# Patient Record
Sex: Male | Born: 1960 | Race: White | Hispanic: No | Marital: Married | Smoking: Former smoker
Health system: Southern US, Community
[De-identification: ages and names within clinical notes are randomized; demographics above are authoritative.]

## PROBLEM LIST (undated history)

## (undated) DIAGNOSIS — C4359 Malignant melanoma of other part of trunk: Secondary | ICD-10-CM

## (undated) HISTORY — PX: MOLE REMOVAL: SHX2046

## (undated) HISTORY — PX: SALIVARY GLAND SURGERY: SHX768

## (undated) HISTORY — DX: Malignant melanoma of other part of trunk: C43.59

---

## 2000-09-20 ENCOUNTER — Inpatient Hospital Stay (HOSPITAL_COMMUNITY): Admission: EM | Admit: 2000-09-20 | Discharge: 2000-09-21 | Payer: Self-pay | Admitting: Emergency Medicine

## 2020-01-06 DIAGNOSIS — C4359 Malignant melanoma of other part of trunk: Secondary | ICD-10-CM

## 2020-01-11 ENCOUNTER — Encounter: Payer: Self-pay | Admitting: Oncology

## 2020-01-11 DIAGNOSIS — C792 Secondary malignant neoplasm of skin: Secondary | ICD-10-CM | POA: Insufficient documentation

## 2020-01-11 DIAGNOSIS — C7931 Secondary malignant neoplasm of brain: Secondary | ICD-10-CM | POA: Insufficient documentation

## 2020-01-17 DIAGNOSIS — Z85820 Personal history of malignant melanoma of skin: Secondary | ICD-10-CM

## 2020-02-17 ENCOUNTER — Encounter: Payer: Self-pay | Admitting: Cardiology

## 2020-02-17 DIAGNOSIS — I4891 Unspecified atrial fibrillation: Secondary | ICD-10-CM

## 2020-02-17 DIAGNOSIS — R030 Elevated blood-pressure reading, without diagnosis of hypertension: Secondary | ICD-10-CM

## 2020-02-18 ENCOUNTER — Telehealth: Payer: Self-pay

## 2020-02-18 MED ORDER — METOPROLOL SUCCINATE ER 25 MG PO TB24: 12.5000 mg | ORAL_TABLET | Freq: Every day | ORAL | 3 refills | Status: DC

## 2020-02-18 NOTE — Telephone Encounter (Signed)
Left message for patient to return call.

## 2020-02-18 NOTE — Telephone Encounter (Signed)
We have no records in the chart of this patient has he been seen by [place]?

## 2020-02-18 NOTE — Telephone Encounter (Signed)
Spoke to patient and patients wife just now and let them know that per Dr. Joya Gaskins verbal order after reviewing [facility] records the metoprolol 12.5 mg daily at bedtime has been sent in for the patient. He also has an appointment to follow up with Dr. Harriet Masson on Wednesday.   Encouraged patient to call back with any questions or concerns.

## 2020-02-18 NOTE — Telephone Encounter (Signed)
Call acme in from patients wife [name] [phone]).  He was in yesterday for port placement and Dr. Harriet Masson was going to call in a medication for his BP.  I do not se anything listed in his chart.  Will you please advise as to what medication this is please?

## 2020-02-18 NOTE — Telephone Encounter (Signed)
We do not admit, follow instructions from hospital discharge, FU with Dr Harriet Masson Monday re message.

## 2020-02-19 DIAGNOSIS — C799 Secondary malignant neoplasm of unspecified site: Secondary | ICD-10-CM | POA: Diagnosis not present

## 2020-02-19 DIAGNOSIS — J189 Pneumonia, unspecified organism: Secondary | ICD-10-CM | POA: Diagnosis not present

## 2020-02-19 DIAGNOSIS — I119 Hypertensive heart disease without heart failure: Secondary | ICD-10-CM | POA: Diagnosis not present

## 2020-02-19 DIAGNOSIS — I4819 Other persistent atrial fibrillation: Secondary | ICD-10-CM | POA: Diagnosis not present

## 2020-02-20 ENCOUNTER — Encounter: Payer: Self-pay | Admitting: Oncology

## 2020-02-20 DIAGNOSIS — I4819 Other persistent atrial fibrillation: Secondary | ICD-10-CM | POA: Diagnosis not present

## 2020-02-20 DIAGNOSIS — C799 Secondary malignant neoplasm of unspecified site: Secondary | ICD-10-CM | POA: Diagnosis not present

## 2020-02-20 DIAGNOSIS — I429 Cardiomyopathy, unspecified: Secondary | ICD-10-CM

## 2020-02-20 DIAGNOSIS — C439 Malignant melanoma of skin, unspecified: Secondary | ICD-10-CM | POA: Insufficient documentation

## 2020-02-20 DIAGNOSIS — C4359 Malignant melanoma of other part of trunk: Secondary | ICD-10-CM

## 2020-02-20 DIAGNOSIS — J189 Pneumonia, unspecified organism: Secondary | ICD-10-CM | POA: Diagnosis not present

## 2020-02-20 DIAGNOSIS — I119 Hypertensive heart disease without heart failure: Secondary | ICD-10-CM | POA: Diagnosis not present

## 2020-02-20 HISTORY — DX: Malignant melanoma of other part of trunk: C43.59

## 2020-02-21 DIAGNOSIS — J189 Pneumonia, unspecified organism: Secondary | ICD-10-CM | POA: Diagnosis not present

## 2020-02-21 DIAGNOSIS — C799 Secondary malignant neoplasm of unspecified site: Secondary | ICD-10-CM | POA: Diagnosis not present

## 2020-02-21 DIAGNOSIS — I4819 Other persistent atrial fibrillation: Secondary | ICD-10-CM | POA: Diagnosis not present

## 2020-02-21 DIAGNOSIS — I119 Hypertensive heart disease without heart failure: Secondary | ICD-10-CM | POA: Diagnosis not present

## 2020-02-22 DIAGNOSIS — I429 Cardiomyopathy, unspecified: Secondary | ICD-10-CM

## 2020-02-22 DIAGNOSIS — I4891 Unspecified atrial fibrillation: Secondary | ICD-10-CM

## 2020-02-22 DIAGNOSIS — I119 Hypertensive heart disease without heart failure: Secondary | ICD-10-CM

## 2020-02-22 DIAGNOSIS — C799 Secondary malignant neoplasm of unspecified site: Secondary | ICD-10-CM

## 2020-02-23 ENCOUNTER — Ambulatory Visit: Payer: Self-pay | Admitting: Cardiology

## 2020-02-23 DIAGNOSIS — I4819 Other persistent atrial fibrillation: Secondary | ICD-10-CM | POA: Diagnosis not present

## 2020-02-23 DIAGNOSIS — I119 Hypertensive heart disease without heart failure: Secondary | ICD-10-CM | POA: Diagnosis not present

## 2020-02-23 DIAGNOSIS — C799 Secondary malignant neoplasm of unspecified site: Secondary | ICD-10-CM | POA: Diagnosis not present

## 2020-02-23 DIAGNOSIS — J189 Pneumonia, unspecified organism: Secondary | ICD-10-CM | POA: Diagnosis not present

## 2020-02-24 ENCOUNTER — Telehealth: Payer: Self-pay | Admitting: Cardiology

## 2020-02-24 NOTE — Telephone Encounter (Signed)
Pt c/o medication issue:  1. Name of Medication: Farxiga  2. How are you currently taking this medication (dosage and times per day)? n/a  3. Are you having a reaction (difficulty breathing--STAT)? no  4. What is your medication issue? Patient's wife states that Dr. Bettina Gavia wants the patient on this medication. She states that [employer] will not pay for it unless Dr. Bettina Gavia calls in and states that the patient needs to take this medication. Phone number [phone]

## 2020-02-24 NOTE — Telephone Encounter (Signed)
Attempted to call patient no answer and voicemail not set up.

## 2020-02-25 NOTE — Telephone Encounter (Signed)
Left message for patient to return call.

## 2020-02-25 NOTE — Telephone Encounter (Signed)
Pt's wife Cecille Rubin, calling stating that Dr. Bettina Gavia saw pt in [facility] and pt was put on 9 different medications. These medications are not listed on pt's medication list. The medications are Doristine Devoid, cardizem, doxycyline, Eliquis, Digoxin, metoprolol Tartrate, oxycodone and Tessalon. Pt's wife also stated that pt was given coupons for Minus Liberty and Delene Loll and the pt's pharmacy will not honor these coupons. Pt's wife would like a call concerning this matter at [phone]. Please address

## 2020-02-27 ENCOUNTER — Encounter: Payer: Self-pay | Admitting: Pharmacist

## 2020-02-27 DIAGNOSIS — C4359 Malignant melanoma of other part of trunk: Secondary | ICD-10-CM

## 2020-02-29 ENCOUNTER — Other Ambulatory Visit: Payer: Self-pay

## 2020-02-29 ENCOUNTER — Telehealth: Payer: Self-pay

## 2020-02-29 ENCOUNTER — Ambulatory Visit (INDEPENDENT_AMBULATORY_CARE_PROVIDER_SITE_OTHER): Payer: 59 | Admitting: Cardiology

## 2020-02-29 VITALS — BP 132/80 | HR 68 | Ht 63.0 in | Wt 163.4 lb

## 2020-02-29 DIAGNOSIS — R931 Abnormal findings on diagnostic imaging of heart and coronary circulation: Secondary | ICD-10-CM

## 2020-02-29 DIAGNOSIS — I4891 Unspecified atrial fibrillation: Secondary | ICD-10-CM

## 2020-02-29 DIAGNOSIS — R0989 Other specified symptoms and signs involving the circulatory and respiratory systems: Secondary | ICD-10-CM

## 2020-02-29 HISTORY — DX: Other specified symptoms and signs involving the circulatory and respiratory systems: R09.89

## 2020-02-29 HISTORY — DX: Abnormal findings on diagnostic imaging of heart and coronary circulation: R93.1

## 2020-02-29 HISTORY — DX: Unspecified atrial fibrillation: I48.91

## 2020-02-29 NOTE — Progress Notes (Signed)
Cardiology Office Note:    Date:  02/29/2020   ID:  Adam Cords., DOB Sep 11, 1960, MRN [id number]  PCP:  Patient, No Pcp Per  Cardiologist:  Adam Salines, DO  Electrophysiologist:  None   Referring MD: No ref. provider found   Follow up from hospitalization.  History of Present Illness:    Adam Furtick. is a 59 y.o. 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  Patient was seen here in the hospital after he developed fever postchemotherapy.  He stayed couple days in the hospital as he also did have some evidence of trace pleural effusion on his chest x-ray.  While in the hospital his medication was titrated and he was optimized with guideline directed therapy to Entresto 24/26 mg twice daily, Farxiga 10 mg daily, metoprolol tartrate 50 mg twice daily.  He also was continued on Cardizem, digoxin was added to his regimen for rate control and Eliquis for anticoagulation.  Patient is wife here today and they tell me that he has been doing well from a cardiovascular standpoint but unfortunately some of his medication had been denied by the insurance company.  He denies any chest pain, shortness of breath, lightheadedness or dizziness.  Past Medical History:  Diagnosis Date  . 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 MOLE REMOVAL    . SALIVARY GLAND SURGERY      Current Medications: Current Meds  Medication Sig  . benzonatate (TESSALON) 100 MG capsule Take 100 mg by mouth 3 (three) times daily as needed.  Marland Kitchen dexamethasone (DECADRON) 4 MG tablet Take 4 mg by mouth 2 (two) times daily.  . digoxin (LANOXIN) 0.125 MG tablet Take 125 mcg by mouth daily.  Marland Kitchen diltiazem (CARDIZEM) 30 MG tablet Take by mouth 3 (three) times  daily.  Marland Kitchen doxycycline (VIBRA-TABS) 100 MG tablet Take 100 mg by mouth 2 (two) times daily.  Marland Kitchen ELIQUIS 2.5 MG TABS tablet Take 2.5 mg by mouth 2 (two) times daily.  Marland Kitchen HYDROcodone-acetaminophen (NORCO/VICODIN) 5-325 MG tablet Take 1 tablet by mouth every 4 (four) hours as needed. for pain  . metoprolol tartrate (LOPRESSOR) 50 MG tablet Take 50 mg by mouth 2 (two) times daily.  Marland Kitchen oxyCODONE (OXY IR/ROXICODONE) 5 MG immediate release tablet Take 5 mg by mouth every 8 (eight) hours as needed.     Allergies:   Patient has no known allergies.   Social History   Socioeconomic History  . Marital status: Married    Spouse name: Not on file  . Number of children: Not on file  . Years of education: Not on file  . Highest education level: Not on file  Occupational History  . Not on file  Tobacco Use  . Smoking status: Former Research scientist (life sciences)  . Smokeless tobacco: Never Used  Substance and Sexual Activity  . Alcohol use: Not on file  . Drug use: Not on file  . Sexual activity: Not on file  Other Topics Concern  . Not on file  Social History Narrative  . Not on file   Social Determinants of Health   Financial Resource Strain:   . Difficulty of Paying Living Expenses: Not on file  Food Insecurity:   . Worried About Charity fundraiser in the  Last Year: Not on file  . Ran Out of Food in the Last Year: Not on file  Transportation Needs:   . Lack of Transportation (Medical): Not on file  . Lack of Transportation (Non-Medical): Not on file  Physical Activity:   . Days of Exercise per Week: Not on file  . Minutes of Exercise per Session: Not on file  Stress:   . Feeling of Stress : Not on file  Social Connections:   . Frequency of Communication with Friends and Family: Not on file  . Frequency of Social Gatherings with Friends and Family: Not on file  . Attends Religious Services: Not on file  . Active Member of Clubs or Organizations: Not on file  . Attends Archivist Meetings: Not on  file  . Marital Status: Not on file     Family History: The patient's family history includes Hypertension in his father and mother.  ROS:   Review of Systems  Constitution: Negative for decreased appetite, fever and weight gain.  HENT: Negative for congestion, ear discharge, hoarse voice and sore throat.   Eyes: Negative for discharge, redness, vision loss in right eye and visual halos.  Cardiovascular: Negative for chest pain, dyspnea on exertion, leg swelling, orthopnea and palpitations.  Respiratory: Negative for cough, hemoptysis, shortness of breath and snoring.   Endocrine: Negative for heat intolerance and polyphagia.  Hematologic/Lymphatic: Negative for bleeding problem. Does not bruise/bleed easily.  Skin: Negative for flushing, nail changes, rash and suspicious lesions.  Musculoskeletal: Negative for arthritis, joint pain, muscle cramps, myalgias, neck pain and stiffness.  Gastrointestinal: Negative for abdominal pain, bowel incontinence, diarrhea and excessive appetite.  Genitourinary: Negative for decreased libido, genital sores and incomplete emptying.  Neurological: Negative for brief paralysis, focal weakness, headaches and loss of balance.  Psychiatric/Behavioral: Negative for altered mental status, depression and suicidal ideas.  Allergic/Immunologic: Negative for HIV exposure and persistent infections.    EKGs/Labs/Other Studies Reviewed:    The following studies were reviewed today:   EKG: None today  Echocardiogram done at [facility] in September 2020 showed moderate impaired LV function 35 to 40%.  Right ventricle was normal in size and function.  Left atrium is normal in size.  Right atrium normal in size and function.  Mild aortic valve sclerosis.  Trace mitral regurgitation.  Trace tricuspid regurgitation.  Aortic root, ascending aorta all within normal.  Recent Labs: No results found for requested labs within last 8760 hours.  Recent Lipid  Panel No results found for: CHOL, TRIG, HDL, CHOLHDL, VLDL, LDLCALC, LDLDIRECT  Physical Exam:    VS:  BP 132/80   Pulse 68   Ht 5\' 3"  (1.6 m)   Wt 163 lb 6.4 oz (74.1 kg)   SpO2 96%   BMI 28.95 kg/m     Wt Readings from Last 3 Encounters:  02/29/20 163 lb 6.4 oz (74.1 kg)  02/27/20 162 lb (73.5 kg)     GEN: Well nourished, well developed in no acute distress HEENT: Normal NECK: No JVD; No carotid bruits LYMPHATICS: No lymphadenopathy CARDIAC: S1S2 noted,RRR, no murmurs, rubs, gallops RESPIRATORY:  Clear to auscultation without rales, wheezing or rhonchi  ABDOMEN: Soft, non-tender, non-distended, +bowel sounds, no guarding. EXTREMITIES: No edema, No cyanosis, no clubbing MUSCULOSKELETAL:  No deformity  SKIN: Warm and dry NEUROLOGIC:  Alert and oriented x 3, non-focal PSYCHIATRIC:  Normal affect, good insight  ASSESSMENT:    1. Atrial fibrillation, unspecified type ([place])   2. Depressed left ventricular ejection  fraction    PLAN:    The patient is currently on metoprolol tartrate 50 mg twice daily, Entresto 24-26 as well as Farxiga 10 milligrams daily I like to continue this medication for the patient.  Unfortunately his insurance company has pushed back on approving this medication.  I did speak to representative for his insurance company and they are going to call us back with information either on his approval or appeal process.  I have asked the patient to take his blood pressure at home daily as well.  In terms of his atrial fibrillation we will continue him on his current regimen rate control would be our strategy at this time.  Patient actively undergoing chemotherapy.  Plan for echocardiogram in 3 months once the patient is at his optimal titrated doses.  Blood work will be done today which includes digoxin level.  The patient is in agreement with the above plan. The patient left the office in stable condition.  The patient will follow up in 6 or sooner if  needed.   Medication Adjustments/Labs and Tests Ordered: Current medicines are reviewed at length with the patient today.  Concerns regarding medicines are outlined above.  Orders Placed This Encounter  Procedures  . Basic metabolic panel  . Magnesium  . Digoxin level   No orders of the defined types were placed in this encounter.   Patient Instructions  Medication Instructions:  No medication changes. *If you need a refill on your cardiac medications before your next appointment, please call your pharmacy*   Lab Work: Your physician recommends that you have labs done in the office today. Your test included  basic metabolic panel, digoxin and magnesium.  If you have labs (blood work) drawn today and your tests are completely normal, you will receive your results only by: Marland Kitchen MyChart Message (if you have MyChart) OR . A paper copy in the mail If you have any lab test that is abnormal or we need to change your treatment, we will call you to review the results.   Testing/Procedures: None ordered   Follow-Up: At [facility], you and your health needs are our priority.  As part of our continuing mission to provide you with exceptional heart care, we have created designated Provider Care Teams.  These Care Teams include your primary Cardiologist (physician) and Advanced Practice Providers (APPs -  Physician Assistants and Nurse Practitioners) who all work together to provide you with the care you need, when you need it.  We recommend signing up for the patient portal called "MyChart".  Sign up information is provided on this After Visit Summary.  MyChart is used to connect with patients for Virtual Visits (Telemedicine).  Patients are able to view lab/test results, encounter notes, upcoming appointments, etc.  Non-urgent messages can be sent to your provider as well.   To learn more about what you can do with MyChart, go to [email or web address].    Your next appointment:   6  week(s)  The format for your next appointment:   In Person  Provider:   Berniece Salines, DO   Other Instructions NA     Adopting a Healthy Lifestyle.  Know what a healthy weight is for you (roughly BMI <25) and aim to maintain this   Aim for 7+ servings of fruits and vegetables daily   65-80+ fluid ounces of water or unsweet tea for healthy kidneys   Limit to max 1 drink of alcohol per day; avoid smoking/tobacco   Limit animal  fats in diet for cholesterol and heart health - choose grass fed whenever available   Avoid highly processed foods, and foods high in saturated/trans fats   Aim for low stress - take time to unwind and care for your mental health   Aim for 150 min of moderate intensity exercise weekly for heart health, and weights twice weekly for bone health   Aim for 7-9 hours of sleep daily   When it comes to diets, agreement about the perfect plan isnt easy to find, even among the experts. Experts at the [employer] [employer] developed an idea known as the Healthy Eating Plate. Just imagine a plate divided into logical, healthy portions.   The emphasis is on diet quality:   Load up on vegetables and fruits - one-half of your plate: Aim for color and variety, and remember that potatoes dont count.   Go for whole grains - one-quarter of your plate: Whole wheat, barley, wheat berries, quinoa, oats, brown rice, and foods made with them. If you want pasta, go with whole wheat pasta.   Protein power - one-quarter of your plate: Fish, chicken, beans, and nuts are all healthy, versatile protein sources. Limit red meat.   The diet, however, does go beyond the plate, offering a few other suggestions.   Use healthy plant oils, such as olive, canola, soy, corn, sunflower and peanut. Check the labels, and avoid partially hydrogenated oil, which have unhealthy trans fats.   If youre thirsty, drink water. Coffee and tea are good in moderation, but skip sugary  drinks and limit milk and dairy products to one or two daily servings.   The type of carbohydrate in the diet is more important than the amount. Some sources of carbohydrates, such as vegetables, fruits, whole grains, and beans-are healthier than others.   Finally, stay active  Signed, Adam Salines, DO  02/29/2020 4:36 PM    [facility]

## 2020-02-29 NOTE — Telephone Encounter (Signed)
Merry Lofty aware that the PA was denied and that Dr. Harriet Masson has spoke with the insurance company.

## 2020-02-29 NOTE — Patient Instructions (Signed)
Medication Instructions:  No medication changes. *If you need a refill on your cardiac medications before your next appointment, please call your pharmacy*   Lab Work: Your physician recommends that you have labs done in the office today. Your test included  basic metabolic panel, digoxin and magnesium.  If you have labs (blood work) drawn today and your tests are completely normal, you will receive your results only by: Marland Kitchen MyChart Message (if you have MyChart) OR . A paper copy in the mail If you have any lab test that is abnormal or we need to change your treatment, we will call you to review the results.   Testing/Procedures: None ordered   Follow-Up: At [facility], you and your health needs are our priority.  As part of our continuing mission to provide you with exceptional heart care, we have created designated Provider Care Teams.  These Care Teams include your primary Cardiologist (physician) and Advanced Practice Providers (APPs -  Physician Assistants and Nurse Practitioners) who all work together to provide you with the care you need, when you need it.  We recommend signing up for the patient portal called "MyChart".  Sign up information is provided on this After Visit Summary.  MyChart is used to connect with patients for Virtual Visits (Telemedicine).  Patients are able to view lab/test results, encounter notes, upcoming appointments, etc.  Non-urgent messages can be sent to your provider as well.   To learn more about what you can do with MyChart, go to [email or web address].    Your next appointment:   6 week(s)  The format for your next appointment:   In Person  Provider:   Berniece Salines, DO   Other Instructions NA

## 2020-03-01 LAB — BASIC METABOLIC PANEL WITH GFR
BUN/Creatinine Ratio: 14 (ref 9–20)
BUN: 12 mg/dL (ref 6–24)
CO2: 24 mmol/L (ref 20–29)
Calcium: 9.2 mg/dL (ref 8.7–10.2)
Chloride: 110 mmol/L — ABNORMAL HIGH (ref 96–106)
Creatinine, Ser: 0.87 mg/dL (ref 0.76–1.27)
GFR calc Af Amer: 109 mL/min/{1.73_m2}
GFR calc non Af Amer: 94 mL/min/{1.73_m2}
Glucose: 87 mg/dL (ref 65–99)
Potassium: 4.5 mmol/L (ref 3.5–5.2)
Sodium: 145 mmol/L — ABNORMAL HIGH (ref 134–144)

## 2020-03-01 LAB — DIGOXIN LEVEL: Digoxin, Serum: 0.5 ng/mL (ref 0.5–0.9)

## 2020-03-01 LAB — MAGNESIUM: Magnesium: 2 mg/dL (ref 1.6–2.3)

## 2020-03-01 NOTE — Telephone Encounter (Signed)
Jade with [employer] is calling to follow up regarding PA for [place]. Please return call to discuss further at [phone].

## 2020-03-01 NOTE — Telephone Encounter (Signed)
Adam Bender is with med management with [facility] called in stated she would be able help assist with the PA   Best number [phone] ext [phone]

## 2020-03-02 ENCOUNTER — Telehealth: Payer: Self-pay

## 2020-03-02 MED ORDER — EMPAGLIFLOZIN 10 MG PO TABS: 10.0000 mg | ORAL_TABLET | Freq: Every day | ORAL | 6 refills | Status: DC

## 2020-03-02 NOTE — Addendum Note (Signed)
Addended by: Truddie Hidden on: 03/02/2020 03:20 PM   Modules accepted: Orders

## 2020-03-02 NOTE — Telephone Encounter (Signed)
Spoke with Adam Bender who states that we need to fax notes and echo to [phone] for the [facility] EOC# [id number]. Dr. Harriet Masson is ok with pt being on Jardiance instead of farxiga. Records printed and faxed. Called pt and made her aware of same.

## 2020-03-02 NOTE — Telephone Encounter (Signed)
Follow Up:     Returning Adam Bender's call from yesterday. Please call in the next hour, if not call afrom 2:00 to 3:00 please.

## 2020-03-03 NOTE — Telephone Encounter (Signed)
See additional encounters.

## 2020-03-09 ENCOUNTER — Other Ambulatory Visit: Payer: Self-pay | Admitting: Hematology and Oncology

## 2020-03-09 DIAGNOSIS — C7931 Secondary malignant neoplasm of brain: Secondary | ICD-10-CM

## 2020-03-09 DIAGNOSIS — C4359 Malignant melanoma of other part of trunk: Secondary | ICD-10-CM

## 2020-03-09 LAB — CBC AND DIFFERENTIAL
HCT: 45 (ref 41–53)
Hemoglobin: 14.9 (ref 13.5–17.5)
Neutrophils Absolute: 7340
Platelets: 349 (ref 150–399)
WBC: 13.6

## 2020-03-09 LAB — HEPATIC FUNCTION PANEL
ALT: 38 (ref 10–40)
AST: 31 (ref 14–40)
Alkaline Phosphatase: 123 (ref 25–125)
Bilirubin, Total: 0.1

## 2020-03-09 LAB — TSH: TSH: 1.15 (ref 0.41–5.90)

## 2020-03-09 LAB — CBC: RBC: 4.97 (ref 3.87–5.11)

## 2020-03-09 LAB — BASIC METABOLIC PANEL
BUN: 12 (ref 4–21)
CO2: 22 (ref 13–22)
Chloride: 107 (ref 99–108)
Creatinine: 1 (ref 0.6–1.3)
Glucose: 161
Potassium: 3.9 (ref 3.4–5.3)
Sodium: 141 (ref 137–147)

## 2020-03-09 LAB — COMPREHENSIVE METABOLIC PANEL
Albumin: 4.1 (ref 3.5–5.0)
Calcium: 9.4 (ref 8.7–10.7)

## 2020-03-10 ENCOUNTER — Inpatient Hospital Stay: Payer: 59 | Attending: Oncology

## 2020-03-10 ENCOUNTER — Other Ambulatory Visit: Payer: Self-pay

## 2020-03-10 VITALS — BP 130/76 | HR 52 | Temp 98.2°F | Resp 18 | Ht 68.0 in | Wt 161.8 lb

## 2020-03-10 DIAGNOSIS — Z5112 Encounter for antineoplastic immunotherapy: Secondary | ICD-10-CM | POA: Diagnosis not present

## 2020-03-10 DIAGNOSIS — C792 Secondary malignant neoplasm of skin: Secondary | ICD-10-CM | POA: Insufficient documentation

## 2020-03-10 DIAGNOSIS — C4359 Malignant melanoma of other part of trunk: Secondary | ICD-10-CM | POA: Insufficient documentation

## 2020-03-10 MED ORDER — SODIUM CHLORIDE 0.9 % IV SOLN
1.0000 mg/kg | Freq: Once | INTRAVENOUS | Status: AC
  Administered 2020-03-10: 74 mg via INTRAVENOUS
  Filled 2020-03-10: qty 7.4

## 2020-03-10 MED ORDER — DIPHENHYDRAMINE HCL 50 MG/ML IJ SOLN
25.0000 mg | Freq: Once | INTRAMUSCULAR | Status: AC
  Administered 2020-03-10: 25 mg via INTRAVENOUS

## 2020-03-10 MED ORDER — SODIUM CHLORIDE 0.9 % IV SOLN
Freq: Once | INTRAVENOUS | Status: AC
  Filled 2020-03-10: qty 250

## 2020-03-10 MED ORDER — FAMOTIDINE IN NACL 20-0.9 MG/50ML-% IV SOLN
INTRAVENOUS | Status: AC
  Filled 2020-03-10: qty 50

## 2020-03-10 MED ORDER — FAMOTIDINE IN NACL 20-0.9 MG/50ML-% IV SOLN
20.0000 mg | Freq: Once | INTRAVENOUS | Status: AC
  Administered 2020-03-10: 20 mg via INTRAVENOUS

## 2020-03-10 MED ORDER — HEPARIN SOD (PORK) LOCK FLUSH 100 UNIT/ML IV SOLN
500.0000 [IU] | Freq: Once | INTRAVENOUS | Status: AC | PRN
  Administered 2020-03-10: 500 [IU]
  Filled 2020-03-10: qty 5

## 2020-03-10 MED ORDER — SODIUM CHLORIDE 0.9 % IV SOLN
3.0000 mg/kg | Freq: Once | INTRAVENOUS | Status: AC
  Administered 2020-03-10: 220 mg via INTRAVENOUS
  Filled 2020-03-10: qty 40

## 2020-03-10 MED ORDER — SODIUM CHLORIDE 0.9% FLUSH
10.0000 mL | INTRAVENOUS | Status: DC | PRN
  Administered 2020-03-10: 10 mL
  Filled 2020-03-10: qty 10

## 2020-03-10 MED ORDER — DIPHENHYDRAMINE HCL 50 MG/ML IJ SOLN
INTRAMUSCULAR | Status: AC
  Filled 2020-03-10: qty 1

## 2020-03-10 NOTE — Patient Instructions (Signed)
[facility] Discharge Instructions for Patients Receiving Chemotherapy  Today you received the following chemotherapy agents:  Ipilimumab - Nivolumab  To help prevent nausea and vomiting after your treatment, we encourage you to take your nausea medication.   If you develop nausea and vomiting that is not controlled by your nausea medication, call the clinic.   BELOW ARE SYMPTOMS THAT SHOULD BE REPORTED IMMEDIATELY:  *FEVER GREATER THAN 100.5 F  *CHILLS WITH OR WITHOUT FEVER  NAUSEA AND VOMITING THAT IS NOT CONTROLLED WITH YOUR NAUSEA MEDICATION  *UNUSUAL SHORTNESS OF BREATH  *UNUSUAL BRUISING OR BLEEDING  TENDERNESS IN MOUTH AND THROAT WITH OR WITHOUT PRESENCE OF ULCERS  *URINARY PROBLEMS  *BOWEL PROBLEMS  UNUSUAL RASH Items with * indicate a potential emergency and should be followed up as soon as possible.  Feel free to call the clinic should you have any questions or concerns at The clinic phone number is [phone].  Please show the [place] at check-in to the Emergency Department and triage nurse.

## 2020-03-10 NOTE — Progress Notes (Signed)
Pt is stable at discharge. 

## 2020-03-20 ENCOUNTER — Telehealth: Payer: Self-pay | Admitting: Cardiology

## 2020-03-20 NOTE — Telephone Encounter (Signed)
Patients wife is requesting husband to switch from Dr. Harriet Masson to Dr. Bettina Gavia.

## 2020-03-20 NOTE — Telephone Encounter (Signed)
Okay, I assume this is another member of the [place] family and I have taken care of nearly all of them in my career

## 2020-03-20 NOTE — Telephone Encounter (Signed)
That will be fine with me. 

## 2020-03-23 NOTE — Progress Notes (Signed)
Cardiology Office Note:    Date:  03/24/2020   ID:  Adam Cords., DOB 03/22/1961, MRN [id number]  PCP:  Patient, No Pcp Per  Cardiologist:  Shirlee More, MD    Referring MD: No ref. provider found    ASSESSMENT:    1. Chronic systolic heart failure (HCC)   2. Persistent atrial fibrillation ([place])   3. Chronic anticoagulation   4. High risk medication use    PLAN:    In order of problems listed above:  1. he has developed decompensated heart failure since his hospitalization initiated diuretic recheck labs including proBNP initiate Entresto. 2. Rate controlled continue current treatment including digoxin check level his cardiomyopathy is due to atrial fibrillation I reinforced and the benefit of resuming sinus rhythm and I had asked him to come with a decision next visit continue his anticoagulant 3. Continue digoxin no evidence of toxicity   Next appointment: 3 weeks   Medication Adjustments/Labs and Tests Ordered: Current medicines are reviewed at length with the patient today.  Concerns regarding medicines are outlined above.  No orders of the defined types were placed in this encounter.  Meds ordered this encounter  Medications  . furosemide (LASIX) 20 MG tablet    Sig: Take 1 tablet (20 mg total) by mouth daily.    Dispense:  90 tablet    Refill:  3  . sacubitril-valsartan (ENTRESTO) 24-26 MG    Sig: Take 1 tablet by mouth 2 (two) times daily.    Dispense:  60 tablet    Refill:  3    Chief Complaint  Patient presents with  . Follow-up  . Atrial Fibrillation  . Cardiomyopathy    History of Present Illness:    Adam Grundman. is a 59 y.o. male with a hx of atrial fibrillation with cardiomyopathy EF 35 to 40%.  He has a history of melanoma and is receiving chemotherapy.He was  last seen 02/29/2020.  Compliance with diet, lifestyle and medications: Yes  Unfortunately never had preapproval for Entresto and has not taken valsartan.  His weight  is up 3 to 5 pounds he has peripheral edema he is short of breath he is in obvious decompensated heart failure he will start a loop diuretic and initiate Entresto with a new preapproval for chronic systolic heart failure.  Remains in atrial fibrillation hesitant except cardioversion strongly encouraged him to do that and when I see him back in 3 weeks in follow-up hopefully can get him scheduled he is compliant with his anticoagulant without bleeding complication has frequent labs at the cancer center and will ask for him to have a BMP proBNP digoxin level with his next labs on Thursday of next week.  No chest pain palpitation or syncope.  Echocardiogram done at [facility] in September 2021 showed moderate impaired LV function 35 to 40%. Right ventricle was normal in size and function.  Left atrium is normal in size.  Right atrium normal in size and function.  Mild aortic valve sclerosis.  Trace mitral regurgitation.  Trace tricuspid regurgitation.  Aortic root, ascending aorta all within normal.   Past Medical History:  Diagnosis Date  . Atrial fibrillation ([place]) 02/29/2020  . Depressed left ventricular ejection fraction 02/29/2020  . 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 MOLE REMOVAL    . SALIVARY GLAND SURGERY      Current Medications: Current Meds  Medication Sig  .  benzonatate (TESSALON) 100 MG capsule Take 100 mg by mouth 3 (three) times daily as needed.  . digoxin (LANOXIN) 0.125 MG tablet Take 125 mcg by mouth daily.  Marland Kitchen diltiazem (CARDIZEM) 30 MG tablet Take by mouth 3 (three) times daily.  Marland Kitchen ELIQUIS 2.5 MG TABS tablet Take 2.5 mg by mouth 2 (two) times daily.  . empagliflozin (JARDIANCE) 10 MG TABS tablet Take 1 tablet (10 mg total) by mouth daily before breakfast.  . HYDROcodone-acetaminophen (NORCO/VICODIN) 5-325 MG tablet Take 1 tablet by mouth every 4 (four) hours as needed. for pain  . metoprolol  tartrate (LOPRESSOR) 50 MG tablet Take 50 mg by mouth 2 (two) times daily.  Marland Kitchen oxyCODONE (OXY IR/ROXICODONE) 5 MG immediate release tablet Take 5 mg by mouth every 8 (eight) hours as needed.     Allergies:   Patient has no known allergies.   Social History   Socioeconomic History  . Marital status: Married    Spouse name: Not on file  . Number of children: Not on file  . Years of education: Not on file  . Highest education level: Not on file  Occupational History  . Not on file  Tobacco Use  . Smoking status: Former Research scientist (life sciences)  . Smokeless tobacco: Never Used  Substance and Sexual Activity  . Alcohol use: Not on file  . Drug use: Not on file  . Sexual activity: Not on file  Other Topics Concern  . Not on file  Social History Narrative  . Not on file   Social Determinants of Health   Financial Resource Strain:   . Difficulty of Paying Living Expenses: Not on file  Food Insecurity:   . Worried About Charity fundraiser in the Last Year: Not on file  . Ran Out of Food in the Last Year: Not on file  Transportation Needs:   . Lack of Transportation (Medical): Not on file  . Lack of Transportation (Non-Medical): Not on file  Physical Activity:   . Days of Exercise per Week: Not on file  . Minutes of Exercise per Session: Not on file  Stress:   . Feeling of Stress : Not on file  Social Connections:   . Frequency of Communication with Friends and Family: Not on file  . Frequency of Social Gatherings with Friends and Family: Not on file  . Attends Religious Services: Not on file  . Active Member of Clubs or Organizations: Not on file  . Attends Archivist Meetings: Not on file  . Marital Status: Not on file     Family History: The patient's family history includes Hypertension in his father and mother. ROS:   Please see the history of present illness.    All other systems reviewed and are negative.  EKGs/Labs/Other Studies Reviewed:    The following studies  were reviewed today:    Recent Labs: 02/29/2020: Magnesium 2.0 03/09/2020: ALT 38; BUN 12; Creatinine 1.0; Hemoglobin 14.9; Platelets 349; Potassium 3.9; Sodium 141; TSH 1.15  Recent Lipid Panel No results found for: CHOL, TRIG, HDL, CHOLHDL, VLDL, LDLCALC, LDLDIRECT  Physical Exam:    VS:  BP 122/74 (BP Location: Right Arm, Patient Position: Sitting, Cuff Size: Normal)   Pulse 92   Ht 5\' 3"  (1.6 m)   Wt 166 lb 12.8 oz (75.7 kg)   SpO2 97%   BMI 29.55 kg/m     Wt Readings from Last 3 Encounters:  03/24/20 166 lb 12.8 oz (75.7 kg)  03/20/20 162 lb  9 oz (73.7 kg)  03/10/20 161 lb 12.8 oz (73.4 kg)     GEN:  Well nourished, well developed in no acute distress HEENT: Normal NECK: No JVD; No carotid bruits LYMPHATICS: No lymphadenopathy CARDIAC: Irregular rhythm S1 variable RRR, no murmurs, rubs, gallops RESPIRATORY:  Clear to auscultation without rales, wheezing or rhonchi  ABDOMEN: Soft, non-tender, non-distended MUSCULOSKELETAL: 2+ bilateral lower extremity ankle to knee pitting edema; No deformity  SKIN: Warm and dry NEUROLOGIC:  Alert and oriented x 3 PSYCHIATRIC:  Normal affect    Signed, Shirlee More, MD  03/24/2020 3:55 PM    [facility]

## 2020-03-24 ENCOUNTER — Ambulatory Visit: Payer: 59 | Admitting: Cardiology

## 2020-03-24 ENCOUNTER — Other Ambulatory Visit: Payer: Self-pay

## 2020-03-24 ENCOUNTER — Ambulatory Visit (INDEPENDENT_AMBULATORY_CARE_PROVIDER_SITE_OTHER): Payer: 59 | Admitting: Cardiology

## 2020-03-24 ENCOUNTER — Encounter: Payer: Self-pay | Admitting: Cardiology

## 2020-03-24 VITALS — BP 122/74 | HR 92 | Ht 63.0 in | Wt 166.8 lb

## 2020-03-24 DIAGNOSIS — I4819 Other persistent atrial fibrillation: Secondary | ICD-10-CM

## 2020-03-24 DIAGNOSIS — Z7901 Long term (current) use of anticoagulants: Secondary | ICD-10-CM

## 2020-03-24 DIAGNOSIS — I5022 Chronic systolic (congestive) heart failure: Secondary | ICD-10-CM | POA: Diagnosis not present

## 2020-03-24 DIAGNOSIS — Z79899 Other long term (current) drug therapy: Secondary | ICD-10-CM | POA: Diagnosis not present

## 2020-03-24 MED ORDER — FUROSEMIDE 20 MG PO TABS: 20.0000 mg | ORAL_TABLET | Freq: Every day | ORAL | 3 refills | Status: DC

## 2020-03-24 MED ORDER — ENTRESTO 24-26 MG PO TABS: 1.0000 | ORAL_TABLET | Freq: Two times a day (BID) | ORAL | 3 refills | Status: DC

## 2020-03-24 NOTE — Patient Instructions (Addendum)
Medication Instructions:  Your physician has recommended you make the following change in your medication:  START: Furosemide 20 mg take one tablet by mouth daily.  START: Entresto 24/26 mg take one tablet by mouth twice daily.   *If you need a refill on your cardiac medications before your next appointment, please call your pharmacy*   Lab Work: Please get your lab work completed at [facility] on Thursday.  If you have labs (blood work) drawn today and your tests are completely normal, you will receive your results only by: Marland Kitchen MyChart Message (if you have MyChart) OR . A paper copy in the mail If you have any lab test that is abnormal or we need to change your treatment, we will call you to review the results.   Testing/Procedures: None   Follow-Up: At [facility], you and your health needs are our priority.  As part of our continuing mission to provide you with exceptional heart care, we have created designated Provider Care Teams.  These Care Teams include your primary Cardiologist (physician) and Advanced Practice Providers (APPs -  Physician Assistants and Nurse Practitioners) who all work together to provide you with the care you need, when you need it.  We recommend signing up for the patient portal called "MyChart".  Sign up information is provided on this After Visit Summary.  MyChart is used to connect with patients for Virtual Visits (Telemedicine).  Patients are able to view lab/test results, encounter notes, upcoming appointments, etc.  Non-urgent messages can be sent to your provider as well.   To learn more about what you can do with MyChart, go to [email or web address].    Your next appointment:   3 week(s)  The format for your next appointment:   In Person  Provider:   Shirlee More, MD   Other Instructions

## 2020-03-28 ENCOUNTER — Telehealth: Payer: Self-pay | Admitting: *Deleted

## 2020-03-28 MED ORDER — DIGOXIN 125 MCG PO TABS
125.0000 ug | ORAL_TABLET | Freq: Every day | ORAL | 5 refills | Status: DC
Start: 2020-03-28 — End: 2020-07-24

## 2020-03-28 NOTE — Telephone Encounter (Signed)
Rx refill sent to pharmacy. 

## 2020-03-30 ENCOUNTER — Other Ambulatory Visit: Payer: Self-pay | Admitting: Hematology and Oncology

## 2020-03-30 DIAGNOSIS — C7931 Secondary malignant neoplasm of brain: Secondary | ICD-10-CM | POA: Diagnosis not present

## 2020-03-30 DIAGNOSIS — C4359 Malignant melanoma of other part of trunk: Secondary | ICD-10-CM | POA: Diagnosis not present

## 2020-03-30 LAB — HEPATIC FUNCTION PANEL
ALT: 32 (ref 10–40)
AST: 34 (ref 14–40)
Alkaline Phosphatase: 129 — AB (ref 25–125)
Bilirubin, Total: 0.7

## 2020-03-30 LAB — BASIC METABOLIC PANEL
BUN: 10 (ref 4–21)
CO2: 19 (ref 13–22)
Chloride: 110 — AB (ref 99–108)
Creatinine: 0.9 (ref 0.6–1.3)
Glucose: 107
Potassium: 4.1 (ref 3.4–5.3)
Sodium: 140 (ref 137–147)

## 2020-03-30 LAB — CBC: RBC: 5.5 — AB (ref 3.87–5.11)

## 2020-03-30 LAB — COMPREHENSIVE METABOLIC PANEL
Albumin: 4.2 (ref 3.5–5.0)
Calcium: 9.1 (ref 8.7–10.7)

## 2020-03-30 LAB — CBC AND DIFFERENTIAL
HCT: 50 (ref 41–53)
Hemoglobin: 16.3 (ref 13.5–17.5)
Neutrophils Absolute: 6.43
Platelets: 334 (ref 150–399)
WBC: 12.6

## 2020-03-30 LAB — TSH: TSH: 2.82 (ref 0.41–5.90)

## 2020-03-31 ENCOUNTER — Other Ambulatory Visit: Payer: Self-pay

## 2020-03-31 ENCOUNTER — Inpatient Hospital Stay: Payer: 59

## 2020-03-31 VITALS — BP 116/75 | HR 84 | Temp 98.2°F | Resp 18 | Ht 63.0 in | Wt 166.2 lb

## 2020-03-31 DIAGNOSIS — Z5112 Encounter for antineoplastic immunotherapy: Secondary | ICD-10-CM | POA: Diagnosis not present

## 2020-03-31 DIAGNOSIS — C4359 Malignant melanoma of other part of trunk: Secondary | ICD-10-CM

## 2020-03-31 MED ORDER — DIPHENHYDRAMINE HCL 50 MG/ML IJ SOLN
INTRAMUSCULAR | Status: AC
  Filled 2020-03-31: qty 1

## 2020-03-31 MED ORDER — SODIUM CHLORIDE 0.9 % IV SOLN
3.0000 mg/kg | Freq: Once | INTRAVENOUS | Status: AC
  Administered 2020-03-31: 220 mg via INTRAVENOUS
  Filled 2020-03-31: qty 40

## 2020-03-31 MED ORDER — FAMOTIDINE IN NACL 20-0.9 MG/50ML-% IV SOLN
20.0000 mg | Freq: Once | INTRAVENOUS | Status: AC
  Administered 2020-03-31: 20 mg via INTRAVENOUS

## 2020-03-31 MED ORDER — SODIUM CHLORIDE 0.9 % IV SOLN
Freq: Once | INTRAVENOUS | Status: AC
  Filled 2020-03-31: qty 250

## 2020-03-31 MED ORDER — SODIUM CHLORIDE 0.9% FLUSH
10.0000 mL | INTRAVENOUS | Status: DC | PRN
  Administered 2020-03-31: 10 mL
  Filled 2020-03-31: qty 10

## 2020-03-31 MED ORDER — SODIUM CHLORIDE 0.9 % IV SOLN
1.0000 mg/kg | Freq: Once | INTRAVENOUS | Status: AC
  Administered 2020-03-31: 74 mg via INTRAVENOUS
  Filled 2020-03-31: qty 7.4

## 2020-03-31 MED ORDER — HEPARIN SOD (PORK) LOCK FLUSH 100 UNIT/ML IV SOLN
500.0000 [IU] | Freq: Once | INTRAVENOUS | Status: AC | PRN
  Administered 2020-03-31: 500 [IU]
  Filled 2020-03-31: qty 5

## 2020-03-31 MED ORDER — DIPHENHYDRAMINE HCL 50 MG/ML IJ SOLN
25.0000 mg | Freq: Once | INTRAMUSCULAR | Status: AC
  Administered 2020-03-31: 25 mg via INTRAVENOUS

## 2020-03-31 MED ORDER — FAMOTIDINE IN NACL 20-0.9 MG/50ML-% IV SOLN
INTRAVENOUS | Status: AC
  Filled 2020-03-31: qty 50

## 2020-03-31 NOTE — Patient Instructions (Signed)
[place] Discharge Instructions for Patients Receiving Chemotherapy  Today you received the following chemotherapy agents Nivolumab, Ipilimumab  To help prevent nausea and vomiting after your treatment, we encourage you to take your nausea medication.Ipilimumab injection What is this medicine? IPILIMUMAB (IP i LIM ue mab) is a monoclonal antibody. It is used to treat colorectal cancer, kidney cancer, liver cancer, lung cancer, melanoma, and mesothelioma. This medicine may be used for other purposes; ask your health care provider or pharmacist if you have questions. COMMON BRAND NAME(S): YERVOY What should I tell my health care provider before I take this medicine? They need to know if you have any of these conditions:  Addison's disease  blood in your stools (black or tarry stools) or if you have blood in your vomit  eye disease, vision problems  history of pancreatitis  history of stomach bleeding  immune system problems  inflammatory bowel disease  kidney disease  liver disease  lupus  myasthenia gravis  organ transplant  rheumatoid arthritis  sarcoidosis  stomach or intestine problems  thyroid disease  tingling of the fingers or toes, or other nerve disorder  an unusual or allergic reaction to ipilimumab, other medicines, foods, dyes, or preservatives  pregnant or trying to get pregnant  breast-feeding How should I use this medicine? This medicine is for infusion into a vein. It is given by a health care professional in a hospital or clinic setting. A special MedGuide will be given to you before each treatment. Be sure to read this information carefully each time. Talk to your pediatrician regarding the use of this medicine in children. While this drug may be prescribed for children as young as 12 years for selected conditions, precautions do apply. Overdosage: If you think you have taken too much of this medicine contact a poison  control center or emergency room at once. NOTE: This medicine is only for you. Do not share this medicine with others. What if I miss a dose? It is important not to miss your dose. Call your doctor or health care professional if you are unable to keep an appointment. What may interact with this medicine? Interactions are not expected. This list may not describe all possible interactions. Give your health care provider a list of all the medicines, herbs, non-prescription drugs, or dietary supplements you use. Also tell them if you smoke, drink alcohol, or use illegal drugs. Some items may interact with your medicine. What should I watch for while using this medicine? Tell your doctor or healthcare professional if your symptoms do not start to get better or if they get worse. Do not become pregnant while taking this medicine or for 3 months after stopping it. Women should inform their doctor if they wish to become pregnant or think they might be pregnant. There is a potential for serious side effects to an unborn child. Talk to your health care professional or pharmacist for more information. Do not breast-feed an infant while taking this medicine or for 3 months after the last dose. Your condition will be monitored carefully while you are receiving this medicine. You may need blood work done while you are taking this medicine. What side effects may I notice from receiving this medicine? Side effects that you should report to your doctor or health care professional as soon as possible:  allergic reactions like skin rash, itching or hives, swelling of the face, lips, or tongue  black, tarry stools  bloody or watery diarrhea  changes in vision  dizziness  eye pain  fast, irregular heartbeat  feeling anxious  feeling faint or lightheaded, falls  nausea, vomiting  pain, tingling, numbness in the hands or feet  redness, blistering, peeling or loosening of the skin, including inside the  mouth  signs and symptoms of liver injury like dark yellow or brown urine; general ill feeling or flu-like symptoms; light-colored stools; loss of appetite; nausea; right upper belly pain; unusually weak or tired; yellowing of the eyes or skin  unusual bleeding or bruising Side effects that usually do not require medical attention (report to your doctor or health care professional if they continue or are bothersome):  headache  loss of appetite  trouble sleeping This list may not describe all possible side effects. Call your doctor for medical advice about side effects. You may report side effects to FDA at 1-800-FDA-1088. Where should I keep my medicine? This drug is given in a hospital or clinic and will not be stored at home. NOTE: This sheet is a summary. It may not cover all possible information. If you have questions about this medicine, talk to your doctor, pharmacist, or health care provider.  2020 Elsevier/Gold Standard (2019-03-09 10:56:55) Nivolumab injection What is this medicine? NIVOLUMAB (nye VOL ue mab) is a monoclonal antibody. It is used to treat colon cancer, esophageal cancer, head and neck cancer, Hodgkin lymphoma, kidney cancer, liver cancer, lung cancer, mesothelioma, melanoma, and urothelial cancer. This medicine may be used for other purposes; ask your health care provider or pharmacist if you have questions. COMMON BRAND NAME(S): Opdivo What should I tell my health care provider before I take this medicine? They need to know if you have any of these conditions:  diabetes  immune system problems  kidney disease  liver disease  lung disease  organ transplant  stomach or intestine problems  thyroid disease  an unusual or allergic reaction to nivolumab, other medicines, foods, dyes, or preservatives  pregnant or trying to get pregnant  breast-feeding How should I use this medicine? This medicine is for infusion into a vein. It is given by a health  care professional in a hospital or clinic setting. A special MedGuide will be given to you before each treatment. Be sure to read this information carefully each time. Talk to your pediatrician regarding the use of this medicine in children. While this drug may be prescribed for children as young as 12 years for selected conditions, precautions do apply. Overdosage: If you think you have taken too much of this medicine contact a poison control center or emergency room at once. NOTE: This medicine is only for you. Do not share this medicine with others. What if I miss a dose? It is important not to miss your dose. Call your doctor or health care professional if you are unable to keep an appointment. What may interact with this medicine? Interactions have not been studied. Give your health care provider a list of all the medicines, herbs, non-prescription drugs, or dietary supplements you use. Also tell them if you smoke, drink alcohol, or use illegal drugs. Some items may interact with your medicine. This list may not describe all possible interactions. Give your health care provider a list of all the medicines, herbs, non-prescription drugs, or dietary supplements you use. Also tell them if you smoke, drink alcohol, or use illegal drugs. Some items may interact with your medicine. What should I watch for while using this medicine? This drug may make you feel generally  unwell. Continue your course of treatment even though you feel ill unless your doctor tells you to stop. You may need blood work done while you are taking this medicine. Do not become pregnant while taking this medicine or for 5 months after stopping it. Women should inform their doctor if they wish to become pregnant or think they might be pregnant. There is a potential for serious side effects to an unborn child. Talk to your health care professional or pharmacist for more information. Do not breast-feed an infant while taking this  medicine or for 5 months after stopping it. What side effects may I notice from receiving this medicine? Side effects that you should report to your doctor or health care professional as soon as possible:  allergic reactions like skin rash, itching or hives, swelling of the face, lips, or tongue  breathing problems  blood in the urine  bloody or watery diarrhea or black, tarry stools  changes in emotions or moods  changes in vision  chest pain  cough  dizziness  feeling faint or lightheaded, falls  fever, chills  headache with fever, neck stiffness, confusion, loss of memory, sensitivity to light, hallucination, loss of contact with reality, or seizures  joint pain  mouth sores  redness, blistering, peeling or loosening of the skin, including inside the mouth  severe muscle pain or weakness  signs and symptoms of high blood sugar such as dizziness; dry mouth; dry skin; fruity breath; nausea; stomach pain; increased hunger or thirst; increased urination  signs and symptoms of kidney injury like trouble passing urine or change in the amount of urine  signs and symptoms of liver injury like dark yellow or brown urine; general ill feeling or flu-like symptoms; light-colored stools; loss of appetite; nausea; right upper belly pain; unusually weak or tired; yellowing of the eyes or skin  swelling of the ankles, feet, hands  trouble passing urine or change in the amount of urine  unusually weak or tired  weight gain or loss Side effects that usually do not require medical attention (report to your doctor or health care professional if they continue or are bothersome):  bone pain  constipation  decreased appetite  diarrhea  muscle pain  nausea, vomiting  tiredness This list may not describe all possible side effects. Call your doctor for medical advice about side effects. You may report side effects to FDA at 1-800-FDA-1088. Where should I keep my  medicine? This drug is given in a hospital or clinic and will not be stored at home. NOTE: This sheet is a summary. It may not cover all possible information. If you have questions about this medicine, talk to your doctor, pharmacist, or health care provider.  2020 Elsevier/Gold Standard (2019-03-09 10:04:50)    If you develop nausea and vomiting that is not controlled by your nausea medication, call the clinic.   BELOW ARE SYMPTOMS THAT SHOULD BE REPORTED IMMEDIATELY:  *FEVER GREATER THAN 100.5 F  *CHILLS WITH OR WITHOUT FEVER  NAUSEA AND VOMITING THAT IS NOT CONTROLLED WITH YOUR NAUSEA MEDICATION  *UNUSUAL SHORTNESS OF BREATH  *UNUSUAL BRUISING OR BLEEDING  TENDERNESS IN MOUTH AND THROAT WITH OR WITHOUT PRESENCE OF ULCERS  *URINARY PROBLEMS  *BOWEL PROBLEMS  UNUSUAL RASH Items with * indicate a potential emergency and should be followed up as soon as possible.  Feel free to call the clinic should you have any questions or concerns at The clinic phone number is [phone].  Please show the [facility]  CARD at check-in to the Emergency Department and triage nurse.

## 2020-03-31 NOTE — Progress Notes (Signed)
Pt stable at time of discharge. 

## 2020-04-07 ENCOUNTER — Ambulatory Visit: Payer: 59 | Admitting: Cardiology

## 2020-04-11 ENCOUNTER — Telehealth: Payer: Self-pay

## 2020-04-11 NOTE — Telephone Encounter (Addendum)
Pts wife, [name] called called to report that pt has developed a new pain under left arm. She is very concerned that he needs to be seen before his scheduled appt w/Melissa (which is to look at increasing size of moles on left shoulder & lower back). I sent In basket message to Dr Hinton Rao.  Derwood Kaplan, MD  Dairl Ponder, RN I thought I sent message back yest but can't find any. If you can find opening, bring in sooner    I notified Gay Filler, to look for earlier pt appt. Pt's number [phone]

## 2020-04-11 NOTE — Progress Notes (Deleted)
Cardiology Office Note:    Date:  04/11/2020   ID:  Kathrine Cords., DOB 19-Mar-1961, MRN [id number]  PCP:  Patient, No Pcp Per  Cardiologist:  Shirlee More, MD    Referring MD: No ref. provider found    ASSESSMENT:    No diagnosis found. PLAN:    In order of problems listed above:  1. ***   Next appointment: ***   Medication Adjustments/Labs and Tests Ordered: Current medicines are reviewed at length with the patient today.  Concerns regarding medicines are outlined above.  No orders of the defined types were placed in this encounter.  No orders of the defined types were placed in this encounter.   No chief complaint on file.   History of Present Illness:    Tykeem Lanzer. is a 59 y.o. male with a hx of persistent atrial fibrillation anticoagulated on digoxin with cardiomyopathy EF 35 to 40% melanoma with chemotherapy last seen 03/24/2020 with recurrent decompensated heart failure associated with interruption of guideline directed treatment due to financial restriction.. Compliance with diet, lifestyle and medications: *** Past Medical History:  Diagnosis Date  . Atrial fibrillation ([place]) 02/29/2020  . Depressed left ventricular ejection fraction 02/29/2020  . 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 MOLE REMOVAL    . SALIVARY GLAND SURGERY      Current Medications: No outpatient medications have been marked as taking for the 04/12/20 encounter (Appointment) with Richardo Priest, MD.     Allergies:   Patient has no known allergies.   Social History   Socioeconomic History  . Marital status: Married    Spouse name: Not on file  . Number of children: Not on file  . Years of education: Not on file  . Highest education level: Not on file  Occupational History  . Not on file  Tobacco Use  . Smoking status: Former Research scientist (life sciences)  . Smokeless tobacco: Never Used  Substance and  Sexual Activity  . Alcohol use: Not on file  . Drug use: Not on file  . Sexual activity: Not on file  Other Topics Concern  . Not on file  Social History Narrative  . Not on file   Social Determinants of Health   Financial Resource Strain:   . Difficulty of Paying Living Expenses: Not on file  Food Insecurity:   . Worried About Charity fundraiser in the Last Year: Not on file  . Ran Out of Food in the Last Year: Not on file  Transportation Needs:   . Lack of Transportation (Medical): Not on file  . Lack of Transportation (Non-Medical): Not on file  Physical Activity:   . Days of Exercise per Week: Not on file  . Minutes of Exercise per Session: Not on file  Stress:   . Feeling of Stress : Not on file  Social Connections:   . Frequency of Communication with Friends and Family: Not on file  . Frequency of Social Gatherings with Friends and Family: Not on file  . Attends Religious Services: Not on file  . Active Member of Clubs or Organizations: Not on file  . Attends Archivist Meetings: Not on file  . Marital Status: Not on file     Family History: The patient's ***family history includes Hypertension in his father and mother. ROS:   Please see the history of present illness.    All other  systems reviewed and are negative.  EKGs/Labs/Other Studies Reviewed:    The following studies were reviewed today:  EKG:  EKG ordered today and personally reviewed.  The ekg ordered today demonstrates ***  Recent Labs: 02/29/2020: Magnesium 2.0 03/30/2020: ALT 32; BUN 10; Creatinine 0.9; Hemoglobin 16.3; Platelets 334; Potassium 4.1; Sodium 140; TSH 2.82  Recent Lipid Panel No results found for: CHOL, TRIG, HDL, CHOLHDL, VLDL, LDLCALC, LDLDIRECT  Physical Exam:    VS:  There were no vitals taken for this visit.    Wt Readings from Last 3 Encounters:  03/31/20 166 lb 4 oz (75.4 kg)  03/24/20 166 lb 12.8 oz (75.7 kg)  03/20/20 162 lb 9 oz (73.7 kg)     GEN: ***  Well nourished, well developed in no acute distress HEENT: Normal NECK: No JVD; No carotid bruits LYMPHATICS: No lymphadenopathy CARDIAC: ***RRR, no murmurs, rubs, gallops RESPIRATORY:  Clear to auscultation without rales, wheezing or rhonchi  ABDOMEN: Soft, non-tender, non-distended MUSCULOSKELETAL:  No edema; No deformity  SKIN: Warm and dry NEUROLOGIC:  Alert and oriented x 3 PSYCHIATRIC:  Normal affect    Signed, Shirlee More, MD  04/11/2020 2:45 PM    [facility]

## 2020-04-12 ENCOUNTER — Other Ambulatory Visit: Payer: Self-pay

## 2020-04-12 ENCOUNTER — Ambulatory Visit (INDEPENDENT_AMBULATORY_CARE_PROVIDER_SITE_OTHER): Payer: 59 | Admitting: Cardiology

## 2020-04-12 ENCOUNTER — Encounter: Payer: Self-pay | Admitting: Cardiology

## 2020-04-12 ENCOUNTER — Ambulatory Visit: Payer: 59 | Admitting: Cardiology

## 2020-04-12 VITALS — BP 106/82 | HR 62 | Ht 63.0 in | Wt 165.1 lb

## 2020-04-12 DIAGNOSIS — I5022 Chronic systolic (congestive) heart failure: Secondary | ICD-10-CM

## 2020-04-12 DIAGNOSIS — Z7901 Long term (current) use of anticoagulants: Secondary | ICD-10-CM | POA: Diagnosis not present

## 2020-04-12 DIAGNOSIS — Z79899 Other long term (current) drug therapy: Secondary | ICD-10-CM | POA: Diagnosis not present

## 2020-04-12 DIAGNOSIS — I4819 Other persistent atrial fibrillation: Secondary | ICD-10-CM | POA: Diagnosis not present

## 2020-04-12 NOTE — Telephone Encounter (Addendum)
-----   Message from Derwood Kaplan, MD sent at 04/12/2020  1:37 PM EST ----- Regarding: RE: New pain under left arm. I thought I sent message back yest but can't find any.  If you can find opening, bring in sooner ----- Message ----- From: Dairl Ponder, RN Sent: 04/11/2020   2:10 PM EST To: Derwood Kaplan, MD Subject: New pain under left arm.                       Pt's wife, [name], called & is concerned. Pt has new pain under his left arm. The moles on his shoulder and lower back are larger also. He has appt to see Melissa,NP, on the 16th for the moles to be assessed. However, she is very concerned about the new pain & wonders if he needs to be seen  before? [phone]  Pamala Hurry was able to get appt for pt tomorrow @ 3p for labs, 330p with Kelli,PA. I called pt's wife & confirmed appt.

## 2020-04-12 NOTE — Progress Notes (Signed)
Cardiology Office Note:    Date:  04/12/2020   ID:  Adam Cords., DOB 1960/12/19, MRN [id number]  PCP:  Patient, No Pcp Per  Cardiologist:  Shirlee More, MD    Referring MD: No ref. provider found    ASSESSMENT:    1. Persistent atrial fibrillation ([place])   2. Chronic anticoagulation   3. Chronic systolic heart failure ([place])   4. High risk medication use    PLAN:    In order of problems listed above:  1. Stable rate is controlled continue current treatment including beta-blocker and reduced dose anticoagulant with CNS mets and digoxin 2. Continue his current anticoagulant directed by oncology 3. Marked improvement New York Heart Association class I continue Entresto beta-blocker diuretic recheck renal function proBNP 4. Continue digoxin goal level less than 1 check Tuesday   Next appointment: 6 weeks we will try to uptitrate Entresto at that time   Medication Adjustments/Labs and Tests Ordered: Current medicines are reviewed at length with the patient today.  Concerns regarding medicines are outlined above.  No orders of the defined types were placed in this encounter.  No orders of the defined types were placed in this encounter.   Chief Complaint  Patient presents with  . Follow-up  . Atrial Fibrillation  . Anticoagulation  . Congestive Heart Failure    History of Present Illness:    Adam Gellatly. is a 59 y.o. male with a hx of persistent atrial fibrillation anticoagulated on digoxin with cardiomyopathy EF 35 to 40% melanoma with chemotherapy last seen 03/24/2020 with recurrent decompensated heart failure associated with interruption of guideline directed treatment due to financial restriction.. Compliance with diet, lifestyle and medications: Yes  He is doing much better continues in his cancer treatment for labs Tuesday at the cancer center we will add on BMP proBNP and a digoxin level no nausea or vomiting from his digoxin no awareness of  atrial fibrillation at this time after restarting Entresto and a diuretic no edema shortness of breath or chest pain.  We discussed the potential of cardioversion I am hesitant as he takes a reduced dose of anticoagulant.  Not only would he require transesophageal echo prior to cardioversion but he would remain at increased risk of stroke afterwards unless he took full dose anticoagulant and I remember discussion with oncology and hesitancy with the CNS tumor.  I think at this time her best to continue rate control.  Past Medical History:  Diagnosis Date  . Atrial fibrillation ([place]) 02/29/2020  . Depressed left ventricular ejection fraction 02/29/2020  . 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 MOLE REMOVAL    . SALIVARY GLAND SURGERY      Current Medications: Current Meds  Medication Sig  . benzonatate (TESSALON) 100 MG capsule Take 100 mg by mouth 3 (three) times daily as needed.  . digoxin (LANOXIN) 0.125 MG tablet Take 1 tablet (125 mcg total) by mouth daily.  Marland Kitchen diltiazem (CARDIZEM) 30 MG tablet Take by mouth 3 (three) times daily.  Marland Kitchen ELIQUIS 2.5 MG TABS tablet Take 2.5 mg by mouth 2 (two) times daily.  . empagliflozin (JARDIANCE) 10 MG TABS tablet Take 1 tablet (10 mg total) by mouth daily before breakfast.  . furosemide (LASIX) 20 MG tablet Take 1 tablet (20 mg total) by mouth daily.  Marland Kitchen HYDROcodone-acetaminophen (NORCO/VICODIN) 5-325 MG tablet Take 1 tablet by mouth every 4 (four) hours  as needed. for pain  . metoprolol tartrate (LOPRESSOR) 50 MG tablet Take 50 mg by mouth 2 (two) times daily.  Marland Kitchen oxyCODONE (OXY IR/ROXICODONE) 5 MG immediate release tablet Take 5 mg by mouth every 8 (eight) hours as needed.  . sacubitril-valsartan (ENTRESTO) 24-26 MG Take 1 tablet by mouth 2 (two) times daily.     Allergies:   Patient has no known allergies.   Social History   Socioeconomic History  . Marital status:  Married    Spouse name: Not on file  . Number of children: Not on file  . Years of education: Not on file  . Highest education level: Not on file  Occupational History  . Not on file  Tobacco Use  . Smoking status: Former Research scientist (life sciences)  . Smokeless tobacco: Never Used  Substance and Sexual Activity  . Alcohol use: Not on file  . Drug use: Not on file  . Sexual activity: Not on file  Other Topics Concern  . Not on file  Social History Narrative  . Not on file   Social Determinants of Health   Financial Resource Strain:   . Difficulty of Paying Living Expenses: Not on file  Food Insecurity:   . Worried About Charity fundraiser in the Last Year: Not on file  . Ran Out of Food in the Last Year: Not on file  Transportation Needs:   . Lack of Transportation (Medical): Not on file  . Lack of Transportation (Non-Medical): Not on file  Physical Activity:   . Days of Exercise per Week: Not on file  . Minutes of Exercise per Session: Not on file  Stress:   . Feeling of Stress : Not on file  Social Connections:   . Frequency of Communication with Friends and Family: Not on file  . Frequency of Social Gatherings with Friends and Family: Not on file  . Attends Religious Services: Not on file  . Active Member of Clubs or Organizations: Not on file  . Attends Archivist Meetings: Not on file  . Marital Status: Not on file     Family History: The patient's family history includes Hypertension in his father and mother. ROS:   Please see the history of present illness.    All other systems reviewed and are negative.  EKGs/Labs/Other Studies Reviewed:    The following studies were reviewed today:    Recent Labs:   Ref Range & Units 1 mo ago  Digoxin, Serum 0.5 - 0.9 ng/mL 0.5     02/29/2020: Magnesium 2.0 03/30/2020: ALT 32; BUN 10; Creatinine 0.9; Hemoglobin 16.3; Platelets 334; Potassium 4.1; Sodium 140; TSH 2.82  Recent Lipid Panel No results found for: CHOL, TRIG,  HDL, CHOLHDL, VLDL, LDLCALC, LDLDIRECT  Physical Exam:    VS:  BP 106/82   Pulse 62   Ht 5\' 3"  (1.6 m)   Wt 165 lb 1.3 oz (74.9 kg)   SpO2 96%   BMI 29.24 kg/m     Wt Readings from Last 3 Encounters:  04/12/20 165 lb 1.3 oz (74.9 kg)  03/31/20 166 lb 4 oz (75.4 kg)  03/24/20 166 lb 12.8 oz (75.7 kg)     GEN:  Well nourished, well developed in no acute distress HEENT: Normal NECK: No JVD; No carotid bruits LYMPHATICS: No lymphadenopathy CARDIAC: Irregular S1 variable no murmurs, rubs, gallops RESPIRATORY:  Clear to auscultation without rales, wheezing or rhonchi  ABDOMEN: Soft, non-tender, non-distended MUSCULOSKELETAL:  No edema; No deformity  SKIN:  Warm and dry NEUROLOGIC:  Alert and oriented x 3 PSYCHIATRIC:  Normal affect    Signed, Shirlee More, MD  04/12/2020 9:57 AM    [facility]

## 2020-04-12 NOTE — Patient Instructions (Signed)
Medication Instructions:  Your physician recommends that you continue on your current medications as directed. Please refer to the Current Medication list given to you today.  *If you need a refill on your cardiac medications before your next appointment, please call your pharmacy*   Lab Work: Your physician recommends that you return for lab work at [facility], ProBNP, Digoxin If you have labs (blood work) drawn today and your tests are completely normal, you will receive your results only by: Marland Kitchen MyChart Message (if you have MyChart) OR . A paper copy in the mail If you have any lab test that is abnormal or we need to change your treatment, we will call you to review the results.   Testing/Procedures: None   Follow-Up: At [facility], you and your health needs are our priority.  As part of our continuing mission to provide you with exceptional heart care, we have created designated Provider Care Teams.  These Care Teams include your primary Cardiologist (physician) and Advanced Practice Providers (APPs -  Physician Assistants and Nurse Practitioners) who all work together to provide you with the care you need, when you need it.  We recommend signing up for the patient portal called "MyChart".  Sign up information is provided on this After Visit Summary.  MyChart is used to connect with patients for Virtual Visits (Telemedicine).  Patients are able to view lab/test results, encounter notes, upcoming appointments, etc.  Non-urgent messages can be sent to your provider as well.   To learn more about what you can do with MyChart, go to [email or web address].    Your next appointment:   6 week(s)  The format for your next appointment:   In Person  Provider:   Shirlee More, MD   Other Instructions

## 2020-04-13 ENCOUNTER — Ambulatory Visit: Payer: 59 | Admitting: Cardiology

## 2020-04-14 ENCOUNTER — Inpatient Hospital Stay (INDEPENDENT_AMBULATORY_CARE_PROVIDER_SITE_OTHER): Payer: 59 | Admitting: Hematology and Oncology

## 2020-04-14 ENCOUNTER — Inpatient Hospital Stay: Payer: 59 | Attending: Oncology

## 2020-04-14 ENCOUNTER — Encounter: Payer: Self-pay | Admitting: Hematology and Oncology

## 2020-04-14 ENCOUNTER — Other Ambulatory Visit: Payer: Self-pay

## 2020-04-14 VITALS — BP 133/88 | HR 72 | Temp 98.8°F | Resp 18 | Ht 63.0 in | Wt 164.9 lb

## 2020-04-14 DIAGNOSIS — C4359 Malignant melanoma of other part of trunk: Secondary | ICD-10-CM | POA: Diagnosis not present

## 2020-04-14 DIAGNOSIS — C7931 Secondary malignant neoplasm of brain: Secondary | ICD-10-CM | POA: Diagnosis not present

## 2020-04-14 DIAGNOSIS — Z79899 Other long term (current) drug therapy: Secondary | ICD-10-CM | POA: Insufficient documentation

## 2020-04-14 DIAGNOSIS — I4821 Permanent atrial fibrillation: Secondary | ICD-10-CM | POA: Insufficient documentation

## 2020-04-14 DIAGNOSIS — I429 Cardiomyopathy, unspecified: Secondary | ICD-10-CM | POA: Diagnosis not present

## 2020-04-14 DIAGNOSIS — Z5112 Encounter for antineoplastic immunotherapy: Secondary | ICD-10-CM | POA: Insufficient documentation

## 2020-04-14 DIAGNOSIS — E876 Hypokalemia: Secondary | ICD-10-CM | POA: Diagnosis not present

## 2020-04-14 LAB — CMP (CANCER CENTER ONLY)
ALT: 21 U/L (ref 0–44)
AST: 23 U/L (ref 15–41)
Albumin: 4.1 g/dL (ref 3.5–5.0)
Alkaline Phosphatase: 110 U/L (ref 38–126)
Anion gap: 12 (ref 5–15)
BUN: 9 mg/dL (ref 6–20)
CO2: 22 mmol/L (ref 22–32)
Calcium: 9.3 mg/dL (ref 8.9–10.3)
Chloride: 104 mmol/L (ref 98–111)
Creatinine: 1.07 mg/dL (ref 0.61–1.24)
GFR, Estimated: >60 mL/min (ref >60)
Glucose, Bld: 117 mg/dL — ABNORMAL HIGH (ref 70–99)
Potassium: 2.9 mmol/L — ABNORMAL LOW (ref 3.5–5.1)
Sodium: 138 mmol/L (ref 135–145)
Total Bilirubin: 0.7 mg/dL (ref 0.3–1.2)
Total Protein: 7.6 g/dL (ref 6.5–8.1)

## 2020-04-14 LAB — CBC WITH DIFFERENTIAL (CANCER CENTER ONLY)
Abs Immature Granulocytes: 0.03 10*3/uL (ref 0.00–0.07)
Basophils Absolute: 0 10*3/uL (ref 0.0–0.1)
Basophils Relative: 0 %
Eosinophils Absolute: 0.3 10*3/uL (ref 0.0–0.5)
Eosinophils Relative: 3 %
HCT: 46.4 % (ref 39.0–52.0)
Hemoglobin: 15.2 g/dL (ref 13.0–17.0)
Immature Granulocytes: 0 %
Lymphocytes Relative: 48 %
Lymphs Abs: 4.8 10*3/uL — ABNORMAL HIGH (ref 0.7–4.0)
MCH: 29.9 pg (ref 26.0–34.0)
MCHC: 32.8 g/dL (ref 30.0–36.0)
MCV: 91.2 fL (ref 80.0–100.0)
Monocytes Absolute: 0.9 10*3/uL (ref 0.1–1.0)
Monocytes Relative: 9 %
Neutro Abs: 4 10*3/uL (ref 1.7–7.7)
Neutrophils Relative %: 40 %
Platelet Count: 363 10*3/uL (ref 150–400)
RBC: 5.09 MIL/uL (ref 4.22–5.81)
RDW: 14.6 % (ref 11.5–15.5)
WBC Count: 10 10*3/uL (ref 4.0–10.5)
nRBC: 0 % (ref 0.0–0.2)

## 2020-04-14 LAB — TSH: TSH: 1.332 u[IU]/mL (ref 0.350–4.500)

## 2020-04-14 NOTE — Progress Notes (Signed)
Adam Bender  [address] [place],  [place]  [address] [phone]  Clinic Day:  04/17/2020  Referring physician: Derwood Kaplan, MD   CHIEF COMPLAINT:  CC: Left axillary pain and increasing melanoma skin lesions.  Current Treatment:  Palliative immunotherapy with ipilimumab/nivolumab.   HISTORY OF PRESENT ILLNESS:  Adam Bender. is a 58 y.o. 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  He was referred to [place] by the emergency department in August.  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 Michele Bender.  Over the past several months he has had several areas of suspicious skin lesions develop, which seem to have worsened over the past 3 months.  He has not been following with dermatology routinely.  He presented to the [facility] Emergency Department on August 4th due to a 2 month history of headaches.  These occur 3-4 times per week, usually of the right temporal area, but occasionally the left temporal as well.  The headaches are associated with vertigo upon standing or when looking down, nausea and fatigue.  He has been treating with Tylenol 4-6 daily.  He denies any visual acuity changes, numbness or tingling.  Additionally for the 2 days prior to that visit, the patient had a fever, shaking rigors and watery diarrhea.  The patient felt these symptoms could have been due to a tick disease, but this was not evaluated.  CT head imaging revealed subcortical white matter edema involving the left temporal lobe concerning for infarction or underlying neoplasm.  Correlating MRI head confirmed homogeneous enhancement at the periphery of the anterior left temporal lobe measuring 1.9 x 1.5 x 2.4 cm.  There is also adjacent parenchymal edema.  Additional periphery of the inferior right temporal lobe measures 1.0 x 1.5 x 0.5 cm also with adjacent parenchymal edema.  A 4 mm  focus of enhancement is present at the periphery of the right temporal lobe.  Given his history, this is concerning for metastatic melanoma.  He has numerous black skin lesions scattered over his body which are enlarging.    CT chest, abdomen and pelvis from August 11th revealed enlarged left axillary and subpectoral lymph nodes measuring up to 2.0 x 1.9 cm.  Multiple tiny bilateral pulmonary nodules, nonspecific, were also observed.  There was no evidence of metastatic disease in the abdomen or pelvis.  He was seen by Adam Bender and underwent biopsy and excision of his back and lip lesions on August 10th.  The pathology from these procedures revealed  His melanoma is positive for BRAF.   He received his first dose of immunotherapy with ipilimumab/nivolumab IV every 3 weeks on September 17th and tolerated this well.  However, he presented to the emergency department later that day due to persistent atrial fibrillation and high fever.  He was placed on medications and started on low-dose anticoagulation.  He was admitted with right lower lobe pneumonia and treated accordingly with cefepime and doxycycline.  He was discharged on September 23rd on Cardizem 30 mg TID, Entresto 24/26 BID, digoxin 0.125 mg daily, metoprolol 50 mg BID, Eliquis 2.5 mg BID, doxycycline 100 mg BID #10, and Farxiga 10 mg daily.    He received a 3rd cycle of ipilimumab/nivolumab on October 29th, at which time he was doing well.   INTERVAL HISTORY:  Adam Bender is seen today as his wife telephoned reporting left axillary pain and increasing skin lesions.  The patient states the pain in his axilla has resolved.  He did not note any increase in the adenopathy or any irritation in the area.  He is concerned that several of the skin lesions appear bigger and firmer, especially the 1 of the left shoulder and right distal lower external. He denies fever, chills, night sweats, or other signs of infection. He denies cardiorespiratory and  gastrointestinal issues. He  denies pain. His appetite is good. His weight has been stable.  REVIEW OF SYSTEMS:  Review of Systems  Constitutional: Negative for appetite change, chills, fatigue and fever.  HENT:   Negative for mouth sores and sore throat.   Eyes: Negative for icterus.  Respiratory: Positive for cough (occasional, non-productive) and shortness of breath (occasional, mild with exertion). Negative for chest tightness, hemoptysis and wheezing.   Cardiovascular: Negative for chest pain, leg swelling and palpitations.  Gastrointestinal: Negative for abdominal pain, blood in stool, constipation, diarrhea, nausea and vomiting.  Endocrine: Negative for hot flashes.  Genitourinary: Negative for difficulty urinating, dysuria, frequency and hematuria.   Musculoskeletal: Negative for arthralgias and myalgias.  Skin: Negative for itching, rash and wound.  Neurological: Negative for dizziness, extremity weakness, headaches, light-headedness and numbness.  Hematological: Negative for adenopathy. Does not bruise/bleed easily.  Psychiatric/Behavioral: Negative for depression. The patient is not nervous/anxious.      VITALS:  Blood pressure 133/88, pulse 72, temperature 98.8 F (37.1 C), temperature source Oral, resp. rate 18, height '5\' 3"'  (1.6 m), weight 164 lb 14.4 oz (74.8 kg), SpO2 96 %.  Wt Readings from Last 3 Encounters:  04/14/20 164 lb 14.4 oz (74.8 kg)  04/12/20 165 lb 1.3 oz (74.9 kg)  03/31/20 166 lb 4 oz (75.4 kg)    Body mass index is 29.21 kg/m.  Performance status (ECOG): 1 - Symptomatic but completely ambulatory  PHYSICAL EXAM:  Physical Exam Constitutional:      General: He is not in acute distress.    Appearance: Normal appearance.  HENT:     Mouth/Throat:     Mouth: Mucous membranes are moist.     Pharynx: Oropharynx is clear.  Eyes:     Conjunctiva/sclera: Conjunctivae normal.  Cardiovascular:     Rate and Rhythm: Normal rate. Rhythm irregularly  irregular.     Heart sounds: No murmur heard.  No gallop.   Pulmonary:     Effort: Pulmonary effort is normal.     Breath sounds: No wheezing, rhonchi or rales.  Abdominal:     General: There is no distension.     Palpations: Abdomen is soft. There is no hepatomegaly, splenomegaly or mass.     Tenderness: There is no abdominal tenderness.  Musculoskeletal:     Cervical back: Neck supple.  Skin:    General: Skin is warm.     Comments: Multiple melanoma lesions scattered over body, largest of left shoulder about 1.5cm, raised and more firm, another of distal right lower extremity about 1cm raised and more firm.  Neurological:     Mental Status: He is alert.   Lymph nodes:   There is persistent left axillary adenopathy measuring about 2cm.  There is no cervical, clavicular, right axillary or inguinal lymphadenopathy.   LABS:   CBC Latest Ref Rng & Units 04/14/2020 03/30/2020 03/09/2020  WBC 4.0 - 10.5 K/uL 10.0 12.6 13.6  Hemoglobin 13.0 - 17.0 g/dL 15.2 16.3 14.9  Hematocrit 39 - 52 % 46.4 50 45  Platelets 150 - 400 K/uL 363 334 349  CMP Latest Ref Rng & Units 04/14/2020 03/30/2020 03/09/2020  Glucose 70 - 99 mg/dL 117(H) - -  BUN 6 - 20 mg/dL '9 10 12  ' Creatinine 0.61 - 1.24 mg/dL 1.07 0.9 1.0  Sodium 135 - 145 mmol/L 138 140 141  Potassium 3.5 - 5.1 mmol/L 2.9(L) 4.1 3.9  Chloride 98 - 111 mmol/L 104 110(A) 107  CO2 22 - 32 mmol/L '22 19 22  ' Calcium 8.9 - 10.3 mg/dL 9.3 9.1 9.4  Total Protein 6.5 - 8.1 g/dL 7.6 - -  Total Bilirubin 0.3 - 1.2 mg/dL 0.7 - -  Alkaline Phos 38 - 126 U/L 110 129(A) 123  AST 15 - 41 U/L 23 34 31  ALT 0 - 44 U/L 21 32 38     No results found for: CEA1 / No results found for: CEA1 No results found for: PSA1 No results found for: EEF007 No results found for: HQR975  No results found for: TOTALPROTELP, ALBUMINELP, A1GS, A2GS, BETS, BETA2SER, GAMS, MSPIKE, SPEI No results found for: TIBC, FERRITIN, IRONPCTSAT No results found for:  LDH  STUDIES:  No results found.    HISTORY:   Past Medical History:  Diagnosis Date  . Atrial fibrillation ([place]) 02/29/2020  . Depressed left ventricular ejection fraction 02/29/2020  . Malignant melanoma of other part of trunk ([place]) 02/20/2020    Past Surgical History:  Procedure Laterality Date  . MOLE REMOVAL    . SALIVARY GLAND SURGERY      Family History  Problem Relation Age of Onset  . Hypertension Mother   . Hypertension Father     Social History:  reports that he has quit smoking. He has never used smokeless tobacco. No history on file for alcohol use and drug use.The patient is accompanied by his wife today.  Allergies: No Known Allergies  Current Medications: Current Outpatient Medications  Medication Sig Dispense Refill  . benzonatate (TESSALON) 100 MG capsule Take 100 mg by mouth 3 (three) times daily as needed.    . digoxin (LANOXIN) 0.125 MG tablet Take 1 tablet (125 mcg total) by mouth daily. 30 tablet 5  . diltiazem (CARDIZEM) 30 MG tablet Take by mouth 3 (three) times daily.    Marland Kitchen ELIQUIS 2.5 MG TABS tablet Take 2.5 mg by mouth 2 (two) times daily.    . empagliflozin (JARDIANCE) 10 MG TABS tablet Take 1 tablet (10 mg total) by mouth daily before breakfast. 30 tablet 6  . furosemide (LASIX) 20 MG tablet Take 1 tablet (20 mg total) by mouth daily. 90 tablet 3  . HYDROcodone-acetaminophen (NORCO/VICODIN) 5-325 MG tablet Take 1 tablet by mouth every 4 (four) hours as needed. for pain    . metoprolol tartrate (LOPRESSOR) 50 MG tablet Take 50 mg by mouth 2 (two) times daily.    Marland Kitchen oxyCODONE (OXY IR/ROXICODONE) 5 MG immediate release tablet Take 5 mg by mouth every 8 (eight) hours as needed.    . sacubitril-valsartan (ENTRESTO) 24-26 MG Take 1 tablet by mouth 2 (two) times daily. 60 tablet 3   No current facility-administered medications for this visit.     ASSESSMENT & PLAN:   Assessment/Plan: 1. History of melanoma of the right upper abdominal wall about  10 years ago.  This was treated with resection/wide excision with Dr. Michele Bender.  This area remains without evidence of recurrence.  We will attempt to request the pathology on that. 2.  New enhancement of the bilateral temporal lobes as seen on recent MRI imaging.  The left  measures 1.9 x 1.5 x 2.4 cm, and the right measures 1.0 x 1.5 x 0.5 cm.  There is also associated parenchymal edema and possible abnormal leptomeningeal enhancement.  We feel this is metastatic melanoma.   3.  Enlarged left axillary and subpectoral metastatic lymph nodes measuring up to 2.0 x 1.9 cm. 4.  Innumerable dark and pedunculated moles all over the body, which have worsened over the past few months, and clearly appear consistent with metastatic melanoma.  The largest measures about 3 cm of the middle back.  He has undergone biopsy and excision of two of these.  He   Continues immunotherapy with ipilimumab/nivolumab and is tolerating it well.  There has been a slight increase in the lesion of his left shoulder and right lower extremity, which I feel is treatment effect.  We will continue to monitor this. 5.  Intermittent severe headaches, much improved since being treated for pneumonia.  He uses Tylenol or ibuprofen when needed.   6.  Multiple tiny bilateral pulmonary nodules, nonspecific.  These are likely benign, but could represent early metastatic disease.  We will continue to monitor.   7.  Positive BRAF mutation. 8. New onset atrial fibrillation, which occurred even prior to starting his therapy.  We really do not know how long this has been going on.  He is on medications and low dose anticoagulation.  There is certainly risk involved whether we do or do not anticoagulate, but we do feel comfortable with a low dose and have discussed this with Dr. Bettina Gavia.  We will monitor his neurologic status closely. His rate is controlled today. 9. Cardiomyopathy.  He has been started on Entresto.   10. Right lower lobe pneumonia,  resolved. 11.Hypokalemia,  Due to his furosemide.  He has potassium supplements at home, but does not take them regularly.  I instructed him to take this daily.  As he is otherwise doing well, I will have him proceed with a 4th cycle of ipilimumab/nivolumab on November 19th.  We will then see him 3 weeks later to start maintenance nivolumab.  The patient and his wife understand the plans discussed today and are in agreement with them.  They know to contact our office if his develops concerns prior to his next appointment.   Marvia Pickles, PA-C

## 2020-04-15 ENCOUNTER — Telehealth: Payer: Self-pay | Admitting: Oncology

## 2020-04-15 LAB — T4: T4, Total: 6.6 ug/dL (ref 4.5–12.0)

## 2020-04-15 NOTE — Telephone Encounter (Signed)
No 11/12 LOS Appt's Requests Entered

## 2020-04-17 ENCOUNTER — Other Ambulatory Visit: Payer: Self-pay | Admitting: Oncology

## 2020-04-17 ENCOUNTER — Ambulatory Visit: Payer: 59 | Admitting: Oncology

## 2020-04-17 ENCOUNTER — Telehealth: Payer: Self-pay | Admitting: Oncology

## 2020-04-17 ENCOUNTER — Other Ambulatory Visit: Payer: 59

## 2020-04-17 NOTE — Progress Notes (Deleted)
[employer] [facility]  [address] [place],  [place]  [address] [phone]  Clinic Day:  04/17/2020  Referring physician: Derwood Kaplan, MD   CHIEF COMPLAINT:  CC: Left axillary pain and increasing melanoma skin lesions.  Current Treatment:  Palliative immunotherapy with ipilimumab/nivolumab.   HISTORY OF PRESENT ILLNESS:  Adam Bender. is a 59 y.o. 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  He was referred to [place] by the emergency department in August.  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 Michele Mcalpine.  Over the past several months he has had several areas of suspicious skin lesions develop, which seem to have worsened over the past 3 months.  He has not been following with dermatology routinely.  He presented to the [facility] Emergency Department on August 4th due to a 2 month history of headaches.  These occur 3-4 times per week, usually of the right temporal area, but occasionally the left temporal as well.  The headaches are associated with vertigo upon standing or when looking down, nausea and fatigue.  He has been treating with Tylenol 4-6 daily.  He denies any visual acuity changes, numbness or tingling.  Additionally for the 2 days prior to that visit, the patient had a fever, shaking rigors and watery diarrhea.  The patient felt these symptoms could have been due to a tick disease, but this was not evaluated.  CT head imaging revealed subcortical white matter edema involving the left temporal lobe concerning for infarction or underlying neoplasm.  Correlating MRI head confirmed homogeneous enhancement at the periphery of the anterior left temporal lobe measuring 1.9 x 1.5 x 2.4 cm.  There is also adjacent parenchymal edema.  Additional periphery of the inferior right temporal lobe measures 1.0 x 1.5 x 0.5 cm also with adjacent parenchymal edema.  A 4 mm  focus of enhancement is present at the periphery of the right temporal lobe.  Given his history, this is concerning for metastatic melanoma.  He has numerous black skin lesions scattered over his body which are enlarging.    CT chest, abdomen and pelvis from August 11th revealed enlarged left axillary and subpectoral lymph nodes measuring up to 2.0 x 1.9 cm.  Multiple tiny bilateral pulmonary nodules, nonspecific, were also observed.  There was no evidence of metastatic disease in the abdomen or pelvis.  He was seen by Dr. Lilia Pro and underwent biopsy and excision of his back and lip lesions on August 10th.  The pathology from these procedures revealed  His melanoma is positive for BRAF.   He received his first dose of immunotherapy with ipilimumab/nivolumab IV every 3 weeks on September 17th and tolerated this well.  However, he presented to the emergency department later that day due to persistent atrial fibrillation and high fever.  He was placed on medications and started on low-dose anticoagulation.  He was admitted with right lower lobe pneumonia and treated accordingly with cefepime and doxycycline.  He was discharged on September 23rd on Cardizem 30 mg TID, Entresto 24/26 BID, digoxin 0.125 mg daily, metoprolol 50 mg BID, Eliquis 2.5 mg BID, doxycycline 100 mg BID #10, and Farxiga 10 mg daily.    He received a 3rd cycle of ipilimumab/nivolumab on October 29th, at which time he was doing well.   INTERVAL HISTORY:  [name] is seen today as his wife telephoned reporting left axillary pain and increasing skin lesions.  The patient states the pain in his axilla has resolved.  He did not note any increase in the adenopathy or any irritation in the area.  He is concerned that several of the skin lesions appear bigger and firmer, especially the 1 of the left shoulder and right distal lower external. He denies fever, chills, night sweats, or other signs of infection. He denies cardiorespiratory and  gastrointestinal issues. He  denies pain. His appetite is good. His weight has been stable.  REVIEW OF SYSTEMS:  Review of Systems  Constitutional: Negative for appetite change, chills, fatigue and fever.  HENT:   Negative for mouth sores and sore throat.   Eyes: Negative for icterus.  Respiratory: Positive for cough (occasional, non-productive) and shortness of breath (occasional, mild with exertion). Negative for chest tightness, hemoptysis and wheezing.   Cardiovascular: Negative for chest pain, leg swelling and palpitations.  Gastrointestinal: Negative for abdominal pain, blood in stool, constipation, diarrhea, nausea and vomiting.  Endocrine: Negative for hot flashes.  Genitourinary: Negative for difficulty urinating, dysuria, frequency and hematuria.   Musculoskeletal: Negative for arthralgias and myalgias.  Skin: Negative for itching, rash and wound.  Neurological: Negative for dizziness, extremity weakness, headaches, light-headedness and numbness.  Hematological: Negative for adenopathy. Does not bruise/bleed easily.  Psychiatric/Behavioral: Negative for depression. The patient is not nervous/anxious.      VITALS:  There were no vitals taken for this visit.  Wt Readings from Last 3 Encounters:  04/14/20 164 lb 14.4 oz (74.8 kg)  04/12/20 165 lb 1.3 oz (74.9 kg)  03/31/20 166 lb 4 oz (75.4 kg)    There is no height or weight on file to calculate BMI.  Performance status (ECOG): 1 - Symptomatic but completely ambulatory  PHYSICAL EXAM:  Physical Exam Constitutional:      General: He is not in acute distress.    Appearance: Normal appearance.  HENT:     Mouth/Throat:     Mouth: Mucous membranes are moist.     Pharynx: Oropharynx is clear.  Eyes:     Conjunctiva/sclera: Conjunctivae normal.  Cardiovascular:     Rate and Rhythm: Normal rate. Rhythm irregularly irregular.     Heart sounds: No murmur heard.  No gallop.   Pulmonary:     Effort: Pulmonary effort is  normal.     Breath sounds: No wheezing, rhonchi or rales.  Abdominal:     General: There is no distension.     Palpations: Abdomen is soft. There is no hepatomegaly, splenomegaly or mass.     Tenderness: There is no abdominal tenderness.  Musculoskeletal:     Cervical back: Neck supple.  Skin:    General: Skin is warm.     Comments: Multiple melanoma lesions scattered over body, largest of left shoulder about 1.5cm, raised and more firm, another of distal right lower extremity about 1cm raised and more firm.  Neurological:     Mental Status: He is alert.   Lymph nodes:   There is persistent left axillary adenopathy measuring about 2cm.  There is no cervical, clavicular, right axillary or inguinal lymphadenopathy.   LABS:   CBC Latest Ref Rng & Units 04/14/2020 03/30/2020 03/09/2020  WBC 4.0 - 10.5 K/uL 10.0 12.6 13.6  Hemoglobin 13.0 - 17.0 g/dL 15.2 16.3 14.9  Hematocrit 39 - 52 % 46.4 50 45  Platelets 150 - 400 K/uL 363 334 349   CMP Latest Ref Rng & Units 04/14/2020 03/30/2020 03/09/2020  Glucose 70 - 99 mg/dL 117(H) - -  BUN 6 - 20 mg/dL '9 10 12  ' Creatinine 0.61 - 1.24 mg/dL 1.07 0.9 1.0  Sodium 135 - 145 mmol/L 138 140 141  Potassium 3.5 - 5.1 mmol/L 2.9(L) 4.1 3.9  Chloride 98 - 111 mmol/L 104 110(A) 107  CO2 22 - 32 mmol/L '22 19 22  ' Calcium 8.9 - 10.3 mg/dL 9.3 9.1 9.4  Total Protein 6.5 - 8.1 g/dL 7.6 - -  Total Bilirubin 0.3 - 1.2 mg/dL 0.7 - -  Alkaline Phos 38 - 126 U/L 110 129(A) 123  AST 15 - 41 U/L 23 34 31  ALT 0 - 44 U/L 21 32 38     No results found for: CEA1 / No results found for: CEA1 No results found for: PSA1 No results found for: MAU633 No results found for: HLK562  No results found for: TOTALPROTELP, ALBUMINELP, A1GS, A2GS, BETS, BETA2SER, GAMS, MSPIKE, SPEI No results found for: TIBC, FERRITIN, IRONPCTSAT No results found for: LDH  STUDIES:  No results found.    HISTORY:   Past Medical History:  Diagnosis Date  . Atrial fibrillation  ([place]) 02/29/2020  . Depressed left ventricular ejection fraction 02/29/2020  . Malignant melanoma of other part of trunk ([place]) 02/20/2020    Past Surgical History:  Procedure Laterality Date  . MOLE REMOVAL    . SALIVARY GLAND SURGERY      Family History  Problem Relation Age of Onset  . Hypertension Mother   . Hypertension Father     Social History:  reports that he has quit smoking. He has never used smokeless tobacco. No history on file for alcohol use and drug use.The patient is accompanied by his wife today.  Allergies: No Known Allergies  Current Medications: Current Outpatient Medications  Medication Sig Dispense Refill  . benzonatate (TESSALON) 100 MG capsule Take 100 mg by mouth 3 (three) times daily as needed.    . digoxin (LANOXIN) 0.125 MG tablet Take 1 tablet (125 mcg total) by mouth daily. 30 tablet 5  . diltiazem (CARDIZEM) 30 MG tablet Take by mouth 3 (three) times daily.    Marland Kitchen ELIQUIS 2.5 MG TABS tablet Take 2.5 mg by mouth 2 (two) times daily.    . empagliflozin (JARDIANCE) 10 MG TABS tablet Take 1 tablet (10 mg total) by mouth daily before breakfast. 30 tablet 6  . furosemide (LASIX) 20 MG tablet Take 1 tablet (20 mg total) by mouth daily. 90 tablet 3  . HYDROcodone-acetaminophen (NORCO/VICODIN) 5-325 MG tablet Take 1 tablet by mouth every 4 (four) hours as needed. for pain    . metoprolol tartrate (LOPRESSOR) 50 MG tablet Take 50 mg by mouth 2 (two) times daily.    Marland Kitchen oxyCODONE (OXY IR/ROXICODONE) 5 MG immediate release tablet Take 5 mg by mouth every 8 (eight) hours as needed.    . sacubitril-valsartan (ENTRESTO) 24-26 MG Take 1 tablet by mouth 2 (two) times daily. 60 tablet 3   No current facility-administered medications for this visit.     ASSESSMENT & PLAN:   Assessment/Plan: 1. History of melanoma of the right upper abdominal wall about 10 years ago.  This was treated with resection/wide excision with Dr. Michele Mcalpine.  This area remains without evidence of  recurrence.  We will attempt to request the pathology on that. 2.  New enhancement of the bilateral temporal lobes as seen on recent MRI imaging.  The left measures 1.9 x 1.5 x 2.4 cm, and the right measures 1.0 x 1.5 x 0.5 cm.  There  is also associated parenchymal edema and possible abnormal leptomeningeal enhancement.  We feel this is metastatic melanoma.   3.  Enlarged left axillary and subpectoral metastatic lymph nodes measuring up to 2.0 x 1.9 cm. 4.  Innumerable dark and pedunculated moles all over the body, which have worsened over the past few months, and clearly appear consistent with metastatic melanoma.  The largest measures about 3 cm of the middle back.  He has undergone biopsy and excision of two of these.  He started immunotherapy with Ipilimumab/nivolumab on September 17th and tolerated this without difficulty. 5.  Intermittent severe headaches, much improved since being treated for pneumonia.  He uses Tylenol or ibuprofen when needed.   6.  Multiple tiny bilateral pulmonary nodules, nonspecific.  These are likely benign, but could represent early metastatic disease.  We will continue to monitor.   7.  Positive BRAF mutation. 8. New onset atrial fibrillation, which occurred even prior to starting his therapy.  We really do not know how long this has been going on.  He is on medications and low dose anticoagulation.  There is certainly risk involved whether we do or do not anticoagulate, but we do feel comfortable with a low dose and have discussed this with Dr. Bettina Gavia.  We will monitor his neurologic status closely. His rate is controlled today. 9. Cardiomyopathy.  He has been started on Entresto.   10. Right lower lobe pneumonia, resolved.  The will return in 1 week with a CBC and comprehensive metabolic panel prior to a 4th cycle of ipilimumab/nivolumab.  The patient and his wife understand the plans discussed today and are in agreement with them.  They know to contact our office if his  develops concerns prior to his next appointment.   Melodye Ped, NP

## 2020-04-17 NOTE — Telephone Encounter (Signed)
Per 11/15 Kelli, please cancel 11/16 Labs, Follow up [facility] - Keep 11/19 Infusion Appt.  Left Message with patient Regarding requested changes

## 2020-04-18 ENCOUNTER — Inpatient Hospital Stay: Payer: 59

## 2020-04-18 ENCOUNTER — Inpatient Hospital Stay: Payer: 59 | Admitting: Hematology and Oncology

## 2020-04-20 ENCOUNTER — Other Ambulatory Visit: Payer: Self-pay | Admitting: Hematology and Oncology

## 2020-04-20 NOTE — Progress Notes (Signed)
Patient was in for an appointment on 04/18/2020 and his wife left some bills that they had received. Patient has 100% financial assistance through the hospital. I spoke with Samantha Crimes, financial counselor for [employer]. They are going to go back and apply assistance to accounts [id number] and [id number]. Jenni sent reward letter to Shelle Iron at [facility] to get this account covered. I sent information to Antonietta Jewel at [facility] to have visits with Dr. Bobby Rumpf covered. Also faxed [facility] patients reward letter. I did let patients wife [name] know that two of the bills from [facility] I could not get covered. One is a bill for her and the other is a $3 bill for the patient. She stated that she had another bill from [facility] she was going to email me.

## 2020-04-21 ENCOUNTER — Other Ambulatory Visit: Payer: Self-pay

## 2020-04-21 ENCOUNTER — Inpatient Hospital Stay: Payer: 59

## 2020-04-21 VITALS — BP 126/68 | HR 61 | Temp 98.0°F | Resp 20 | Ht 63.0 in | Wt 167.0 lb

## 2020-04-21 DIAGNOSIS — C4359 Malignant melanoma of other part of trunk: Secondary | ICD-10-CM

## 2020-04-21 DIAGNOSIS — Z5112 Encounter for antineoplastic immunotherapy: Secondary | ICD-10-CM | POA: Diagnosis not present

## 2020-04-21 MED ORDER — SODIUM CHLORIDE 0.9 % IV SOLN
3.0000 mg/kg | Freq: Once | INTRAVENOUS | Status: AC
  Administered 2020-04-21: 220 mg via INTRAVENOUS
  Filled 2020-04-21: qty 40

## 2020-04-21 MED ORDER — DIPHENHYDRAMINE HCL 50 MG/ML IJ SOLN
INTRAMUSCULAR | Status: AC
  Filled 2020-04-21: qty 1

## 2020-04-21 MED ORDER — SODIUM CHLORIDE 0.9% FLUSH
10.0000 mL | INTRAVENOUS | Status: DC | PRN
  Filled 2020-04-21: qty 10

## 2020-04-21 MED ORDER — SODIUM CHLORIDE 0.9 % IV SOLN
1.0000 mg/kg | Freq: Once | INTRAVENOUS | Status: AC
  Administered 2020-04-21: 74 mg via INTRAVENOUS
  Filled 2020-04-21: qty 7.4

## 2020-04-21 MED ORDER — SODIUM CHLORIDE 0.9 % IV SOLN
Freq: Once | INTRAVENOUS | Status: AC
  Filled 2020-04-21: qty 250

## 2020-04-21 MED ORDER — FAMOTIDINE IN NACL 20-0.9 MG/50ML-% IV SOLN
20.0000 mg | Freq: Once | INTRAVENOUS | Status: AC
  Administered 2020-04-21: 20 mg via INTRAVENOUS

## 2020-04-21 MED ORDER — DIPHENHYDRAMINE HCL 50 MG/ML IJ SOLN
25.0000 mg | Freq: Once | INTRAMUSCULAR | Status: AC
  Administered 2020-04-21: 25 mg via INTRAVENOUS

## 2020-04-21 MED ORDER — FAMOTIDINE IN NACL 20-0.9 MG/50ML-% IV SOLN
INTRAVENOUS | Status: AC
  Filled 2020-04-21: qty 50

## 2020-04-21 MED ORDER — HEPARIN SOD (PORK) LOCK FLUSH 100 UNIT/ML IV SOLN
500.0000 [IU] | Freq: Once | INTRAVENOUS | Status: AC | PRN
  Administered 2020-04-21: 500 [IU]
  Filled 2020-04-21: qty 5

## 2020-04-21 NOTE — Telephone Encounter (Signed)
Prescription sent

## 2020-04-21 NOTE — Patient Instructions (Signed)
Nivolumab injection What is this medicine? NIVOLUMAB (nye VOL ue mab) is a monoclonal antibody. It is used to treat colon cancer, esophageal cancer, head and neck cancer, Hodgkin lymphoma, kidney cancer, liver cancer, lung cancer, mesothelioma, melanoma, and urothelial cancer. This medicine may be used for other purposes; ask your health care provider or pharmacist if you have questions. COMMON BRAND NAME(S): Opdivo What should I tell my health care provider before I take this medicine? They need to know if you have any of these conditions:  diabetes  immune system problems  kidney disease  liver disease  lung disease  organ transplant  stomach or intestine problems  thyroid disease  an unusual or allergic reaction to nivolumab, other medicines, foods, dyes, or preservatives  pregnant or trying to get pregnant  breast-feeding How should I use this medicine? This medicine is for infusion into a vein. It is given by a health care professional in a hospital or clinic setting. A special MedGuide will be given to you before each treatment. Be sure to read this information carefully each time. Talk to your pediatrician regarding the use of this medicine in children. While this drug may be prescribed for children as young as 12 years for selected conditions, precautions do apply. Overdosage: If you think you have taken too much of this medicine contact a poison control center or emergency room at once. NOTE: This medicine is only for you. Do not share this medicine with others. What if I miss a dose? It is important not to miss your dose. Call your doctor or health care professional if you are unable to keep an appointment. What may interact with this medicine? Interactions have not been studied. Give your health care provider a list of all the medicines, herbs, non-prescription drugs, or dietary supplements you use. Also tell them if you smoke, drink alcohol, or use illegal drugs.  Some items may interact with your medicine. This list may not describe all possible interactions. Give your health care provider a list of all the medicines, herbs, non-prescription drugs, or dietary supplements you use. Also tell them if you smoke, drink alcohol, or use illegal drugs. Some items may interact with your medicine. What should I watch for while using this medicine? This drug may make you feel generally unwell. Continue your course of treatment even though you feel ill unless your doctor tells you to stop. You may need blood work done while you are taking this medicine. Do not become pregnant while taking this medicine or for 5 months after stopping it. Women should inform their doctor if they wish to become pregnant or think they might be pregnant. There is a potential for serious side effects to an unborn child. Talk to your health care professional or pharmacist for more information. Do not breast-feed an infant while taking this medicine or for 5 months after stopping it. What side effects may I notice from receiving this medicine? Side effects that you should report to your doctor or health care professional as soon as possible:  allergic reactions like skin rash, itching or hives, swelling of the face, lips, or tongue  breathing problems  blood in the urine  bloody or watery diarrhea or black, tarry stools  changes in emotions or moods  changes in vision  chest pain  cough  dizziness  feeling faint or lightheaded, falls  fever, chills  headache with fever, neck stiffness, confusion, loss of memory, sensitivity to light, hallucination, loss of contact with reality, or   seizures  joint pain  mouth sores  redness, blistering, peeling or loosening of the skin, including inside the mouth  severe muscle pain or weakness  signs and symptoms of high blood sugar such as dizziness; dry mouth; dry skin; fruity breath; nausea; stomach pain; increased hunger or thirst;  increased urination  signs and symptoms of kidney injury like trouble passing urine or change in the amount of urine  signs and symptoms of liver injury like dark yellow or brown urine; general ill feeling or flu-like symptoms; light-colored stools; loss of appetite; nausea; right upper belly pain; unusually weak or tired; yellowing of the eyes or skin  swelling of the ankles, feet, hands  trouble passing urine or change in the amount of urine  unusually weak or tired  weight gain or loss Side effects that usually do not require medical attention (report to your doctor or health care professional if they continue or are bothersome):  bone pain  constipation  decreased appetite  diarrhea  muscle pain  nausea, vomiting  tiredness This list may not describe all possible side effects. Call your doctor for medical advice about side effects. You may report side effects to FDA at 1-800-FDA-1088. Where should I keep my medicine? This drug is given in a hospital or clinic and will not be stored at home. NOTE: This sheet is a summary. It may not cover all possible information. If you have questions about this medicine, talk to your doctor, pharmacist, or health care provider.  2020 Elsevier/Gold Standard (2019-03-09 10:04:50) Ipilimumab injection What is this medicine? IPILIMUMAB (IP i LIM ue mab) is a monoclonal antibody. It is used to treat colorectal cancer, kidney cancer, liver cancer, lung cancer, melanoma, and mesothelioma. This medicine may be used for other purposes; ask your health care provider or pharmacist if you have questions. COMMON BRAND NAME(S): YERVOY What should I tell my health care provider before I take this medicine? They need to know if you have any of these conditions:  Addison's disease  blood in your stools (black or tarry stools) or if you have blood in your vomit  eye disease, vision problems  history of pancreatitis  history of stomach  bleeding  immune system problems  inflammatory bowel disease  kidney disease  liver disease  lupus  myasthenia gravis  organ transplant  rheumatoid arthritis  sarcoidosis  stomach or intestine problems  thyroid disease  tingling of the fingers or toes, or other nerve disorder  an unusual or allergic reaction to ipilimumab, other medicines, foods, dyes, or preservatives  pregnant or trying to get pregnant  breast-feeding How should I use this medicine? This medicine is for infusion into a vein. It is given by a health care professional in a hospital or clinic setting. A special MedGuide will be given to you before each treatment. Be sure to read this information carefully each time. Talk to your pediatrician regarding the use of this medicine in children. While this drug may be prescribed for children as young as 12 years for selected conditions, precautions do apply. Overdosage: If you think you have taken too much of this medicine contact a poison control center or emergency room at once. NOTE: This medicine is only for you. Do not share this medicine with others. What if I miss a dose? It is important not to miss your dose. Call your doctor or health care professional if you are unable to keep an appointment. What may interact with this medicine? Interactions are not expected.   This list may not describe all possible interactions. Give your health care provider a list of all the medicines, herbs, non-prescription drugs, or dietary supplements you use. Also tell them if you smoke, drink alcohol, or use illegal drugs. Some items may interact with your medicine. What should I watch for while using this medicine? Tell your doctor or healthcare professional if your symptoms do not start to get better or if they get worse. Do not become pregnant while taking this medicine or for 3 months after stopping it. Women should inform their doctor if they wish to become pregnant or  think they might be pregnant. There is a potential for serious side effects to an unborn child. Talk to your health care professional or pharmacist for more information. Do not breast-feed an infant while taking this medicine or for 3 months after the last dose. Your condition will be monitored carefully while you are receiving this medicine. You may need blood work done while you are taking this medicine. What side effects may I notice from receiving this medicine? Side effects that you should report to your doctor or health care professional as soon as possible:  allergic reactions like skin rash, itching or hives, swelling of the face, lips, or tongue  black, tarry stools  bloody or watery diarrhea  changes in vision  dizziness  eye pain  fast, irregular heartbeat  feeling anxious  feeling faint or lightheaded, falls  nausea, vomiting  pain, tingling, numbness in the hands or feet  redness, blistering, peeling or loosening of the skin, including inside the mouth  signs and symptoms of liver injury like dark yellow or brown urine; general ill feeling or flu-like symptoms; light-colored stools; loss of appetite; nausea; right upper belly pain; unusually weak or tired; yellowing of the eyes or skin  unusual bleeding or bruising Side effects that usually do not require medical attention (report to your doctor or health care professional if they continue or are bothersome):  headache  loss of appetite  trouble sleeping This list may not describe all possible side effects. Call your doctor for medical advice about side effects. You may report side effects to FDA at 1-800-FDA-1088. Where should I keep my medicine? This drug is given in a hospital or clinic and will not be stored at home. NOTE: This sheet is a summary. It may not cover all possible information. If you have questions about this medicine, talk to your doctor, pharmacist, or health care provider.  2020  Elsevier/Gold Standard (2019-03-09 10:56:55)  

## 2020-04-21 NOTE — Progress Notes (Signed)
PT STABLE AT TIME OF DISCHARGE 

## 2020-04-25 ENCOUNTER — Other Ambulatory Visit: Payer: Self-pay

## 2020-04-25 DIAGNOSIS — I4891 Unspecified atrial fibrillation: Secondary | ICD-10-CM

## 2020-04-25 MED ORDER — ELIQUIS 2.5 MG PO TABS: 2.5000 mg | ORAL_TABLET | Freq: Two times a day (BID) | ORAL | 2 refills | Status: DC

## 2020-04-25 MED ORDER — METOPROLOL TARTRATE 50 MG PO TABS: 50.0000 mg | ORAL_TABLET | Freq: Two times a day (BID) | ORAL | 1 refills | Status: DC

## 2020-04-25 NOTE — Telephone Encounter (Signed)
Prescriptions sent

## 2020-05-08 NOTE — Progress Notes (Signed)
Adam Bender  [address] [place],  [place]  [address] [phone]  Clinic Day:  05/10/2020  Referring physician: Derwood Kaplan, MD   CHIEF COMPLAINT:  CC: Left axillary pain and increasing melanoma skin lesions.  Current Treatment:  Palliative immunotherapy with ipilimumab/nivolumab.   HISTORY OF PRESENT ILLNESS:  Adam Bender. is a 59 y.o. 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 Michele Mcalpine.  He had several areas of suspicious skin lesions develop, which seem to have worsened.  He had not been following with dermatology routinely.  He presented to the [facility] Emergency Department on August 4th due to a 2 month history of headaches.  These occur 3-4 times per week, usually of the right temporal area, but occasionally the left temporal as well.  The headaches are associated with vertigo upon standing or when looking down, nausea and fatigue.  He denied any visual acuity changes, numbness or tingling.  CT head imaging revealed subcortical white matter edema involving the left temporal lobe concerning for infarction or underlying neoplasm.  Correlating MRI head confirmed homogeneous enhancement at the periphery of the anterior left temporal lobe measuring 1.9 x 1.5 x 2.4 cm.  There is also adjacent parenchymal edema.  Additional periphery of the inferior right temporal lobe measures 1.0 x 1.5 x 0.5 cm also with adjacent parenchymal edema.  A 4 mm focus of enhancement is present at the periphery of the right temporal lobe.  He had numerous black skin lesions scattered over his body which are enlarging.  CT chest, abdomen and pelvis from August 11th revealed enlarged left axillary and subpectoral lymph nodes measuring up to 2.0 x 1.9 cm.  Multiple tiny bilateral pulmonary nodules, nonspecific, were also  observed, the largest measuring 3 mm.  There was no evidence of metastatic disease in the abdomen or pelvis.  He was seen by Dr. Lilia Pro and underwent biopsy and excision of his back and lip lesions on August 10th.  The pathology from these procedures revealed  His melanoma is positive for BRAF.   He received his first dose of immunotherapy with ipilimumab/nivolumab IV every 3 weeks on September 17th and tolerated this well.  However, he presented to the emergency department later that day due to persistent atrial fibrillation and high fever.  He was placed on medications and started on low-dose anticoagulation.  He was admitted with right lower lobe pneumonia and treated accordingly with cefepime and doxycycline.  He was discharged on September 23rd on Cardizem 30 mg TID, Entresto 24/26 BID, digoxin 0.125 mg daily, metoprolol 50 mg BID, Eliquis 2.5 mg BID, doxycycline 100 mg BID #10, and Farxiga 10 mg daily.  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  Some of the lesions are decreasing and scabbing over.      INTERVAL HISTORY:  He is here for follow up prior to a 1st cycle of maintenance nivolumab.  He states that some of his lesions are irritated and some are scabbing over.  He has an especially irritated lesion of the left shoulder, which can bleed due to friction from his shirt.  He also notes tinnitus, which has been long standing.  He continues to follow with Dr. Bettina Gavia and is scheduled with him again on December 14th.  At some point  they will be proceeding with cardioversion.  He has been staying active with walking and hunting.  He has been continuing oral potassium, but has forgotten doses.  Blood counts and chemistries are unremarkable including a potassium of 3.6.  I advised that he continue oral supplement.  His  appetite is good, and he has lost 1 pound since his last visit.  He denies fever, chills or other signs of infection.   He denies nausea, vomiting, bowel issues, or abdominal pain.  He denies sore throat, cough, dyspnea, or chest pain.  REVIEW OF SYSTEMS:  Review of Systems  Constitutional: Negative.   HENT:   Positive for tinnitus.   Eyes: Negative.   Respiratory: Negative.   Cardiovascular: Negative.   Gastrointestinal: Negative.   Endocrine: Negative.   Genitourinary: Negative.    Musculoskeletal: Negative.   Skin: Negative.        Skin lesions which are now irritated  Neurological: Negative.   Hematological: Negative.   Psychiatric/Behavioral: Negative.   All other systems reviewed and are negative.    VITALS:  Blood pressure (!) 146/88, pulse 72, temperature 98.2 F (36.8 C), temperature source Oral, resp. rate 18, height _0  (1.6 m), weight 166 lb 3.2 oz (75.4 kg), SpO2 95 %.  Wt Readings from Last 3 Encounters:  05/10/20 166 lb 3.2 oz (75.4 kg)  04/21/20 167 lb (75.8 kg)  04/14/20 164 lb 14.4 oz (74.8 kg)    Body mass index is 29.44 kg/m.  Performance status (ECOG): 1 - Symptomatic but completely ambulatory  PHYSICAL EXAM:  Physical Exam Constitutional:      General: He is not in acute distress.    Appearance: Normal appearance. He is normal weight.  HENT:     Head: Normocephalic and atraumatic.  Eyes:     General: No scleral icterus.    Extraocular Movements: Extraocular movements intact.     Conjunctiva/sclera: Conjunctivae normal.     Pupils: Pupils are equal, round, and reactive to light.  Cardiovascular:     Rate and Rhythm: Normal rate and regular rhythm.     Pulses: Normal pulses.     Heart sounds: Normal heart sounds. No murmur heard.  No friction rub. No gallop.   Pulmonary:     Effort: Pulmonary effort is normal. No respiratory distress.     Breath sounds: Normal breath sounds.  Abdominal:     General: Bowel sounds are normal. There is no distension.     Palpations: Abdomen is soft. There is no mass.     Tenderness: There is no abdominal tenderness.   Musculoskeletal:        General: Normal range of motion.     Cervical back: Normal range of motion and neck supple.     Right lower leg: No edema.     Left lower leg: No edema.  Lymphadenopathy:     Cervical: No cervical adenopathy.  Skin:    General: Skin is warm and dry.     Comments: He has a pedunculated nodule of the left shoulder measuring at least 2-3 cm across, which is irritated.  He still has multiple black skin lesions consistent with melanoma measuring up to 1 cm distributed over his trunk and extremities.  Neurological:     General: No focal deficit present.     Mental Status: He is alert and oriented to person, place, and time. Mental status is at baseline.  Psychiatric:        Mood and Affect: Mood normal.  Behavior: Behavior normal.        Thought Content: Thought content normal.        Judgment: Judgment normal.      LABS:   CBC Latest Ref Rng & Units 04/14/2020 03/30/2020 03/09/2020  WBC 4.0 - 10.5 K/uL 10.0 12.6 13.6  Hemoglobin 13.0 - 17.0 g/dL 15.2 16.3 14.9  Hematocrit 39 - 52 % 46.4 50 45  Platelets 150 - 400 K/uL 363 334 349   CMP Latest Ref Rng & Units 04/14/2020 03/30/2020 03/09/2020  Glucose 70 - 99 mg/dL 117(H) - -  BUN 6 - 20 mg/dL _0 Creatinine 0.61 - 1.24 mg/dL 1.07 0.9 1.0  Sodium 135 - 145 mmol/L 138 140 141  Potassium 3.5 - 5.1 mmol/L 2.9(L) 4.1 3.9  Chloride 98 - 111 mmol/L 104 110(A) 107  CO2 22 - 32 mmol/L _1 Calcium 8.9 - 10.3 mg/dL 9.3 9.1 9.4  Total Protein 6.5 - 8.1 g/dL 7.6 - -  Total Bilirubin 0.3 - 1.2 mg/dL 0.7 - -  Alkaline Phos 38 - 126 U/L 110 129(A) 123  AST 15 - 41 U/L 23 34 31  ALT 0 - 44 U/L 21 32 38     STUDIES:  No results found.    HISTORY:   Allergies: No Known Allergies  Current Medications: Current Outpatient Medications  Medication Sig Dispense Refill  . benzonatate (TESSALON) 100 MG capsule Take 100 mg by mouth 3 (three) times daily as needed.    . digoxin (LANOXIN) 0.125 MG  tablet Take 1 tablet (125 mcg total) by mouth daily. 30 tablet 5  . diltiazem (CARDIZEM) 30 MG tablet TAKE 1 TABLET BY MOUTH THREE TIMES DAILY 90 tablet 0  . ELIQUIS 2.5 MG TABS tablet Take 1 tablet (2.5 mg total) by mouth 2 (two) times daily. 60 tablet 2  . empagliflozin (JARDIANCE) 10 MG TABS tablet Take 1 tablet (10 mg total) by mouth daily before breakfast. 30 tablet 6  . furosemide (LASIX) 20 MG tablet Take 1 tablet (20 mg total) by mouth daily. 90 tablet 3  . HYDROcodone-acetaminophen (NORCO/VICODIN) 5-325 MG tablet Take 1 tablet by mouth every 4 (four) hours as needed. for pain    . metoprolol tartrate (LOPRESSOR) 50 MG tablet Take 1 tablet (50 mg total) by mouth 2 (two) times daily. 60 tablet 1  . oxyCODONE (OXY IR/ROXICODONE) 5 MG immediate release tablet Take 5 mg by mouth every 8 (eight) hours as needed.    . sacubitril-valsartan (ENTRESTO) 24-26 MG Take 1 tablet by mouth 2 (two) times daily. 60 tablet 3   No current facility-administered medications for this visit.     ASSESSMENT & PLAN:   Assessment/Plan: 1. History of melanoma of the right upper abdominal wall in 2011, stage IA. This was treated with resection/wide excision with Dr. Michele Mcalpine.  This area remains without evidence of recurrence.    2.  New enhancement of the bilateral temporal lobes as seen on recent MRI imaging.  The left measures 1.9 x 1.5 x 2.4 cm, and the right measures 1.0 x 1.5 x 0.5 cm.  There is also associated parenchymal edema and possible abnormal leptomeningeal enhancement.  We feel this is metastatic melanoma but he is essentially asymptomatic.    3.  Enlarged left axillary and subpectoral metastatic lymph nodes measuring up to 2.0 x 1.9 cm.  4.  Innumerable dark and pedunculated moles all over the body, which have worsened over the past few months, and clearly  appear consistent with metastatic melanoma.  The largest measures about 3 cm of the middle back.  He has undergone biopsy and excision of two of  these.  He continues immunotherapy with maintenance nivolumab and is tolerating it well.  There has been a slight increase in the lesion of his left shoulder and right lower extremity, which I feel is treatment effect.  We will continue to monitor this.  5.  Intermittent severe headaches, much improved since being treated for pneumonia.  He uses Tylenol or ibuprofen when needed.    6.  Multiple tiny bilateral pulmonary nodules, nonspecific.  These are likely benign, but could represent early metastatic disease.  We will continue to monitor.    7.  Positive BRAF mutation.  8. New onset atrial fibrillation, which occurred even prior to starting his therapy.  We really do not know how long this has been going on.  He is on medications and low dose anticoagulation.  There is certainly risk involved whether we do or do not anticoagulate, but we do feel comfortable with a low dose and have discussed this with Dr. Bettina Gavia.  We will monitor his neurologic status closely. His rate is controlled today.  9. Cardiomyopathy.  He has been started on Entresto.    10. Right lower lobe pneumonia, resolved.  11.Hypokalemia, due to his furosemide.  This has improved with oral supplement.    Plan: As he is doing well, I will have him proceed with his 1st cycle of maintenance nivolumab on December 10th.  He knows to continue oral potassium supplement.  We will then see him in 4 weeks with CBC, CMP, CT chest, abdomen and pelvis and brain MRI prior to his next cycle.  The patient and his wife understand the plans discussed today and are in agreement with them.  They know to contact our office if his develops concerns prior to his next appointment.   I provided 25 minutes of face-to-face time during this this encounter and > 50% was spent counseling as documented under my assessment and plan.    Derwood Kaplan, MD [facility] [place] [address] Dept: [phone] Dept Fax: [phone]     I, Rita Ohara, am acting as scribe for Derwood Kaplan, MD  I have reviewed this report as typed by the medical scribe, and it is complete and accurate.

## 2020-05-10 ENCOUNTER — Inpatient Hospital Stay: Payer: 59 | Attending: Oncology

## 2020-05-10 ENCOUNTER — Other Ambulatory Visit: Payer: Self-pay | Admitting: Oncology

## 2020-05-10 ENCOUNTER — Other Ambulatory Visit: Payer: Self-pay

## 2020-05-10 ENCOUNTER — Inpatient Hospital Stay (INDEPENDENT_AMBULATORY_CARE_PROVIDER_SITE_OTHER): Payer: 59 | Admitting: Oncology

## 2020-05-10 ENCOUNTER — Encounter: Payer: Self-pay | Admitting: Oncology

## 2020-05-10 ENCOUNTER — Other Ambulatory Visit: Payer: Self-pay | Admitting: Hematology and Oncology

## 2020-05-10 ENCOUNTER — Telehealth: Payer: Self-pay | Admitting: Oncology

## 2020-05-10 VITALS — BP 146/88 | HR 72 | Temp 98.2°F | Resp 18 | Ht 63.0 in | Wt 166.2 lb

## 2020-05-10 DIAGNOSIS — E876 Hypokalemia: Secondary | ICD-10-CM | POA: Diagnosis not present

## 2020-05-10 DIAGNOSIS — Z5112 Encounter for antineoplastic immunotherapy: Secondary | ICD-10-CM | POA: Diagnosis not present

## 2020-05-10 DIAGNOSIS — C4359 Malignant melanoma of other part of trunk: Secondary | ICD-10-CM

## 2020-05-10 DIAGNOSIS — Z79899 Other long term (current) drug therapy: Secondary | ICD-10-CM | POA: Diagnosis not present

## 2020-05-10 DIAGNOSIS — C778 Secondary and unspecified malignant neoplasm of lymph nodes of multiple regions: Secondary | ICD-10-CM | POA: Insufficient documentation

## 2020-05-10 DIAGNOSIS — I429 Cardiomyopathy, unspecified: Secondary | ICD-10-CM | POA: Diagnosis not present

## 2020-05-10 DIAGNOSIS — C439 Malignant melanoma of skin, unspecified: Secondary | ICD-10-CM | POA: Insufficient documentation

## 2020-05-10 DIAGNOSIS — C7931 Secondary malignant neoplasm of brain: Secondary | ICD-10-CM

## 2020-05-10 DIAGNOSIS — R918 Other nonspecific abnormal finding of lung field: Secondary | ICD-10-CM | POA: Diagnosis not present

## 2020-05-10 DIAGNOSIS — C792 Secondary malignant neoplasm of skin: Secondary | ICD-10-CM | POA: Diagnosis not present

## 2020-05-10 DIAGNOSIS — Z7901 Long term (current) use of anticoagulants: Secondary | ICD-10-CM | POA: Insufficient documentation

## 2020-05-10 DIAGNOSIS — I4891 Unspecified atrial fibrillation: Secondary | ICD-10-CM | POA: Diagnosis not present

## 2020-05-10 LAB — BASIC METABOLIC PANEL
BUN: 7 (ref 4–21)
CO2: 24 — AB (ref 13–22)
Chloride: 108 (ref 99–108)
Creatinine: 1 (ref 0.6–1.3)
Glucose: 136
Potassium: 3.6 (ref 3.4–5.3)
Sodium: 141 (ref 137–147)

## 2020-05-10 LAB — CBC AND DIFFERENTIAL
HCT: 47 (ref 41–53)
Hemoglobin: 16 (ref 13.5–17.5)
Neutrophils Absolute: 5.14
Platelets: 327 (ref 150–399)
WBC: 9.7

## 2020-05-10 LAB — HEPATIC FUNCTION PANEL
ALT: 26 (ref 10–40)
AST: 29 (ref 14–40)
Alkaline Phosphatase: 132 — AB (ref 25–125)
Bilirubin, Total: 0.6

## 2020-05-10 LAB — CBC: RBC: 5.21 — AB (ref 3.87–5.11)

## 2020-05-10 LAB — COMPREHENSIVE METABOLIC PANEL
Albumin: 4.1 (ref 3.5–5.0)
Calcium: 9 (ref 8.7–10.7)

## 2020-05-10 LAB — TSH: TSH: 2.13 u[IU]/mL (ref 0.350–4.500)

## 2020-05-10 NOTE — Telephone Encounter (Signed)
Per 12/8 los next appt given to patient

## 2020-05-11 LAB — T4: T4, Total: 6.5 ug/dL (ref 4.5–12.0)

## 2020-05-12 ENCOUNTER — Inpatient Hospital Stay: Payer: 59

## 2020-05-12 ENCOUNTER — Other Ambulatory Visit: Payer: Self-pay

## 2020-05-12 VITALS — BP 126/80 | HR 65 | Temp 98.1°F | Resp 18 | Ht 63.0 in | Wt 168.2 lb

## 2020-05-12 DIAGNOSIS — Z5112 Encounter for antineoplastic immunotherapy: Secondary | ICD-10-CM | POA: Diagnosis not present

## 2020-05-12 DIAGNOSIS — C4359 Malignant melanoma of other part of trunk: Secondary | ICD-10-CM

## 2020-05-12 MED ORDER — SODIUM CHLORIDE 0.9 % IV SOLN
Freq: Once | INTRAVENOUS | Status: AC
  Filled 2020-05-12: qty 250

## 2020-05-12 MED ORDER — HEPARIN SOD (PORK) LOCK FLUSH 100 UNIT/ML IV SOLN
500.0000 [IU] | Freq: Once | INTRAVENOUS | Status: AC | PRN
  Administered 2020-05-12: 500 [IU]
  Filled 2020-05-12: qty 5

## 2020-05-12 MED ORDER — SODIUM CHLORIDE 0.9 % IV SOLN
480.0000 mg | Freq: Once | INTRAVENOUS | Status: AC
  Administered 2020-05-12: 480 mg via INTRAVENOUS
  Filled 2020-05-12: qty 48

## 2020-05-12 NOTE — Patient Instructions (Signed)
Nivolumab injection What is this medicine? NIVOLUMAB (nye VOL ue mab) is a monoclonal antibody. It is used to treat colon cancer, esophageal cancer, head and neck cancer, Hodgkin lymphoma, kidney cancer, liver cancer, lung cancer, mesothelioma, melanoma, and urothelial cancer. This medicine may be used for other purposes; ask your health care provider or pharmacist if you have questions. COMMON BRAND NAME(S): Opdivo What should I tell my health care provider before I take this medicine? They need to know if you have any of these conditions:  diabetes  immune system problems  kidney disease  liver disease  lung disease  organ transplant  stomach or intestine problems  thyroid disease  an unusual or allergic reaction to nivolumab, other medicines, foods, dyes, or preservatives  pregnant or trying to get pregnant  breast-feeding How should I use this medicine? This medicine is for infusion into a vein. It is given by a health care professional in a hospital or clinic setting. A special MedGuide will be given to you before each treatment. Be sure to read this information carefully each time. Talk to your pediatrician regarding the use of this medicine in children. While this drug may be prescribed for children as young as 12 years for selected conditions, precautions do apply. Overdosage: If you think you have taken too much of this medicine contact a poison control center or emergency room at once. NOTE: This medicine is only for you. Do not share this medicine with others. What if I miss a dose? It is important not to miss your dose. Call your doctor or health care professional if you are unable to keep an appointment. What may interact with this medicine? Interactions have not been studied. Give your health care provider a list of all the medicines, herbs, non-prescription drugs, or dietary supplements you use. Also tell them if you smoke, drink alcohol, or use illegal drugs.  Some items may interact with your medicine. This list may not describe all possible interactions. Give your health care provider a list of all the medicines, herbs, non-prescription drugs, or dietary supplements you use. Also tell them if you smoke, drink alcohol, or use illegal drugs. Some items may interact with your medicine. What should I watch for while using this medicine? This drug may make you feel generally unwell. Continue your course of treatment even though you feel ill unless your doctor tells you to stop. You may need blood work done while you are taking this medicine. Do not become pregnant while taking this medicine or for 5 months after stopping it. Women should inform their doctor if they wish to become pregnant or think they might be pregnant. There is a potential for serious side effects to an unborn child. Talk to your health care professional or pharmacist for more information. Do not breast-feed an infant while taking this medicine or for 5 months after stopping it. What side effects may I notice from receiving this medicine? Side effects that you should report to your doctor or health care professional as soon as possible:  allergic reactions like skin rash, itching or hives, swelling of the face, lips, or tongue  breathing problems  blood in the urine  bloody or watery diarrhea or black, tarry stools  changes in emotions or moods  changes in vision  chest pain  cough  dizziness  feeling faint or lightheaded, falls  fever, chills  headache with fever, neck stiffness, confusion, loss of memory, sensitivity to light, hallucination, loss of contact with reality, or   seizures  joint pain  mouth sores  redness, blistering, peeling or loosening of the skin, including inside the mouth  severe muscle pain or weakness  signs and symptoms of high blood sugar such as dizziness; dry mouth; dry skin; fruity breath; nausea; stomach pain; increased hunger or thirst;  increased urination  signs and symptoms of kidney injury like trouble passing urine or change in the amount of urine  signs and symptoms of liver injury like dark yellow or brown urine; general ill feeling or flu-like symptoms; light-colored stools; loss of appetite; nausea; right upper belly pain; unusually weak or tired; yellowing of the eyes or skin  swelling of the ankles, feet, hands  trouble passing urine or change in the amount of urine  unusually weak or tired  weight gain or loss Side effects that usually do not require medical attention (report to your doctor or health care professional if they continue or are bothersome):  bone pain  constipation  decreased appetite  diarrhea  muscle pain  nausea, vomiting  tiredness This list may not describe all possible side effects. Call your doctor for medical advice about side effects. You may report side effects to FDA at 1-800-FDA-1088. Where should I keep my medicine? This drug is given in a hospital or clinic and will not be stored at home. NOTE: This sheet is a summary. It may not cover all possible information. If you have questions about this medicine, talk to your doctor, pharmacist, or health care provider.  2020 Elsevier/Gold Standard (2019-03-09 10:04:50)  

## 2020-05-12 NOTE — Progress Notes (Signed)
PT STABLE AT TIME OF DISCHARGE 

## 2020-05-15 NOTE — Progress Notes (Signed)
Cardiology Office Note:    Date:  05/16/2020   ID:  Kathrine Cords., DOB 1961-01-17, MRN [id number]  PCP:  Patient, No Pcp Per  Cardiologist:  Shirlee More, MD    Referring MD: No ref. provider found    ASSESSMENT:    1. Persistent atrial fibrillation ([place])   2. Chronic anticoagulation   3. High risk medication use   4. Chronic systolic heart failure (HCC)    PLAN:    In order of problems listed above:  1. He is doing well rate is controlled we will begin to withdraw the calcium channel blocker continue his current anticoagulant uptitrate Entresto stop pause and check echocardiogram for ejection fraction.  I would be hesitant to electively cardiovert him unless he could be fully anticoagulated 1 month before in 1 month after.  Echocardiogram schedule my office. 2. I will asked Dr. Hinton Rao to do a digoxin level with his next labs 3. Start oral potassium 20 mEq daily with diuretic therapy and digoxin.   Next appointment: 3 months   Medication Adjustments/Labs and Tests Ordered: Current medicines are reviewed at length with the patient today.  Concerns regarding medicines are outlined above.  No orders of the defined types were placed in this encounter.  No orders of the defined types were placed in this encounter.   Chief Complaint  Patient presents with  . Follow-up  . Atrial Fibrillation  . Anticoagulation  . Congestive Heart Failure    History of Present Illness:    Kaison Mcparland. is a 59 y.o. male with a hx of persistent atrial fibrillation anticoagulated on digoxin with cardiomyopathy EF 35 to 40% melanoma with CNS metastasis and chemotherapy  seen 03/24/2020 with recurrent decompensated heart failure associated with interruption of guideline directed treatment due to financial restriction.Marland Kitchen  He was last seen 04/12/2020.  Compliance with diet, lifestyle and medications: Yes  He is tolerating his chemotherapy well and appears to be responding with  follow-up extensive CT scheduled. He tolerates his heart medications for rate control of atrial fibrillation anticoagulant bleeding and guideline directed therapy for cardiomyopathy He is having no edema shortness of breath palpitations syncope. He is concerned about a bulky melanoma left shoulder which is irritating and bleeding and I asked him to discuss with Dr. Orrin Brigham surgery. Past Medical History:  Diagnosis Date  . Atrial fibrillation ([place]) 02/29/2020  . Depressed left ventricular ejection fraction 02/29/2020  . 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 MOLE REMOVAL    . SALIVARY GLAND SURGERY      Current Medications: Current Meds  Medication Sig  . benzonatate (TESSALON) 100 MG capsule Take 100 mg by mouth 3 (three) times daily as needed.  . digoxin (LANOXIN) 0.125 MG tablet Take 1 tablet (125 mcg total) by mouth daily.  Marland Kitchen diltiazem (CARDIZEM) 30 MG tablet TAKE 1 TABLET BY MOUTH THREE TIMES DAILY  . ELIQUIS 2.5 MG TABS tablet Take 1 tablet (2.5 mg total) by mouth 2 (two) times daily.  . empagliflozin (JARDIANCE) 10 MG TABS tablet Take 1 tablet (10 mg total) by mouth daily before breakfast.  . furosemide (LASIX) 20 MG tablet Take 1 tablet (20 mg total) by mouth daily.  Marland Kitchen HYDROcodone-acetaminophen (NORCO/VICODIN) 5-325 MG tablet Take 1 tablet by mouth every 4 (four) hours as needed. for pain  . metoprolol tartrate (LOPRESSOR) 50 MG tablet Take 1 tablet (50 mg total) by mouth  2 (two) times daily.  Marland Kitchen oxyCODONE (OXY IR/ROXICODONE) 5 MG immediate release tablet Take 5 mg by mouth every 8 (eight) hours as needed.  . sacubitril-valsartan (ENTRESTO) 24-26 MG Take 1 tablet by mouth 2 (two) times daily.     Allergies:   Patient has no known allergies.   Social History   Socioeconomic History  . Marital status: Married    Spouse name: Not on file  . Number of children: Not on file  . Years of education:  Not on file  . Highest education level: Not on file  Occupational History  . Not on file  Tobacco Use  . Smoking status: Former Research scientist (life sciences)  . Smokeless tobacco: Never Used  Substance and Sexual Activity  . Alcohol use: Not on file  . Drug use: Not on file  . Sexual activity: Not on file  Other Topics Concern  . Not on file  Social History Narrative  . Not on file   Social Determinants of Health   Financial Resource Strain: Not on file  Food Insecurity: Not on file  Transportation Needs: Not on file  Physical Activity: Not on file  Stress: Not on file  Social Connections: Not on file     Family History: The patient's family history includes Hypertension in his father and mother. ROS:   Please see the history of present illness.    All other systems reviewed and are negative.  EKGs/Labs/Other Studies Reviewed:    The following studies were reviewed today:  EKG:  EKG ordered today and personally reviewed.  The ekg ordered today demonstrates AF controlled VR  Recent Labs: 02/29/2020: Magnesium 2.0 05/10/2020: ALT 26; BUN 7; Creatinine 1.0; Hemoglobin 16.0; Platelets 327; Potassium 3.6; Sodium 141; TSH 2.130  His wife [employer] participates in evaluation decision making tells me recent potassium was low normal 3.5 Recent Lipid Panel No results found for: CHOL, TRIG, HDL, CHOLHDL, VLDL, LDLCALC, LDLDIRECT  Physical Exam:    VS:  BP 116/70   Pulse (!) 58   Ht 5\' 3"  (1.6 m)   Wt 166 lb (75.3 kg)   SpO2 99%   BMI 29.41 kg/m     Wt Readings from Last 3 Encounters:  05/16/20 166 lb (75.3 kg)  05/12/20 168 lb 4 oz (76.3 kg)  05/10/20 166 lb 3.2 oz (75.4 kg)     GEN:  Well nourished, well developed in no acute distress HEENT: Normal NECK: No JVD; No carotid bruits LYMPHATICS: No lymphadenopathy CARDIAC: RRR, no murmurs, rubs, gallops RESPIRATORY:  Clear to auscultation without rales, wheezing or rhonchi  ABDOMEN: Soft, non-tender,  non-distended MUSCULOSKELETAL:  No edema; No deformity  SKIN: Warm and dry NEUROLOGIC:  Alert and oriented x 3 PSYCHIATRIC:  Normal affect    Signed, Shirlee More, MD  05/16/2020 1:38 PM    [facility]

## 2020-05-16 ENCOUNTER — Encounter: Payer: Self-pay | Admitting: Cardiology

## 2020-05-16 ENCOUNTER — Other Ambulatory Visit: Payer: Self-pay | Admitting: Oncology

## 2020-05-16 ENCOUNTER — Ambulatory Visit (INDEPENDENT_AMBULATORY_CARE_PROVIDER_SITE_OTHER): Payer: 59 | Admitting: Cardiology

## 2020-05-16 ENCOUNTER — Other Ambulatory Visit: Payer: Self-pay

## 2020-05-16 VITALS — BP 116/70 | HR 58 | Ht 63.0 in | Wt 166.0 lb

## 2020-05-16 DIAGNOSIS — I5022 Chronic systolic (congestive) heart failure: Secondary | ICD-10-CM

## 2020-05-16 DIAGNOSIS — I4891 Unspecified atrial fibrillation: Secondary | ICD-10-CM

## 2020-05-16 DIAGNOSIS — Z7901 Long term (current) use of anticoagulants: Secondary | ICD-10-CM

## 2020-05-16 DIAGNOSIS — I4819 Other persistent atrial fibrillation: Secondary | ICD-10-CM | POA: Diagnosis not present

## 2020-05-16 DIAGNOSIS — Z79899 Other long term (current) drug therapy: Secondary | ICD-10-CM

## 2020-05-16 MED ORDER — DILTIAZEM HCL 30 MG PO TABS: 30.0000 mg | ORAL_TABLET | Freq: Two times a day (BID) | ORAL | 3 refills | Status: DC

## 2020-05-16 MED ORDER — ENTRESTO 49-51 MG PO TABS: 1.0000 | ORAL_TABLET | Freq: Two times a day (BID) | ORAL | 3 refills | Status: DC

## 2020-05-16 MED ORDER — POTASSIUM CHLORIDE CRYS ER 20 MEQ PO TBCR: 20.0000 meq | EXTENDED_RELEASE_TABLET | Freq: Every day | ORAL | 3 refills | Status: DC

## 2020-05-16 NOTE — Patient Instructions (Signed)
Medication Instructions:  Your physician has recommended you make the following change in your medication:  STOP: Your over the counter potassium START: Potassium 20 meq take one tablet by mouth daily.  DECREASE: Cardizem 30 mg take one tablet by mouth twice daily.  INCREASE: Entresto 49/51 mg take one tablet by mouth twice daily.  *If you need a refill on your cardiac medications before your next appointment, please call your pharmacy*   Lab Work: None If you have labs (blood work) drawn today and your tests are completely normal, you will receive your results only by: Marland Kitchen MyChart Message (if you have MyChart) OR . A paper copy in the mail If you have any lab test that is abnormal or we need to change your treatment, we will call you to review the results.   Testing/Procedures: Your physician has requested that you have an echocardiogram. Echocardiography is a painless test that uses sound waves to create images of your heart. It provides your doctor with information about the size and shape of your heart and how well your heart's chambers and valves are working. This procedure takes approximately one hour. There are no restrictions for this procedure.     Follow-Up: At [facility], you and your health needs are our priority.  As part of our continuing mission to provide you with exceptional heart care, we have created designated Provider Care Teams.  These Care Teams include your primary Cardiologist (physician) and Advanced Practice Providers (APPs -  Physician Assistants and Nurse Practitioners) who all work together to provide you with the care you need, when you need it.  We recommend signing up for the patient portal called "MyChart".  Sign up information is provided on this After Visit Summary.  MyChart is used to connect with patients for Virtual Visits (Telemedicine).  Patients are able to view lab/test results, encounter notes, upcoming appointments, etc.  Non-urgent messages  can be sent to your provider as well.   To learn more about what you can do with MyChart, go to [email or web address].    Your next appointment:   3 month(s)  The format for your next appointment:   In Person  Provider:   Shirlee More, MD   Other Instructions

## 2020-05-24 ENCOUNTER — Telehealth: Payer: Self-pay

## 2020-05-24 NOTE — Progress Notes (Signed)
PT STABLE AT TIME OF DISCHARGE 

## 2020-05-24 NOTE — Telephone Encounter (Addendum)
I spoke with pt's wife, & notified her of below.  ----- Message from Marvia Pickles, PA-C sent at 05/24/2020  4:55 PM EST ----- Regarding: RE: Peristent severe cough per pt's wife If you could just let them know, [facility] is working on it! ----- Message ----- From: Dairl Ponder, RN Sent: 05/24/2020   4:30 PM EST To: Marvia Pickles, PA-C Subject: RE: Peristent severe cough per pt's wife       Should I call her back & tell them, or have you?  ----- Message ----- From: Marvia Pickles, PA-C Sent: 05/24/2020   4:15 PM EST To: Dairl Ponder, RN Subject: RE: Peristent severe cough per pt's wife       Trying to get authorization for CT moved up instead of starting from scratch ----- Message ----- From: Dairl Ponder, RN Sent: 05/24/2020   4:08 PM EST To: Marvia Pickles, PA-C Subject: RE: Peristent severe cough per pt's wife        ----- Message ----- From: Marvia Pickles, PA-C Sent: 05/24/2020  12:17 PM EST To: Dairl Ponder, RN Subject: RE: Peristent severe cough per pt's wife       When we say cold, do we mean runny nose, sinus drainage? Is cough dry? Did the cough syrup from Urgent care help? Could be due to his treatment. If he is short of breath, we need to move up his scans. ----- Message ----- From: Dairl Ponder, RN Sent: 05/24/2020  11:50 AM EST To: Marvia Pickles, PA-C Subject: Peristent severe cough per pt's wife           Pt's wife, [name], called to give [place] an update on pt condition. Pt has had cold & cough for several weeks. He has been seen by Urgent Care & given antibiotics (completed) & cough med. She states  his cough is still severe, and he is taking the tessalon pearles we gave him without much help. She wants to make sure you don't have any other recommendations on something else they could be doing. She is concerned he will end up with pneumonia again. I wanted to touch base with you all before the holidays.  I called [name] to get few more  assessment questions answered. Pt is afebrile, cough is productive @ times - white, thick, mucous in appearance. He tried Mucinex before Urgent Care visit, but didn't help much. No hemoptysis. COVID test was negative @ Urgent Care visit. SOB only when has coughing spell. Urgent care @ [place] gave him cough syrup, & she couldn't remember name of antibiotic (again completed). No sore throat. No N/V. He did have a little diarrhea while on those meds.   [phone]

## 2020-05-25 ENCOUNTER — Telehealth: Payer: Self-pay | Admitting: Hematology and Oncology

## 2020-05-25 ENCOUNTER — Telehealth: Payer: Self-pay

## 2020-05-25 NOTE — Telephone Encounter (Signed)
Per Vida Roller on 12/22, order STAT CT Chest on patient 12/23.  Per Cyril Mourning in Scheduling.  Short staff and cannot do until Monday.  Relayed Information to [place].  Tell Amy of the situation - have patient either go to ED or just scheduled for Monday.  We already have the Authorization  Per Amy, patient will be going to the ED

## 2020-05-25 NOTE — Telephone Encounter (Signed)
Per [facility] scheduled patient for CT Chest at [facility] on 12/27.  Patient scheduled at 8:45 am on 12/27.  Patient notified and given instructions

## 2020-05-25 NOTE — Telephone Encounter (Signed)
Adam Bender, request that I call the ER, & give them brief report on pt's condition. I spoke with Placido Sou, triage nurse, @ ext [phone]. I notified Laderra,RN, that pt has severe persistent cough, even after treatment @ Urgent Care for antibiotics & cough med. Pt also reports SOB on exertion & while coughing. Pt is currently receiving immunotherapy, &  one of the possible side effects is pneumonitis. Pt has hx of PNA in recent past. Laderra,RN, verbalized understanding & states they will get him checked out once he arrives.

## 2020-05-31 ENCOUNTER — Other Ambulatory Visit: Payer: Self-pay | Admitting: Pharmacist

## 2020-05-31 ENCOUNTER — Encounter: Payer: Self-pay | Admitting: Hematology and Oncology

## 2020-06-04 ENCOUNTER — Other Ambulatory Visit: Payer: Self-pay | Admitting: Oncology

## 2020-06-05 NOTE — Progress Notes (Signed)
[facility]  [address] [place],  [place]  [address] [phone]  Clinic Day:  06/07/2020  Referring physician: Derwood Kaplan, MD   CHIEF COMPLAINT:  CC: Left axillary pain and increasing melanoma skin lesions.  Current Treatment:  Palliative immunotherapy with ipilimumab/nivolumab.   HISTORY OF PRESENT ILLNESS:  Lyriq Jarchow. is a 60 y.o. 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 Michele Mcalpine.  He had several areas of suspicious skin lesions develop, which seem to have worsened.  He had not been following with dermatology routinely.  He presented to the [facility] Emergency Department on August 4th due to a 2 month history of headaches.  These occur 3-4 times per week, usually of the right temporal area, but occasionally the left temporal as well.  The headaches are associated with vertigo upon standing or when looking down, nausea and fatigue.  He denied any visual acuity changes, numbness or tingling.  CT head imaging revealed subcortical white matter edema involving the left temporal lobe concerning for infarction or underlying neoplasm.  Correlating MRI head confirmed homogeneous enhancement at the periphery of the anterior left temporal lobe measuring 1.9 x 1.5 x 2.4 cm.  There is also adjacent parenchymal edema.  Additional periphery of the inferior right temporal lobe measures 1.0 x 1.5 x 0.5 cm also with adjacent parenchymal edema.  A 4 mm focus of enhancement is present at the periphery of the right temporal lobe.  He had numerous black skin lesions scattered over his body which are enlarging.  CT chest, abdomen and pelvis from August 11th revealed enlarged left axillary and subpectoral lymph nodes measuring up to 2.0 x 1.9 cm.  Multiple tiny bilateral pulmonary nodules, nonspecific, were also  observed, the largest measuring 3 mm.  There was no evidence of metastatic disease in the abdomen or pelvis.  He was seen by Dr. Lilia Pro and underwent biopsy and excision of his back and lip lesions on August 10th.  The pathology from these procedures revealed  His melanoma is positive for BRAF.   He received his first dose of immunotherapy with ipilimumab/nivolumab IV every 3 weeks on September 17th and tolerated this well.  However, he presented to the emergency department later that day due to persistent atrial fibrillation and high fever.  He was placed on medications and started on low-dose anticoagulation.  He was admitted with right lower lobe pneumonia and treated accordingly with cefepime and doxycycline.  He was discharged on September 23rd on Cardizem 30 mg TID, Entresto 24/26 BID, digoxin 0.125 mg daily, metoprolol 50 mg BID, Eliquis 2.5 mg BID, doxycycline 100 mg BID #10, and Farxiga 10 mg daily.  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  Some of the lesions are decreasing and scabbing over.      INTERVAL HISTORY:  He is here for follow up prior to a 2nd cycle of maintenance nivolumab. CT chest from December 27th revealed interval growth of bulky heterogeneously enhancing left axillary and left retropectoral lymphadenopathy, as well as new left hilar lymphadenopathy.  There is also new subcentimeter pulmonary nodules in the lungs bilaterally, indeterminate for pulmonary metastases, and new small subcutaneous soft tissue nodule in the ventral upper left chest wall, suspicious for subcutaneous metastasis.  Indistinct clustered subcapsular foci of hyperenhancement  in the peripheral inferior right liver lobe, not convincingly changed, indeterminate, was also seen.  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  I cannot rule out  progression of his melanoma, but time will tell.  We will be better able to determine this with his next imaging.  He states that most of his lesions remain the same, but the one of the left shoulder bleeds intermittently.  We will contact Dr. Lilia Pro so that this can be removed as it is quite large and pedunculated.  Blood counts and chemistries are unremarkable except for a white count of 13.5.  He has had some sinus congestion for the past 2 weeks which is resolving.  He denies fever or purulent sputum.  His  appetite is good, and he has lost 3 pounds since his last visit.  He denies chills or other signs of infection.  He denies nausea, vomiting, bowel issues, or abdominal pain.  He denies sore throat, cough, dyspnea, or chest pain.  REVIEW OF SYSTEMS:  Review of Systems  Constitutional: Negative.  Negative for chills and fever.  HENT:  Negative.        Sinus congestion, resolving  Eyes: Negative.   Respiratory: Negative.   Cardiovascular: Negative.   Gastrointestinal: Negative.   Endocrine: Negative.   Genitourinary: Negative.    Musculoskeletal: Negative.   Skin: Negative.        Left shoulder lesion bleeds intermittently, but his other lesions remains stable.  Neurological: Negative.   Hematological: Negative.   Psychiatric/Behavioral: Negative.   All other systems reviewed and are negative.    VITALS:  Blood pressure 134/80, pulse 89, temperature 98.2 F (36.8 C), temperature source Oral, resp. rate 18, height 5' 3" (1.6 m), weight 162 lb 8 oz (73.7 kg), SpO2 96 %.  Wt Readings from Last 3 Encounters:  06/07/20 162 lb 8 oz (73.7 kg)  03/30/20 164 lb 7 oz (74.6 kg)  05/16/20 166 lb (75.3 kg)    Body mass index is 28.79 kg/m.  Performance status (ECOG): 1 - Symptomatic but completely ambulatory  PHYSICAL EXAM:  Physical Exam Constitutional:      General: He is not in acute distress.    Appearance: Normal appearance. He is normal weight.  HENT:     Head: Normocephalic and  atraumatic.  Eyes:     General: No scleral icterus.    Extraocular Movements: Extraocular movements intact.     Conjunctiva/sclera: Conjunctivae normal.     Pupils: Pupils are equal, round, and reactive to light.  Cardiovascular:     Rate and Rhythm: Normal rate and regular rhythm.     Pulses: Normal pulses.     Heart sounds: Normal heart sounds. No murmur heard. No friction rub. No gallop.   Pulmonary:     Effort: Pulmonary effort is normal. No respiratory distress.     Breath sounds: Normal breath sounds.  Abdominal:     General: Bowel sounds are normal. There is no distension.     Palpations: Abdomen is soft. There is no hepatomegaly, splenomegaly or mass.     Tenderness: There is no abdominal tenderness.  Musculoskeletal:        General: Normal range of motion.     Cervical back: Normal range of motion and neck supple.     Right lower leg: No edema.     Left lower leg: No edema.  Lymphadenopathy:  Cervical: No cervical adenopathy.     Comments: He has a 3 cm left axillary node.  Firmness in the left pectoralis muscle with some tenderness and a tiny nodule about 1 cm.  Skin:    General: Skin is warm and dry.  Neurological:     General: No focal deficit present.     Mental Status: He is alert and oriented to person, place, and time. Mental status is at baseline.  Psychiatric:        Mood and Affect: Mood normal.        Behavior: Behavior normal.        Thought Content: Thought content normal.        Judgment: Judgment normal.     LABS:   CBC Latest Ref Rng & Units 05/10/2020 04/14/2020 03/30/2020  WBC - 9.7 10.0 12.6  Hemoglobin 13.5 - 17.5 16.0 15.2 16.3  Hematocrit 41 - 53 47 46.4 50  Platelets 150 - 399 327 363 334   CMP Latest Ref Rng & Units 05/10/2020 04/14/2020 03/30/2020  Glucose 70 - 99 mg/dL - 117(H) -  BUN 4 - 21 7 9 10  Creatinine 0.6 - 1.3 1.0 1.07 0.9  Sodium 137 - 147 141 138 140  Potassium 3.4 - 5.3 3.6 2.9(L) 4.1  Chloride 99 - 108 108 104  110(A)  CO2 13 - 22 24(A) 22 19  Calcium 8.7 - 10.7 9.0 9.3 9.1  Total Protein 6.5 - 8.1 g/dL - 7.6 -  Total Bilirubin 0.3 - 1.2 mg/dL - 0.7 -  Alkaline Phos 25 - 125 132(A) 110 129(A)  AST 14 - 40 29 23 34  ALT 10 - 40 26 21 32     STUDIES:   He underwent a CT chest with contrast on 05/29/2020 showing: 1. Interval growth of bulky heterogeneously enhancing left axillary and left retropectoral lymphadenopathy. New left hilar lymphadenopathy. Findings are compatible with progression of metastatic nodal disease. 2. New subcentimeter pulmonary nodules in the lungs bilaterally, indeterminate for pulmonary metastases. Recommend attention on follow-up chest CT in 3 months. 3. New small subcutaneous soft tissue nodule in the ventral upper left chest wall, suspicious for subcutaneous metastasis. 4. Indistinct clustered subcapsular foci of hyperenhancement in the peripheral inferior right liver lobe, not convincingly changed, indeterminate. MRI abdomen without and with IV contrast could be obtained for further characterization as clinically warranted.    HISTORY:   Allergies: No Known Allergies  Current Medications: Current Outpatient Medications  Medication Sig Dispense Refill  . benzonatate (TESSALON) 100 MG capsule Take 100 mg by mouth 3 (three) times daily as needed.    . digoxin (LANOXIN) 0.125 MG tablet Take 1 tablet (125 mcg total) by mouth daily. 30 tablet 5  . diltiazem (CARDIZEM) 30 MG tablet Take 1 tablet (30 mg total) by mouth 2 (two) times daily. 180 tablet 3  . ELIQUIS 2.5 MG TABS tablet Take 1 tablet (2.5 mg total) by mouth 2 (two) times daily. 60 tablet 2  . empagliflozin (JARDIANCE) 10 MG TABS tablet Take 1 tablet (10 mg total) by mouth daily before breakfast. 30 tablet 6  . ENTRESTO 24-26 MG Take 1 tablet by mouth 2 (two) times daily.    . furosemide (LASIX) 20 MG tablet Take 1 tablet (20 mg total) by mouth daily. 90 tablet 3  . HYDROcodone-acetaminophen (NORCO/VICODIN)  5-325 MG tablet Take 1 tablet by mouth every 4 (four) hours as needed. for pain    . metoprolol tartrate (LOPRESSOR) 50 MG tablet   Take 1 tablet (50 mg total) by mouth 2 (two) times daily. 60 tablet 1  . ondansetron (ZOFRAN) 4 MG tablet Take 4 mg by mouth every 8 (eight) hours as needed for nausea or vomiting.    . oxyCODONE (OXY IR/ROXICODONE) 5 MG immediate release tablet Take 5 mg by mouth every 8 (eight) hours as needed.    . potassium chloride SA (KLOR-CON) 20 MEQ tablet Take 1 tablet (20 mEq total) by mouth daily. 90 tablet 3  . prochlorperazine (COMPAZINE) 10 MG tablet Take 10 mg by mouth every 6 (six) hours as needed for nausea or vomiting.     No current facility-administered medications for this visit.     ASSESSMENT & PLAN:   Assessment/Plan: 1. History of melanoma of the right upper abdominal wall in 2011, stage IA. This was treated with resection/wide excision with Dr. Kimmel.  This area remains without evidence of recurrence.    2.  New enhancement of the bilateral temporal lobes as seen on recent MRI imaging.  The left measures 1.9 x 1.5 x 2.4 cm, and the right measures 1.0 x 1.5 x 0.5 cm.  There is also associated parenchymal edema and possible abnormal leptomeningeal enhancement.  We feel this is metastatic melanoma but he is essentially asymptomatic.    3.  Enlarged left axillary and subpectoral metastatic lymph nodes measuring up to 2.7 cm.  4.  Innumerable dark and pedunculated moles all over the body, which have worsened over the past few months, and clearly appear consistent with metastatic melanoma.  The largest measures about 3 cm of the middle back.  He has undergone biopsy and excision of two of these.  He continues immunotherapy with maintenance nivolumab and is tolerating it well.  There has been a slight increase in the lesion of his left shoulder and right lower extremity, which I feel is treatment effect.  We will continue to monitor this.  5.  Multiple tiny  bilateral pulmonary nodules, nonspecific.  These are likely benign, but could represent early metastatic disease.  We will continue to monitor.    6.  Positive BRAF mutation.  7. Atrial fibrillation.  He is on medications and low dose anticoagulation and follows with Dr. Munley.    8. Cardiomyopathy.  He has been started on Entresto.    9. Right lower lobe pneumonia, resolved.   10.  Mediastinal and left axillary adenopathy with pulmonary nodules which could represent a sarcoidosis-like syndrome, but I cannot rule out progressive melanoma.  Plan: CT imaging shows progression of left axillary and left retropectoral lymphadenopathy, as well as new left hilar lymphadenopathy.  There is also new subcentimeter pulmonary nodules in the lungs bilaterally, indeterminate for pulmonary metastases, and new small subcutaneous soft tissue nodule in the ventral upper left chest wall, suspicious for subcutaneous metastasis.  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  We will be better able to determine this with his next imaging.  If this is determined to be progressive disease, we could switch him to oral therapy as he is positive for the BRAF mutation.  We will need to reschedule his brain MRI that was scheduled yesterday.  As the left shoulder lesion is quite large, pedunculated, and bleeds intermittently, we will contact Dr. Morgan for resection.  He will proceed with his 2nd cycle of maintenance nivolumab on January 7th.  We will then see him in 4 weeks with CBC and CMP   prior to his next cycle.  In 3 months we will plan to repeat CT imaging.  The patient and his wife understand the plans discussed today and are in agreement with them.  They know to contact our office if his develops concerns prior to his next appointment.   I provided 25 minutes of face-to-face time during this this encounter and > 50% was spent counseling  as documented under my assessment and plan.     H , MD [facility] [address] [place] [address] Dept: [phone] Dept Fax: [phone]     I, Lauren Curry, am acting as scribe for  H. , MD  I have reviewed this report as typed by the medical scribe, and it is complete and accurate.      

## 2020-06-07 ENCOUNTER — Other Ambulatory Visit: Payer: Self-pay | Admitting: Hematology and Oncology

## 2020-06-07 ENCOUNTER — Telehealth: Payer: Self-pay | Admitting: Oncology

## 2020-06-07 ENCOUNTER — Other Ambulatory Visit: Payer: Self-pay | Admitting: Oncology

## 2020-06-07 ENCOUNTER — Inpatient Hospital Stay: Payer: 59

## 2020-06-07 ENCOUNTER — Telehealth: Payer: Self-pay

## 2020-06-07 ENCOUNTER — Other Ambulatory Visit: Payer: Self-pay

## 2020-06-07 ENCOUNTER — Inpatient Hospital Stay: Payer: 59 | Attending: Oncology | Admitting: Oncology

## 2020-06-07 ENCOUNTER — Encounter: Payer: Self-pay | Admitting: Oncology

## 2020-06-07 VITALS — BP 134/80 | HR 89 | Temp 98.2°F | Resp 18 | Ht 63.0 in | Wt 162.5 lb

## 2020-06-07 DIAGNOSIS — I509 Heart failure, unspecified: Secondary | ICD-10-CM | POA: Diagnosis not present

## 2020-06-07 DIAGNOSIS — R110 Nausea: Secondary | ICD-10-CM | POA: Insufficient documentation

## 2020-06-07 DIAGNOSIS — Z7901 Long term (current) use of anticoagulants: Secondary | ICD-10-CM | POA: Diagnosis not present

## 2020-06-07 DIAGNOSIS — C4359 Malignant melanoma of other part of trunk: Secondary | ICD-10-CM | POA: Diagnosis not present

## 2020-06-07 DIAGNOSIS — R590 Localized enlarged lymph nodes: Secondary | ICD-10-CM | POA: Diagnosis not present

## 2020-06-07 DIAGNOSIS — Z5112 Encounter for antineoplastic immunotherapy: Secondary | ICD-10-CM | POA: Insufficient documentation

## 2020-06-07 DIAGNOSIS — Z23 Encounter for immunization: Secondary | ICD-10-CM | POA: Insufficient documentation

## 2020-06-07 DIAGNOSIS — Z79899 Other long term (current) drug therapy: Secondary | ICD-10-CM | POA: Diagnosis not present

## 2020-06-07 DIAGNOSIS — C7931 Secondary malignant neoplasm of brain: Secondary | ICD-10-CM | POA: Diagnosis not present

## 2020-06-07 DIAGNOSIS — R918 Other nonspecific abnormal finding of lung field: Secondary | ICD-10-CM | POA: Insufficient documentation

## 2020-06-07 DIAGNOSIS — I4891 Unspecified atrial fibrillation: Secondary | ICD-10-CM | POA: Diagnosis not present

## 2020-06-07 DIAGNOSIS — C439 Malignant melanoma of skin, unspecified: Secondary | ICD-10-CM | POA: Diagnosis not present

## 2020-06-07 LAB — HEPATIC FUNCTION PANEL
ALT: 33 (ref 10–40)
AST: 38 (ref 14–40)
Alkaline Phosphatase: 147 — AB (ref 25–125)
Bilirubin, Total: 0.5

## 2020-06-07 LAB — CBC AND DIFFERENTIAL
HCT: 48 (ref 41–53)
Hemoglobin: 16.1 (ref 13.5–17.5)
Neutrophils Absolute: 7.97
Platelets: 367 (ref 150–399)
WBC: 13.5

## 2020-06-07 LAB — BASIC METABOLIC PANEL
BUN: 8 (ref 4–21)
CO2: 23 — AB (ref 13–22)
Chloride: 107 (ref 99–108)
Creatinine: 0.8 (ref 0.6–1.3)
Glucose: 123
Potassium: 3.8 (ref 3.4–5.3)
Sodium: 139 (ref 137–147)

## 2020-06-07 LAB — CBC: RBC: 5.33 — AB (ref 3.87–5.11)

## 2020-06-07 LAB — COMPREHENSIVE METABOLIC PANEL
Albumin: 4.3 (ref 3.5–5.0)
Calcium: 9.4 (ref 8.7–10.7)

## 2020-06-07 LAB — TSH: TSH: 0.783 u[IU]/mL (ref 0.350–4.500)

## 2020-06-07 NOTE — Telephone Encounter (Signed)
-----   Message from Dellia Beckwith, MD sent at 06/07/2020  2:20 PM EST ----- Regarding: refer Pls refer Dr. Lequita Halt for excision of left shoulder lesion

## 2020-06-07 NOTE — Telephone Encounter (Signed)
Appointment with Dr. Lequita Halt is scheduled for Tuesday 06/13/20 at 0900. Wife notified of the appointment.

## 2020-06-07 NOTE — Telephone Encounter (Signed)
Per 1/5 los next appt given to patient 

## 2020-06-08 LAB — T4: T4, Total: 7.5 ug/dL (ref 4.5–12.0)

## 2020-06-09 ENCOUNTER — Inpatient Hospital Stay: Payer: 59

## 2020-06-09 ENCOUNTER — Other Ambulatory Visit: Payer: Self-pay

## 2020-06-09 ENCOUNTER — Ambulatory Visit: Payer: 59

## 2020-06-09 VITALS — BP 147/82 | HR 77 | Temp 97.9°F | Resp 18 | Ht 63.0 in | Wt 164.8 lb

## 2020-06-09 DIAGNOSIS — C4359 Malignant melanoma of other part of trunk: Secondary | ICD-10-CM

## 2020-06-09 DIAGNOSIS — Z5112 Encounter for antineoplastic immunotherapy: Secondary | ICD-10-CM | POA: Diagnosis not present

## 2020-06-09 MED ORDER — SODIUM CHLORIDE 0.9 % IV SOLN
Freq: Once | INTRAVENOUS | Status: DC
  Filled 2020-06-09: qty 250

## 2020-06-09 MED ORDER — SODIUM CHLORIDE 0.9 % IV SOLN
480.0000 mg | Freq: Once | INTRAVENOUS | Status: AC
  Administered 2020-06-09: 480 mg via INTRAVENOUS
  Filled 2020-06-09: qty 48

## 2020-06-09 MED ORDER — HEPARIN SOD (PORK) LOCK FLUSH 100 UNIT/ML IV SOLN
500.0000 [IU] | Freq: Once | INTRAVENOUS | Status: AC | PRN
  Administered 2020-06-09: 500 [IU]
  Filled 2020-06-09: qty 5

## 2020-06-09 NOTE — Patient Instructions (Signed)
[facility] [place] Discharge Instructions for Patients Receiving Chemotherapy  Today you received the following chemotherapy agents Nivolumab  To help prevent nausea and vomiting after your treatment, we encourage you to take your nausea medication as directed.   If you develop nausea and vomiting that is not controlled by your nausea medication, call the clinic.   BELOW ARE SYMPTOMS THAT SHOULD BE REPORTED IMMEDIATELY:  *FEVER GREATER THAN 100.5 F  *CHILLS WITH OR WITHOUT FEVER  NAUSEA AND VOMITING THAT IS NOT CONTROLLED WITH YOUR NAUSEA MEDICATION  *UNUSUAL SHORTNESS OF BREATH  *UNUSUAL BRUISING OR BLEEDING  TENDERNESS IN MOUTH AND THROAT WITH OR WITHOUT PRESENCE OF ULCERS  *URINARY PROBLEMS  *BOWEL PROBLEMS  UNUSUAL RASH Items with * indicate a potential emergency and should be followed up as soon as possible.  Feel free to call the clinic should you have any questions or concerns at The clinic phone number is [phone].  Please show the CHEMO ALERT CARD at check-in to the Emergency Department and triage nurse.   

## 2020-06-09 NOTE — Progress Notes (Signed)
1010: PT STABLE AT TIME OF DISCHARGE  

## 2020-06-10 ENCOUNTER — Other Ambulatory Visit: Payer: Self-pay | Admitting: Oncology

## 2020-06-10 DIAGNOSIS — L049 Acute lymphadenitis, unspecified: Secondary | ICD-10-CM

## 2020-06-10 MED ORDER — CEPHALEXIN 500 MG PO CAPS: 500.0000 mg | ORAL_CAPSULE | Freq: Three times a day (TID) | ORAL | 0 refills | Status: AC

## 2020-06-11 ENCOUNTER — Other Ambulatory Visit: Payer: Self-pay | Admitting: Oncology

## 2020-06-11 DIAGNOSIS — C4359 Malignant melanoma of other part of trunk: Secondary | ICD-10-CM

## 2020-06-12 ENCOUNTER — Encounter: Payer: Self-pay | Admitting: Oncology

## 2020-06-12 ENCOUNTER — Inpatient Hospital Stay: Payer: 59

## 2020-06-12 ENCOUNTER — Other Ambulatory Visit: Payer: Self-pay

## 2020-06-12 ENCOUNTER — Inpatient Hospital Stay (INDEPENDENT_AMBULATORY_CARE_PROVIDER_SITE_OTHER): Payer: 59 | Admitting: Oncology

## 2020-06-12 VITALS — BP 128/85 | HR 88 | Temp 98.3°F | Resp 18 | Ht 63.0 in | Wt 163.2 lb

## 2020-06-12 DIAGNOSIS — C4359 Malignant melanoma of other part of trunk: Secondary | ICD-10-CM

## 2020-06-12 NOTE — Progress Notes (Signed)
[place]  [address] [place],  [place]  [address] [phone]  Clinic Day:  06/12/2020  Referring physician: Derwood Kaplan, MD   CHIEF COMPLAINT:  CC: Left axillary pain and increasing melanoma skin lesions.  Current Treatment:  Palliative immunotherapy with ipilimumab/nivolumab.   HISTORY OF PRESENT ILLNESS:  Adam Bender. is a 60 y.o. 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 Michele Mcalpine.  He had several areas of suspicious skin lesions develop, which seem to have worsened.  He had not been following with dermatology routinely.  He presented to the [facility] Emergency Department on August 4th due to a 2 month history of headaches.  These occur 3-4 times per week, usually of the right temporal area, but occasionally the left temporal as well.  The headaches are associated with vertigo upon standing or when looking down, nausea and fatigue.  He denied any visual acuity changes, numbness or tingling.  CT head imaging revealed subcortical white matter edema involving the left temporal lobe concerning for infarction or underlying neoplasm.  Correlating MRI head confirmed homogeneous enhancement at the periphery of the anterior left temporal lobe measuring 1.9 x 1.5 x 2.4 cm.  There is also adjacent parenchymal edema.  Additional periphery of the inferior right temporal lobe measures 1.0 x 1.5 x 0.5 cm also with adjacent parenchymal edema.  A 4 mm focus of enhancement is present at the periphery of the right temporal lobe.  He had numerous black skin lesions scattered over his body which are enlarging.  CT chest, abdomen and pelvis from August 11th revealed enlarged left axillary and subpectoral lymph nodes measuring up to 2.0 x 1.9 cm.  Multiple tiny bilateral pulmonary nodules, nonspecific, were also  observed, the largest measuring 3 mm.  There was no evidence of metastatic disease in the abdomen or pelvis.  He was seen by Dr. Lilia Pro and underwent biopsy and excision of his back and lip lesions on August 10th.  The pathology from these procedures revealed  His melanoma is positive for BRAF.   He received his first dose of immunotherapy with ipilimumab/nivolumab IV every 3 weeks on September 17th and tolerated this well.  However, he presented to the emergency department later that day due to persistent atrial fibrillation and high fever.  He was placed on medications and started on low-dose anticoagulation.  He was admitted with right lower lobe pneumonia and treated accordingly with cefepime and doxycycline.  He was discharged on September 23rd on Cardizem 30 mg TID, Entresto 24/26 BID, digoxin 0.125 mg daily, metoprolol 50 mg BID, Eliquis 2.5 mg BID, doxycycline 100 mg BID #10, and Farxiga 10 mg daily.  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  Some of the lesions are decreasing and scabbing over.  CT chest from December 27th revealed interval growth of bulky heterogeneously enhancing left axillary and left retropectoral lymphadenopathy, as well as new left hilar lymphadenopathy.  There is also new subcentimeter pulmonary nodules in the lungs bilaterally, indeterminate for pulmonary metastases, and new small subcutaneous soft tissue nodule in the ventral upper left chest wall, suspicious for subcutaneous metastasis.  Indistinct clustered subcapsular foci of hyperenhancement in the peripheral inferior right liver lobe, not convincingly changed, indeterminate, was also seen.  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  I cannot rule out progression of his melanoma, but time will tell.  We will be better able to determine this with his next  imaging.    INTERVAL HISTORY:  He has been added onto the schedule today after he called over the weekend due to pain and worsening swelling of the left axillary lymph nodes.  His white count was elevated at his visit last week at 13.4, so I placed him on a course of Keflex.   He now states that the lymph nodes are smaller and less painful.  He is scheduled for surgical resection of the left shoulder lesion tomorrow.  We may request Guardant testing of this tissue.  His  appetite is good, and he has gained 1 and 1/2  pounds since his last visit.  He denies fever, chills or other signs of infection.  He denies nausea, vomiting, bowel issues, or abdominal pain.  He denies sore throat, cough, dyspnea, or chest pain.  REVIEW OF SYSTEMS:  Review of Systems  Constitutional: Negative.   HENT:  Negative.   Eyes: Negative.   Respiratory: Negative.   Cardiovascular: Negative.   Gastrointestinal: Negative.   Endocrine: Negative.   Genitourinary: Negative.    Musculoskeletal:       Mildly painful and swollen left axillary nodes  Skin:       Several scattered skin lesions across the body, the worst of the left shoulder.  Neurological: Negative.   Hematological: Negative.   Psychiatric/Behavioral: Negative.   All other systems reviewed and are negative.    VITALS:  Blood pressure 128/85, pulse 88, temperature 98.3 F (36.8 C), temperature source Oral, resp. rate 18, height _0  (1.6 m), weight 163 lb 3.2 oz (74 kg), SpO2 96 %.  Wt Readings from Last 3 Encounters:  06/12/20 163 lb 3.2 oz (74 kg)  06/09/20 164 lb 12 oz (74.7 kg)  06/07/20 162 lb 8 oz (73.7 kg)    Body mass index is 28.91 kg/m.  Performance status (ECOG): 1 - Symptomatic but completely ambulatory  PHYSICAL EXAM:  Physical Exam Constitutional:      General: He is not in acute distress.    Appearance: Normal appearance. He is normal weight.  HENT:     Head: Normocephalic and atraumatic.  Eyes:     General: No scleral  icterus.    Extraocular Movements: Extraocular movements intact.     Conjunctiva/sclera: Conjunctivae normal.     Pupils: Pupils are equal, round, and reactive to light.  Cardiovascular:     Rate and Rhythm: Normal rate and regular rhythm.     Pulses: Normal pulses.     Heart sounds: Normal heart sounds. No murmur heard. No friction rub. No gallop.   Pulmonary:     Effort: Pulmonary effort is normal. No respiratory distress.     Breath sounds: Normal breath sounds.  Abdominal:     General: Bowel sounds are normal. There is no distension.     Palpations: Abdomen is soft. There is no mass.     Tenderness: There is no abdominal tenderness.  Musculoskeletal:        General: Normal range of motion.     Cervical back: Normal range of motion and neck supple.     Right lower leg: No edema.     Left lower leg: No edema.  Lymphadenopathy:     Cervical: No cervical adenopathy.  Skin:    General: Skin is warm and dry.     Comments: He still has several scattered lesions of the right upper back, left shoulder, and right lower back with one becoming more prominent in the central right back.  The left shoulder lesion looks worse with some bleeding.  He has two linear areas that just has subcutaneous induration but no skin lesion in the left flank area. There is a 1 cm nodule in the subcutaneous tissue of the left upper anterior chest.  He has the usual lesions, less than 1 cm of the right forearm and right anterior shouler.  Neurological:     General: No focal deficit present.     Mental Status: He is alert and oriented to person, place, and time. Mental status is at baseline.  Psychiatric:        Mood and Affect: Mood normal.        Behavior: Behavior normal.        Thought Content: Thought content normal.        Judgment: Judgment normal.     LABS:   CBC Latest Ref Rng & Units 06/07/2020 05/10/2020 04/14/2020  WBC - 13.5 9.7 10.0  Hemoglobin 13.5 - 17.5 16.1 16.0 15.2  Hematocrit 41 - 53 48  47 46.4  Platelets 150 - 399 367 327 363   CMP Latest Ref Rng & Units 06/07/2020 05/10/2020 04/14/2020  Glucose 70 - 99 mg/dL - - 117(H)  BUN 4 - _0 Creatinine 0.6 - 1.3 0.8 1.0 1.07  Sodium 137 - 147 139 141 138  Potassium 3.4 - 5.3 3.8 3.6 2.9(L)  Chloride 99 - 108 107 108 104  CO2 13 - 22 23(A) 24(A) 22  Calcium 8.7 - 10.7 9.4 9.0 9.3  Total Protein 6.5 - 8.1 g/dL - - 7.6  Total Bilirubin 0.3 - 1.2 mg/dL - - 0.7  Alkaline Phos 25 - 125 147(A) 132(A) 110  AST 14 - 40 38 29 23  ALT 10 - 40 33 26 21     STUDIES:   No current studies   HISTORY:   Allergies: No Known Allergies  Current Medications: Current Outpatient Medications  Medication Sig Dispense Refill  . benzonatate (TESSALON) 100 MG capsule Take 100 mg by mouth 3 (three) times daily as needed.    . cephALEXin (KEFLEX) 500 MG capsule Take 1 capsule (500 mg total) by mouth 3 (three) times daily for 10 days. 30 capsule 0  . digoxin (LANOXIN) 0.125 MG tablet Take 1 tablet (125 mcg total) by mouth daily. 30 tablet 5  . diltiazem (CARDIZEM) 30 MG tablet Take 1 tablet (30 mg total) by mouth 2 (two) times daily. 180 tablet 3  . ELIQUIS 2.5 MG TABS tablet Take 1 tablet (2.5 mg total) by mouth 2 (two) times daily. 60 tablet 2  . empagliflozin (JARDIANCE) 10 MG TABS tablet Take 1 tablet (10 mg total) by mouth daily before breakfast. 30 tablet 6  . ENTRESTO 24-26 MG Take 1 tablet by mouth 2 (two) times daily.    . furosemide (LASIX) 20 MG tablet Take 1 tablet (20 mg total) by mouth daily. 90 tablet 3  . HYDROcodone-acetaminophen (NORCO/VICODIN) 5-325 MG tablet Take 1 tablet by mouth every 4 (four) hours as needed. for pain    . metoprolol tartrate (LOPRESSOR) 50 MG tablet Take 1 tablet (50 mg total) by mouth 2 (two) times daily. 60 tablet 1  . ondansetron (ZOFRAN) 4 MG tablet Take  4 mg by mouth every 8 (eight) hours as needed for nausea or vomiting.    Marland Kitchen oxyCODONE (OXY IR/ROXICODONE) 5 MG immediate release tablet Take 5 mg  by mouth every 8 (eight) hours as needed.    . potassium chloride SA (KLOR-CON) 20 MEQ tablet Take 1 tablet (20 mEq total) by mouth daily. 90 tablet 3  . prochlorperazine (COMPAZINE) 10 MG tablet Take 10 mg by mouth every 6 (six) hours as needed for nausea or vomiting.     No current facility-administered medications for this visit.     ASSESSMENT & PLAN:   Assessment/Plan: 1. History of melanoma of the right upper abdominal wall in 2011, stage IA. This was treated with resection/wide excision with Dr. Michele Mcalpine.  This area remains without evidence of recurrence.    2.  New enhancement of the bilateral temporal lobes as seen on recent MRI imaging.  The left measures 1.9 x 1.5 x 2.4 cm, and the right measures 1.0 x 1.5 x 0.5 cm.  There is also associated parenchymal edema and possible abnormal leptomeningeal enhancement.  We feel this is metastatic melanoma but he is essentially asymptomatic.    3.  Enlarged left axillary and subpectoral metastatic lymph nodes measuring up to 2.7 cm.  These became more inflamed with a leukocytosis, so I treated him with Keflex and he is already improving and his leukocytosis is almost resolved.  4.  Innumerable dark and pedunculated moles all over the body, which have worsened over the past few months, and clearly appear consistent with metastatic melanoma.  The largest measures about 3 cm of the middle back.  He has undergone biopsy and excision of two of these.  He continues immunotherapy with maintenance nivolumab and is tolerating it well.  There has been a slight increase in the lesion of his left shoulder and right lower extremity, which I feel is treatment effect.  We will continue to monitor this.  5.  Multiple tiny bilateral pulmonary nodules, nonspecific.  These are likely benign, but could represent early metastatic disease.  We will continue to monitor.    6.  Positive BRAF mutation.  7. Atrial fibrillation.  He is on medications and low dose  anticoagulation and follows with Dr. Bettina Gavia.    8. Cardiomyopathy.  He has been started on Entresto.    9. Right lower lobe pneumonia, resolved.   10.  Mediastinal and left axillary adenopathy with pulmonary nodules which could represent a sarcoidosis-like syndrome, but I cannot rule out progressive melanoma.  Plan: The patient called over the weekend due to worsening enlarged and painful left axillary nodes.  His white count was elevated at his last visit and so I placed him on a course of Keflex.  He has already had some improvement with decrease in the size of the lymph nodes and these are now less painful.  He is scheduled for surgical resection of the left shoulder lesion with Dr. Lilia Pro tomorrow.  The MRI brain still needs to be rescheduled.  We have him keep his appointment in February as scheduled.  The patient and his wife understand the plans discussed today and are in agreement with them.  They know to contact our office if his develops concerns prior to his next appointment.   I provided 15 minutes of face-to-face time during this this encounter and > 50% was spent counseling as documented under my assessment and plan.    Derwood Kaplan, MD [place]  [place] [id number] Dept: [phone] Dept Fax: [phone]     I, Rita Ohara, am acting as scribe for Derwood Kaplan, MD  I have reviewed this report as typed by the medical scribe, and it is complete and accurate.

## 2020-06-13 ENCOUNTER — Telehealth: Payer: Self-pay | Admitting: Oncology

## 2020-06-13 ENCOUNTER — Encounter: Payer: Self-pay | Admitting: Oncology

## 2020-06-13 ENCOUNTER — Other Ambulatory Visit: Payer: Self-pay | Admitting: Hematology and Oncology

## 2020-06-13 DIAGNOSIS — R059 Cough, unspecified: Secondary | ICD-10-CM

## 2020-06-13 NOTE — Telephone Encounter (Signed)
06/13/20 Spoke with wife and rs appts

## 2020-06-15 ENCOUNTER — Other Ambulatory Visit: Payer: Self-pay | Admitting: Hematology and Oncology

## 2020-06-15 DIAGNOSIS — C4359 Malignant melanoma of other part of trunk: Secondary | ICD-10-CM

## 2020-06-16 ENCOUNTER — Inpatient Hospital Stay: Payer: 59

## 2020-06-16 ENCOUNTER — Ambulatory Visit (HOSPITAL_BASED_OUTPATIENT_CLINIC_OR_DEPARTMENT_OTHER)
Admission: RE | Admit: 2020-06-16 | Discharge: 2020-06-16 | Disposition: A | Discharge status: Home / Self Care | Payer: 59 | Source: Ambulatory Visit | Attending: Cardiology | Admitting: Cardiology

## 2020-06-16 ENCOUNTER — Other Ambulatory Visit: Payer: Self-pay

## 2020-06-16 ENCOUNTER — Encounter: Payer: Self-pay | Admitting: Oncology

## 2020-06-16 ENCOUNTER — Inpatient Hospital Stay (INDEPENDENT_AMBULATORY_CARE_PROVIDER_SITE_OTHER): Payer: 59 | Admitting: Oncology

## 2020-06-16 VITALS — BP 142/92 | HR 84 | Temp 98.0°F | Resp 18 | Ht 63.0 in | Wt 163.6 lb

## 2020-06-16 DIAGNOSIS — I4819 Other persistent atrial fibrillation: Secondary | ICD-10-CM | POA: Diagnosis not present

## 2020-06-16 DIAGNOSIS — Z7901 Long term (current) use of anticoagulants: Secondary | ICD-10-CM | POA: Insufficient documentation

## 2020-06-16 DIAGNOSIS — I5022 Chronic systolic (congestive) heart failure: Secondary | ICD-10-CM | POA: Diagnosis not present

## 2020-06-16 DIAGNOSIS — C7931 Secondary malignant neoplasm of brain: Secondary | ICD-10-CM | POA: Diagnosis not present

## 2020-06-16 DIAGNOSIS — Z79899 Other long term (current) drug therapy: Secondary | ICD-10-CM | POA: Insufficient documentation

## 2020-06-16 NOTE — Progress Notes (Signed)
[facility]  [address] [place],  [place]  [address] [phone]  Clinic Day:  06/16/2020  Referring physician: Derwood Kaplan, MD   CHIEF COMPLAINT:  CC: Left axillary pain and increasing melanoma skin lesions.  Current Treatment:  Will plan to switch him to BRAF targeted oral therapy and radiation of the brain   HISTORY OF PRESENT ILLNESS:  Adam Bender. is a 60 y.o. 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 Michele Mcalpine.  He had several areas of suspicious skin lesions develop, which seem to have worsened.  He had not been following with dermatology routinely.  He presented to the [facility] on August 4th due to a 2 month history of headaches.  These occur 3-4 times per week, usually of the right temporal area, but occasionally the left temporal as well.  The headaches are associated with vertigo upon standing or when looking down, nausea and fatigue.  He denied any visual acuity changes, numbness or tingling.  CT head imaging revealed subcortical white matter edema involving the left temporal lobe concerning for infarction or underlying neoplasm.  Correlating MRI head confirmed homogeneous enhancement at the periphery of the anterior left temporal lobe measuring 1.9 x 1.5 x 2.4 cm.  There is also adjacent parenchymal edema.  Additional periphery of the inferior right temporal lobe measures 1.0 x 1.5 x 0.5 cm also with adjacent parenchymal edema.  A 4 mm focus of enhancement is present at the periphery of the right temporal lobe.  He had numerous black skin lesions scattered over his body which are enlarging.  CT chest, abdomen and pelvis from August 11th revealed enlarged left axillary and subpectoral lymph nodes measuring up to 2.0 x 1.9 cm.  Multiple tiny bilateral pulmonary  nodules, nonspecific, were also observed, the largest measuring 3 mm.  There was no evidence of metastatic disease in the abdomen or pelvis.  He was seen by Dr. Lilia Pro and underwent biopsy and excision of his back and lip lesions on August 10th.  The pathology from these procedures revealed malignant melanoma, Clark level IV, with peripheral and deep margins involved.  He is positive for BRAF.   He received his first dose of immunotherapy with ipilimumab/nivolumab IV every 3 weeks on September 17th and tolerated this well.  However, he presented to the emergency department later that day due to persistent atrial fibrillation and high fever.  He was placed on medications and started on low-dose anticoagulation.  He was admitted with right lower lobe pneumonia and treated accordingly with cefepime and doxycycline.  He was discharged on September 23rd on Cardizem 30 mg TID, Entresto 24/26 BID, digoxin 0.125 mg daily, metoprolol 50 mg BID, Eliquis 2.5 mg BID, doxycycline 100 mg BID #10, and Farxiga 10 mg daily.  He has had some increase in the skin lesions while on immunotherapy, which we feel was possibly pseudoprogression.  Some of the lesions are decreasing and scabbing over.  CT chest from December 27th revealed interval growth of bulky heterogeneously enhancing left axillary and left retropectoral lymphadenopathy, as well as new left hilar lymphadenopathy.  There is also new subcentimeter pulmonary nodules in the lungs bilaterally, indeterminate for pulmonary metastases, and new small subcutaneous soft tissue nodule in the ventral upper left chest wall, suspicious for subcutaneous metastasis.  Indistinct clustered subcapsular foci of hyperenhancement  in the peripheral inferior right liver lobe, not convincingly changed, indeterminate, was also seen.  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  I cannot rule out  progression of his melanoma, but time will tell.  He was seen on January 10th for pain and swelling of the left axillary nodes.  Since he had leukocytosis and tenderness, he was placed on Keflex.  When I saw him for follow up, he was already improving.    INTERVAL HISTORY:  He has been added onto the schedule today to review recent imaging results.  He underwent an MRI head on January 13th revealed mixed treatment response with slight enlargement of the right inferior temporal lobe lesion now measuring 17 x 11 mm, and new right frontal operculum lesion measuring 2 mm and left frontal leptomeningeal enhancement.  There is interval resolution of the leptomeningeal enhancement the left postcentral sulcus.  We will refer him to Dr. Orlene Erm for radiation consultation.  He underwent surgical resection of the left shoulder lesion this morning.  He also underwent an ECHO today.  He denies headaches or vomiting.  He does however note decreased appetite and occasional nausea.  His  appetite is fair, and his weight is stable since his last visit.  He denies fever, chills or other signs of infection.  He denies vomiting, bowel issues, or abdominal pain.  He denies sore throat, cough, dyspnea, or chest pain.  REVIEW OF SYSTEMS:  Review of Systems  Constitutional: Positive for appetite change (mildly decreased).  HENT:  Negative.   Eyes: Negative.   Respiratory: Negative.   Cardiovascular: Negative.   Gastrointestinal: Positive for nausea. Negative for vomiting.  Endocrine: Negative.   Genitourinary: Negative.    Musculoskeletal: Negative.   Skin:       Multiple skin lesions. The left shoulder lesion, right back lesion and right hip lesion have been resected this morning.  Neurological: Negative.  Negative for headaches.  Hematological: Negative.   Psychiatric/Behavioral: Negative.   All other systems reviewed and are negative.   VITALS:  Blood pressure (!) 142/92, pulse 84, temperature 98 F (36.7 C),  temperature source Oral, resp. rate 18, height '5\' 3"'  (1.6 m), weight 163 lb 9.6 oz (74.2 kg), SpO2 95 %.  Wt Readings from Last 3 Encounters:  06/16/20 163 lb 9.6 oz (74.2 kg)  06/12/20 163 lb 3.2 oz (74 kg)  06/09/20 164 lb 12 oz (74.7 kg)    Body mass index is 28.98 kg/m.  Performance status (ECOG): 1 - Symptomatic but completely ambulatory  PHYSICAL EXAM:  Physical Exam Lymphadenopathy:     Comments: He still has an enlarged lymph node 2-3 cm in the left axilla but softer, and just mildly tender.  Skin:    Comments: Lesion of the right lateral calf is larger at 2-3 cm now and raised.   The rest of the skin lesions are stable and he does have a new hard subcutaneous nodule in the left upper chest that was noted last week.  Exam is otherwise stable.  LABS:   CBC Latest Ref Rng & Units 06/07/2020 05/10/2020 04/14/2020  WBC - 13.5 9.7 10.0  Hemoglobin 13.5 - 17.5 16.1 16.0 15.2  Hematocrit 41 - 53 48 47 46.4  Platelets 150 - 399 367 327 363   CMP Latest Ref Rng & Units 06/07/2020 05/10/2020 04/14/2020  Glucose 70 - 99 mg/dL - - 117(H)  BUN 4 -  '21 8 7 9  ' Creatinine 0.6 - 1.3 0.8 1.0 1.07  Sodium 137 - 147 139 141 138  Potassium 3.4 - 5.3 3.8 3.6 2.9(L)  Chloride 99 - 108 107 108 104  CO2 13 - 22 23(A) 24(A) 22  Calcium 8.7 - 10.7 9.4 9.0 9.3  Total Protein 6.5 - 8.1 g/dL - - 7.6  Total Bilirubin 0.3 - 1.2 mg/dL - - 0.7  Alkaline Phos 25 - 125 147(A) 132(A) 110  AST 14 - 40 38 29 23  ALT 10 - 40 33 26 21     STUDIES:   He underwent an MRI head with contrast on 06/15/2020 showing mixed treatment response with enlarged right inferior temporal lobe lesion and new right frontal operculum lesion and left frontal leptomeningeal enhancement. Interval resolution of leptomeningeal enhancement the left postcentral sulcus.  HISTORY:   Allergies: No Known Allergies  Current Medications: Current Outpatient Medications  Medication Sig Dispense Refill  . benzonatate (TESSALON) 100 MG  capsule Take 1 capsule by mouth three times daily as needed for cough 90 capsule 0  . cephALEXin (KEFLEX) 500 MG capsule Take 1 capsule (500 mg total) by mouth 3 (three) times daily for 10 days. 30 capsule 0  . digoxin (LANOXIN) 0.125 MG tablet Take 1 tablet (125 mcg total) by mouth daily. 30 tablet 5  . diltiazem (CARDIZEM) 30 MG tablet Take 1 tablet (30 mg total) by mouth 2 (two) times daily. 180 tablet 3  . ELIQUIS 2.5 MG TABS tablet Take 1 tablet (2.5 mg total) by mouth 2 (two) times daily. 60 tablet 2  . empagliflozin (JARDIANCE) 10 MG TABS tablet Take 1 tablet (10 mg total) by mouth daily before breakfast. 30 tablet 6  . ENTRESTO 24-26 MG Take 1 tablet by mouth 2 (two) times daily.    . furosemide (LASIX) 20 MG tablet Take 1 tablet (20 mg total) by mouth daily. 90 tablet 3  . HYDROcodone-acetaminophen (NORCO/VICODIN) 5-325 MG tablet Take 1 tablet by mouth every 4 (four) hours as needed. for pain    . metoprolol tartrate (LOPRESSOR) 50 MG tablet Take 1 tablet (50 mg total) by mouth 2 (two) times daily. 60 tablet 1  . ondansetron (ZOFRAN) 4 MG tablet Take 4 mg by mouth every 8 (eight) hours as needed for nausea or vomiting.    Marland Kitchen oxyCODONE (OXY IR/ROXICODONE) 5 MG immediate release tablet Take 5 mg by mouth every 8 (eight) hours as needed.    . potassium chloride SA (KLOR-CON) 20 MEQ tablet Take 1 tablet (20 mEq total) by mouth daily. 90 tablet 3  . prochlorperazine (COMPAZINE) 10 MG tablet Take 10 mg by mouth every 6 (six) hours as needed for nausea or vomiting.     No current facility-administered medications for this visit.     ASSESSMENT & PLAN:   Assessment/Plan: 1. History of melanoma of the right upper abdominal wall in 2011, stage IA. This was treated with resection/wide excision with Dr. Michele Mcalpine.  This area remains without evidence of recurrence.    2.  Enhancement of the bilateral temporal lobes as seen on MRI imaging.  Most recent scan from January revealed enlarged right  inferior temporal lobe lesion now measuring 17 x 11 mm, and new right frontal operculum lesion measuring 2 mm and left frontal leptomeningeal enhancement.  There is interval resolution of the leptomeningeal enhancement the left postcentral sulcus.  We feel this is metastatic melanoma and he has some mild nausea associated with this now.  He  has been off corticosteroids for several months.    3.  Enlarged left axillary and subpectoral metastatic lymph nodes measuring up to 2.7 cm.  These became more inflamed with a leukocytosis, so I treated him with Keflex and he is already improving and his leukocytosis is almost resolved.  4.  Innumerable dark and pedunculated moles all over the body, which have worsened over the past few months, and clearly appear consistent with metastatic melanoma.  The largest measures about 3 cm of the middle back.  He has undergone biopsy and excision of two of these.  He continues immunotherapy with maintenance nivolumab and is tolerating it well.  There has been a slight increase in the lesion of his left shoulder and right lower extremity.  We have been hoping this represented treatment effect and the enlarging lesions are all in areas where there is friction from his clothing.  5.  Multiple tiny bilateral pulmonary nodules, nonspecific.  These are likely benign, but could represent early metastatic disease.  We will continue to monitor.    6.  Positive BRAF mutation.  We will plan to switch him to oral therapy.  7. Atrial fibrillation.  He is on medications and low dose anticoagulation and follows with Dr. Bettina Gavia.    8. Cardiomyopathy.  He has been started on Entresto.    9. Right lower lobe pneumonia, resolved.   10.  Mediastinal and left axillary adenopathy with pulmonary nodules which could represent a sarcoidosis-like syndrome, but I cannot rule out progressive melanoma.  Plan: MRI imaging has revealed a mixed response with mild enlargement of the right inferior  temporal lobe lesion now measuring 17 x 11 mm, and new right frontal operculum lesion measuring 2 mm and left frontal leptomeningeal enhancement.  There is interval resolution of the leptomeningeal enhancement the left postcentral sulcus.  Therefore, we will refer him to Dr. Orlene Erm for radiation consultation.  With these results we will also discontinue his current treatment and switch him to oral therapy as he is positive for the BRAF mutation.  Potential toxicities associated with this therapy were discussed.  We will order Guardant testing of the recently resected lesion as well as Guardant liquid 360.  We have him keep his appointment in February as scheduled, but may bring him in sooner with the results.  The patient and his wife understand the plans discussed today and are in agreement with them.  They know to contact our office if his develops concerns prior to his next appointment.   I provided 15 minutes of face-to-face time during this this encounter and > 50% was spent counseling as documented under my assessment and plan.    Derwood Kaplan, MD [facility] [address] [place] [address] Dept: [phone] Dept Fax: [phone]     I, Rita Ohara, am acting as scribe for Derwood Kaplan, MD  I have reviewed this report as typed by the medical scribe, and it is complete and accurate.

## 2020-06-17 LAB — ECHOCARDIOGRAM COMPLETE
Area-P 1/2: 3.12 cm2
S' Lateral: 3.59 cm

## 2020-06-20 ENCOUNTER — Other Ambulatory Visit: Payer: Self-pay | Admitting: *Deleted

## 2020-06-23 ENCOUNTER — Other Ambulatory Visit: Payer: Self-pay | Admitting: Hematology and Oncology

## 2020-06-23 DIAGNOSIS — I4891 Unspecified atrial fibrillation: Secondary | ICD-10-CM

## 2020-06-23 NOTE — Telephone Encounter (Signed)
Sent!

## 2020-06-26 ENCOUNTER — Other Ambulatory Visit: Payer: Self-pay | Admitting: Hematology and Oncology

## 2020-06-26 ENCOUNTER — Ambulatory Visit: Payer: Self-pay | Admitting: Oncology

## 2020-06-26 ENCOUNTER — Inpatient Hospital Stay: Payer: 59

## 2020-06-26 ENCOUNTER — Telehealth: Payer: Self-pay | Admitting: Oncology

## 2020-06-26 ENCOUNTER — Other Ambulatory Visit: Payer: Self-pay | Admitting: Oncology

## 2020-06-26 ENCOUNTER — Other Ambulatory Visit: Payer: Self-pay | Admitting: Pharmacist

## 2020-06-26 ENCOUNTER — Inpatient Hospital Stay (INDEPENDENT_AMBULATORY_CARE_PROVIDER_SITE_OTHER): Payer: 59 | Admitting: Oncology

## 2020-06-26 ENCOUNTER — Other Ambulatory Visit: Payer: Self-pay

## 2020-06-26 ENCOUNTER — Encounter: Payer: Self-pay | Admitting: Oncology

## 2020-06-26 VITALS — BP 123/78 | HR 76 | Temp 98.2°F | Resp 18 | Ht 63.0 in | Wt 155.5 lb

## 2020-06-26 VITALS — BP 120/80 | HR 84 | Temp 98.2°F | Resp 20 | Ht 63.0 in | Wt 161.1 lb

## 2020-06-26 DIAGNOSIS — R599 Enlarged lymph nodes, unspecified: Secondary | ICD-10-CM

## 2020-06-26 DIAGNOSIS — Z5112 Encounter for antineoplastic immunotherapy: Secondary | ICD-10-CM | POA: Diagnosis not present

## 2020-06-26 DIAGNOSIS — C4359 Malignant melanoma of other part of trunk: Secondary | ICD-10-CM

## 2020-06-26 DIAGNOSIS — I4891 Unspecified atrial fibrillation: Secondary | ICD-10-CM

## 2020-06-26 DIAGNOSIS — C792 Secondary malignant neoplasm of skin: Secondary | ICD-10-CM | POA: Diagnosis not present

## 2020-06-26 LAB — HEPATIC FUNCTION PANEL
ALT: 20 (ref 10–40)
AST: 33 (ref 14–40)
Alkaline Phosphatase: 157 — AB (ref 25–125)
Bilirubin, Total: 0.7

## 2020-06-26 LAB — BASIC METABOLIC PANEL
BUN: 9 (ref 4–21)
CO2: 25 — AB (ref 13–22)
Chloride: 105 (ref 99–108)
Creatinine: 0.9 (ref 0.6–1.3)
Glucose: 131
Potassium: 3.8 (ref 3.4–5.3)
Sodium: 138 (ref 137–147)

## 2020-06-26 LAB — COMPREHENSIVE METABOLIC PANEL
Albumin: 3.9 (ref 3.5–5.0)
Calcium: 9.4 (ref 8.7–10.7)

## 2020-06-26 LAB — CBC AND DIFFERENTIAL
HCT: 49 (ref 41–53)
Hemoglobin: 16.1 (ref 13.5–17.5)
Neutrophils Absolute: 10.08
Platelets: 365 (ref 150–399)
WBC: 15.5

## 2020-06-26 LAB — CBC: RBC: 5.5 — AB (ref 3.87–5.11)

## 2020-06-26 LAB — TSH: TSH: 1.301 u[IU]/mL (ref 0.350–4.500)

## 2020-06-26 MED ORDER — HEPARIN SOD (PORK) LOCK FLUSH 100 UNIT/ML IV SOLN
250.0000 [IU] | Freq: Once | INTRAVENOUS | Status: DC | PRN
  Filled 2020-06-26: qty 5

## 2020-06-26 MED ORDER — SODIUM CHLORIDE 0.9% FLUSH
10.0000 mL | Freq: Once | INTRAVENOUS | Status: DC | PRN
  Filled 2020-06-26: qty 10

## 2020-06-26 MED ORDER — HYDROCODONE-ACETAMINOPHEN 5-325 MG PO TABS: 1.0000 | ORAL_TABLET | ORAL | 0 refills | Status: DC | PRN

## 2020-06-26 MED ORDER — HEPARIN SOD (PORK) LOCK FLUSH 100 UNIT/ML IV SOLN
500.0000 [IU] | Freq: Once | INTRAVENOUS | Status: AC | PRN
  Administered 2020-06-26: 500 [IU]
  Filled 2020-06-26: qty 5

## 2020-06-26 MED ORDER — CEPHALEXIN 500 MG PO CAPS: 500.0000 mg | ORAL_CAPSULE | Freq: Three times a day (TID) | ORAL | 0 refills | Status: DC

## 2020-06-26 MED ORDER — DEXTROSE 5 % IV SOLN
2.0000 g | Freq: Once | INTRAVENOUS | Status: AC
  Administered 2020-06-26: 2 g via INTRAVENOUS
  Filled 2020-06-26: qty 20

## 2020-06-26 MED ORDER — ALTEPLASE 2 MG IJ SOLR
2.0000 mg | Freq: Once | INTRAMUSCULAR | Status: DC | PRN
  Filled 2020-06-26: qty 2

## 2020-06-26 MED ORDER — METOPROLOL TARTRATE 50 MG PO TABS: 50.0000 mg | ORAL_TABLET | Freq: Two times a day (BID) | ORAL | 5 refills | Status: DC

## 2020-06-26 MED ORDER — SODIUM CHLORIDE 0.9 % IV SOLN
Freq: Once | INTRAVENOUS | Status: AC
  Filled 2020-06-26: qty 250

## 2020-06-26 MED ORDER — METOPROLOL TARTRATE 50 MG PO TABS: 50.0000 mg | ORAL_TABLET | Freq: Two times a day (BID) | ORAL | 5 refills | Status: AC

## 2020-06-26 MED ORDER — HYDROCODONE-ACETAMINOPHEN 5-325 MG PO TABS
1.0000 | ORAL_TABLET | ORAL | 0 refills | Status: DC | PRN
Start: 2020-06-26 — End: 2020-06-26

## 2020-06-26 MED ORDER — SODIUM CHLORIDE 0.9% FLUSH
3.0000 mL | Freq: Once | INTRAVENOUS | Status: DC | PRN
  Filled 2020-06-26: qty 10

## 2020-06-26 NOTE — Telephone Encounter (Signed)
06/26/20 Spoke with wife and gave appts for MRI ON 06/27/20@330PM 

## 2020-06-26 NOTE — Progress Notes (Signed)
[facility]  [address] [place],  [place]  [address] [phone]  Clinic Day:  06/26/2020  Referring physician: Derwood Kaplan, MD   CHIEF COMPLAINT:  CC: Left axillary pain and increasing melanoma skin lesions.  Current Treatment:  Will plan to switch him to BRAF targeted oral therapy and radiation of the brain   HISTORY OF PRESENT ILLNESS:  Adam Bender. is a 60 y.o. 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 Michele Mcalpine.  He had several areas of suspicious skin lesions develop, which seem to have worsened.  He had not been following with dermatology routinely.  He presented to the [facility] Emergency Department on August 4th due to a 2 month history of headaches.  These occur 3-4 times per week, usually of the right temporal area, but occasionally the left temporal as well.  The headaches are associated with vertigo upon standing or when looking down, nausea and fatigue.  He denied any visual acuity changes, numbness or tingling.  CT head imaging revealed subcortical white matter edema involving the left temporal lobe concerning for infarction or underlying neoplasm.  Correlating MRI head confirmed homogeneous enhancement at the periphery of the anterior left temporal lobe measuring 1.9 x 1.5 x 2.4 cm.  There is also adjacent parenchymal edema.  Additional periphery of the inferior right temporal lobe measures 1.0 x 1.5 x 0.5 cm also with adjacent parenchymal edema.  A 4 mm focus of enhancement is present at the periphery of the right temporal lobe.  He had numerous black skin lesions scattered over his body which are enlarging.  CT chest, abdomen and pelvis from August 11th revealed enlarged left axillary and subpectoral lymph nodes measuring up to 2.0 x 1.9 cm.  Multiple tiny bilateral pulmonary  nodules, nonspecific, were also observed, the largest measuring 3 mm.  There was no evidence of metastatic disease in the abdomen or pelvis.  He was seen by Dr. Lilia Pro and underwent biopsy and excision of his back and lip lesions on August 10th.  The pathology from these procedures revealed malignant melanoma, Clark level IV, with peripheral and deep margins involved.  He is positive for BRAF.   He received his first dose of immunotherapy with ipilimumab/nivolumab IV every 3 weeks on September 17th and tolerated this well.  However, he presented to the emergency department later that day due to persistent atrial fibrillation and high fever.  He was placed on medications and started on low-dose anticoagulation.  He was admitted with right lower lobe pneumonia and treated accordingly with cefepime and doxycycline.  He was discharged on September 23rd on Cardizem 30 mg TID, Entresto 24/26 BID, digoxin 0.125 mg daily, metoprolol 50 mg BID, Eliquis 2.5 mg BID, doxycycline 100 mg BID #10, and Farxiga 10 mg daily.  He has had some increase in the skin lesions while on immunotherapy, which we feel was possibly pseudoprogression.  Some of the lesions are decreasing and scabbing over.  CT chest from December 27th revealed interval growth of bulky heterogeneously enhancing left axillary and left retropectoral lymphadenopathy, as well as new left hilar lymphadenopathy.  There is also new subcentimeter pulmonary nodules in the lungs bilaterally, indeterminate for pulmonary metastases, and new small subcutaneous soft tissue nodule in the ventral upper left chest wall, suspicious for subcutaneous metastasis.  Indistinct clustered subcapsular foci of hyperenhancement  in the peripheral inferior right liver lobe, not convincingly changed, indeterminate, was also seen.  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  I cannot rule out  progression of his melanoma, but time will tell.  He was seen on January 10th for pain and swelling of the left axillary nodes.  Since he had leukocytosis and tenderness, he was placed on Keflex.  When I saw him for follow up, he was already improving.  MRI head on January 13th revealed mixed treatment response with slight enlargement of the right inferior temporal lobe lesion now measuring 17 x 11 mm, and new right frontal operculum lesion measuring 2 mm and left frontal leptomeningeal enhancement.  There is interval resolution of the leptomeningeal enhancement the left postcentral sulcus.    INTERVAL HISTORY:  He has been added onto the schedule today as the left axillary nodes worsened starting on Thursday.  He refused to let his wife call me until Sunday evening.  These are painful and associated with fever, and he rates his pain as an 8/10 today.  He has required Vicodin to control his pain.  He also reports poor appetite with abdominal pain, and mild nausea and vomiting.   There was indistinct clustered subcapsular foci of hyperenhancement in the peripheral inferior right liver lobe on CT imaging earlier this month.  Therefore, we will order MRI abdomen for further evaluation as he is symptomatic.  He needs a refill of metoprolol and as he does not have a primary care physician, I have agreed to refill this.  He has met with Dr. Orlene Erm to discuss radiation to the brain, and this will be further discussed by the Brain Tumor Team Conference at [facility].  His white count has increased from 11.3 to 15.5, and his hemoglobin and platelets are normal.  Chemistries are unremarkable.  His  appetite is good, and he has lost 2 and 1/2 pounds since his last visit.  He denies chills.  He denies bowel issues, or abdominal pain.  He denies sore throat, cough, dyspnea, or chest pain.  REVIEW OF SYSTEMS:  Review of Systems  Constitutional: Positive for appetite change (poor).  HENT:  Negative.   Eyes: Negative.    Respiratory: Negative.   Cardiovascular: Negative.   Gastrointestinal: Positive for abdominal pain, nausea (mild) and vomiting (mild).  Endocrine: Negative.   Genitourinary: Negative.    Musculoskeletal: Negative.   Skin: Negative.        Multiple scattered melanoma lesions of the skin  Neurological: Negative.   Hematological: Positive for adenopathy (left axillary, worse and painful).  Psychiatric/Behavioral: Negative.   All other systems reviewed and are negative.   VITALS:  Blood pressure 120/80, pulse 84, temperature 98.2 F (36.8 C), temperature source Oral, resp. rate 20, height '5\' 3"'  (1.6 m), weight 161 lb 1.6 oz (73.1 kg), SpO2 94 %.  Wt Readings from Last 3 Encounters:  06/26/20 161 lb 1.6 oz (73.1 kg)  06/16/20 163 lb 9.6 oz (74.2 kg)  06/12/20 163 lb 3.2 oz (74 kg)    Body mass index is 28.54 kg/m.  Performance status (ECOG): 1 - Symptomatic but completely ambulatory  PHYSICAL EXAM:  Physical Exam Constitutional:      General: He is not in acute distress.    Appearance: Normal appearance. He is normal weight.  HENT:     Head: Normocephalic and atraumatic.  Eyes:     General:  No scleral icterus.    Extraocular Movements: Extraocular movements intact.     Conjunctiva/sclera: Conjunctivae normal.     Pupils: Pupils are equal, round, and reactive to light.  Cardiovascular:     Rate and Rhythm: Normal rate and regular rhythm.     Pulses: Normal pulses.     Heart sounds: Normal heart sounds. No murmur heard. No friction rub. No gallop.   Pulmonary:     Effort: Pulmonary effort is normal. No respiratory distress.     Breath sounds: Normal breath sounds.  Abdominal:     General: Bowel sounds are normal. There is no distension.     Palpations: Abdomen is soft. There is no hepatomegaly, splenomegaly or mass.     Tenderness: There is no abdominal tenderness.  Musculoskeletal:        General: Normal range of motion.     Cervical back: Normal range of motion and  neck supple.     Right lower leg: No edema.     Left lower leg: No edema.  Lymphadenopathy:     Cervical: No cervical adenopathy.     Comments: Mass of the left axilla measuring at least 7 cm across, firm but mildly fluctuant.  He still has a subcutaneous nodule measuring about 1 cm in the left upper anterior chest.    Skin:    General: Skin is warm and dry.     Comments: Left shoulder incision is healing well.  Skin lesions remain stable.  Neurological:     General: No focal deficit present.     Mental Status: He is alert and oriented to person, place, and time. Mental status is at baseline.  Psychiatric:        Mood and Affect: Mood normal.        Behavior: Behavior normal.        Thought Content: Thought content normal.        Judgment: Judgment normal.     LABS:   CBC Latest Ref Rng & Units 06/07/2020 05/10/2020 04/14/2020  WBC - 13.5 9.7 10.0  Hemoglobin 13.5 - 17.5 16.1 16.0 15.2  Hematocrit 41 - 53 48 47 46.4  Platelets 150 - 399 367 327 363   CMP Latest Ref Rng & Units 06/07/2020 05/10/2020 04/14/2020  Glucose 70 - 99 mg/dL - - 117(H)  BUN 4 - '21 8 7 9  ' Creatinine 0.6 - 1.3 0.8 1.0 1.07  Sodium 137 - 147 139 141 138  Potassium 3.4 - 5.3 3.8 3.6 2.9(L)  Chloride 99 - 108 107 108 104  CO2 13 - 22 23(A) 24(A) 22  Calcium 8.7 - 10.7 9.4 9.0 9.3  Total Protein 6.5 - 8.1 g/dL - - 7.6  Total Bilirubin 0.3 - 1.2 mg/dL - - 0.7  Alkaline Phos 25 - 125 147(A) 132(A) 110  AST 14 - 40 38 29 23  ALT 10 - 40 33 26 21     STUDIES:   No current studies  HISTORY:   Allergies: No Known Allergies  Current Medications: Current Outpatient Medications  Medication Sig Dispense Refill  . benzonatate (TESSALON) 100 MG capsule Take 1 capsule by mouth three times daily as needed for cough 90 capsule 0  . digoxin (LANOXIN) 0.125 MG tablet Take 1 tablet (125 mcg total) by mouth daily. 30 tablet 5  . diltiazem (CARDIZEM) 30 MG tablet Take 1 tablet (30 mg total) by mouth 2 (two) times  daily. 180 tablet 3  . ELIQUIS 2.5 MG TABS tablet  Take 1 tablet (2.5 mg total) by mouth 2 (two) times daily. 60 tablet 2  . empagliflozin (JARDIANCE) 10 MG TABS tablet Take 1 tablet (10 mg total) by mouth daily before breakfast. 30 tablet 6  . ENTRESTO 24-26 MG Take 1 tablet by mouth 2 (two) times daily.    . furosemide (LASIX) 20 MG tablet Take 1 tablet (20 mg total) by mouth daily. 90 tablet 3  . HYDROcodone-acetaminophen (NORCO/VICODIN) 5-325 MG tablet Take 1 tablet by mouth every 4 (four) hours as needed. for pain    . metoprolol tartrate (LOPRESSOR) 50 MG tablet Take 1 tablet by mouth twice daily 60 tablet 0  . ondansetron (ZOFRAN) 4 MG tablet Take 4 mg by mouth every 8 (eight) hours as needed for nausea or vomiting.    Marland Kitchen oxyCODONE (OXY IR/ROXICODONE) 5 MG immediate release tablet Take 5 mg by mouth every 8 (eight) hours as needed.    . potassium chloride SA (KLOR-CON) 20 MEQ tablet Take 1 tablet (20 mEq total) by mouth daily. 90 tablet 3  . prochlorperazine (COMPAZINE) 10 MG tablet Take 10 mg by mouth every 6 (six) hours as needed for nausea or vomiting.     No current facility-administered medications for this visit.     ASSESSMENT & PLAN:   Assessment/Plan: 1. History of melanoma of the right upper abdominal wall in 2011, stage IA. This was treated with resection/wide excision with Dr. Michele Mcalpine.  This area remains without evidence of recurrence.    2.  Enhancement of the bilateral temporal lobes as seen on MRI imaging.  Most recent scan from January revealed enlarged right inferior temporal lobe lesion now measuring 17 x 11 mm, and new right frontal operculum lesion measuring 2 mm and left frontal leptomeningeal enhancement.  There is interval resolution of the leptomeningeal enhancement the left postcentral sulcus.  We feel this is metastatic melanoma and he has some mild nausea associated with this now.  He has been off corticosteroids for several months.    3.  Enlarged left axillary  and subpectoral metastatic lymph nodes measuring up to 2.7 cm.  These became more inflamed with a leukocytosis, so I treated him with Keflex and he initially improved.  However, the adenopathy has now worsened, and is painful.  I will therefore place him on another course of Keflex for 14 days, and we will administer an IV dose of rocephin today.  He has been using Vicodin for pain, and I will refill this today.   4.  Innumerable dark and pedunculated moles all over the body, which have worsened over the past few months, and clearly appear consistent with metastatic melanoma.  The largest measures about 3 cm of the middle back.  He has undergone biopsy and excision of two of these. We have stopped immunotherapy with nivolumab, and plan to pursue BRAF mutation targeted therapyl.  We have been hoping this represented pseudoprogression, but at this time we feel it is more consistent with disease progression.     5.  Multiple tiny bilateral pulmonary nodules, nonspecific.  These are likely benign, but could represent early metastatic disease.  We will continue to monitor.    6.  Positive BRAF mutation.  We will plan to switch him to oral therapy with trametinib and dabrafenib.  7. Atrial fibrillation.  He is on medications and low dose anticoagulation and follows with Dr. Bettina Gavia.  Cardiomyopathy.  He has been started on Entresto.    8. Right lower lobe pneumonia, resolved.  9.  Mediastinal and left axillary adenopathy with pulmonary nodules which could represent a sarcoidosis-like syndrome, but I cannot rule out progressive melanoma.  10.  Abdominal pain, decreased appetite, and occasional nausea and vomiting.  As he is symptomatic we will order MRI for further evaluation, as there was a small area of hyperenhancement in the liver on CT imaging.  Plan: The left axillary lymph nodes have again worsened with worsening leukocytosis, fever and pain.  I will therefore place him on another course of  antibiotics with Keflex for 14 days, and we will administer an IV dose of rocephin today.  This area is severely painful and he has been using Vicodin for pain, and I will refill this prescription.  I will also refill his metoprolol as he still is not established with a primary care physician.  He has also been having symptoms of abdominal pain, decreased appetite, mild nausea and occasional vomiting.  On CT imaging a indistinct clustered subcapsular foci of hyperenhancement in the peripheral inferior right liver lobe was seen.  Therefore, we will schedule an MRI abdomen for further evaluation as he is symptomatic.  He is in the process of being switched to oral therapy with trametinib and dabrafenib as he is positive for the BRAF mutation.  He has met with Dr. Orlene Erm to discuss radiation therapy and his case is being reviewed by the Brain Tumor Conference at [facility].  We have ordered Guardant testing of the recently resected lesion as well as Guardant liquid 360.  We have him keep his appointment in February as scheduled.  The patient and his wife understand the plans discussed today and are in agreement with them.  They know to contact our office if his develops concerns prior to his next appointment.   I provided 30 minutes of face-to-face time during this this encounter and > 50% was spent counseling as documented under my assessment and plan.    Derwood Kaplan, MD [facility] [address] [place] [address] Dept: [phone] Dept Fax: [phone]     I, Rita Ohara, am acting as scribe for Derwood Kaplan, MD  I have reviewed this report as typed by the medical scribe, and it is complete and accurate.

## 2020-06-26 NOTE — Addendum Note (Signed)
Addended by: Juanetta Beets on: 06/26/2020 11:41 AM   Modules accepted: Orders

## 2020-06-26 NOTE — Patient Instructions (Signed)
Ceftriaxone Injection What is this medicine? CEFTRIAXONE (sef try AX one) is a cephalosporin antibiotic. It treats some infections caused by bacteria. It will not work for colds, the flu, or other viruses. This medicine may be used for other purposes; ask your health care provider or pharmacist if you have questions. COMMON BRAND NAME(S): Ceftrisol Plus, Rocephin What should I tell my health care provider before I take this medicine? They need to know if you have any of these conditions:  any chronic illness  bowel disease, like colitis  both kidney and liver disease  high bilirubin level in newborn patients  an unusual or allergic reaction to ceftriaxone, other cephalosporin or penicillin antibiotics, foods, dyes, or preservatives  pregnant or trying to get pregnant  breast-feeding How should I use this medicine? This drug is injected into a muscle or a vein. It is usually given by a health care provider in a hospital or clinic setting. If you get this drug at home, you will be taught how to prepare and give it. Use exactly as directed. Take it as directed on the prescription label at the same time every day. Keep taking it unless your health care provider tells you to stop. It is important that you put your used needles and syringes in a special sharps container. Do not put them in a trash can. If you do not have a sharps container, call your pharmacist or health care provider to get one. Talk to your health care provider about the use of this drug in children. While it may be prescribed for children as young as newborns for selected conditions, precautions do apply. Overdosage: If you think you have taken too much of this medicine contact a poison control center or emergency room at once. NOTE: This medicine is only for you. Do not share this medicine with others. What if I miss a dose? It is important not to miss your dose. Call your health care provider if you are unable to keep an  appointment. If you give yourself this drug at home and you miss a dose, take it as soon as you can. If it is almost time for your next dose, take only that dose. Do not take double or extra doses. What may interact with this medicine? Do not take this medicine with any of the following medications:  intravenous calcium This medicine may also interact with the following medications:  birth control pills This list may not describe all possible interactions. Give your health care provider a list of all the medicines, herbs, non-prescription drugs, or dietary supplements you use. Also tell them if you smoke, drink alcohol, or use illegal drugs. Some items may interact with your medicine. What should I watch for while using this medicine? Tell your doctor or health care provider if your symptoms do not improve or if they get worse. This medicine may cause serious skin reactions. They can happen weeks to months after starting the medicine. Contact your health care provider right away if you notice fevers or flu-like symptoms with a rash. The rash may be red or purple and then turn into blisters or peeling of the skin. Or, you might notice a red rash with swelling of the face, lips or lymph nodes in your neck or under your arms. Do not treat diarrhea with over the counter products. Contact your doctor if you have diarrhea that lasts more than 2 days or if it is severe and watery. If you are being treated for   a sexually transmitted disease, avoid sexual contact until you have finished your treatment. Having sex can infect your sexual partner. Calcium may bind to this medicine and cause lung or kidney problems. Avoid calcium products while taking this medicine and for 48 hours after taking the last dose of this medicine. What side effects may I notice from receiving this medicine? Side effects that you should report to your doctor or health care professional as soon as possible:  allergic reactions like  skin rash, itching or hives, swelling of the face, lips, or tongue  breathing problems  fever, chills  irregular heartbeat  pain when passing urine  redness, blistering, peeling, or loosening of the skin, including inside the mouth  seizures  stomach pain, cramps  unusual bleeding, bruising  unusually weak or tired Side effects that usually do not require medical attention (report to your doctor or health care professional if they continue or are bothersome):  diarrhea  dizzy, drowsy  headache  nausea, vomiting  pain, swelling, irritation where injected  stomach upset  sweating This list may not describe all possible side effects. Call your doctor for medical advice about side effects. You may report side effects to FDA at 1-800-FDA-1088. Where should I keep my medicine? Keep out of the reach of children and pets. You will be instructed on how to store this drug. Protect from light. Throw away any unused drug after the expiration date. NOTE: This sheet is a summary. It may not cover all possible information. If you have questions about this medicine, talk to your doctor, pharmacist, or health care provider.  2021 Elsevier/Gold Standard (2018-12-24 18:29:21)  

## 2020-06-27 ENCOUNTER — Other Ambulatory Visit: Payer: Self-pay | Admitting: Oncology

## 2020-06-27 ENCOUNTER — Other Ambulatory Visit: Payer: Self-pay | Admitting: Radiation Therapy

## 2020-06-27 DIAGNOSIS — C7931 Secondary malignant neoplasm of brain: Secondary | ICD-10-CM

## 2020-06-27 DIAGNOSIS — C7949 Secondary malignant neoplasm of other parts of nervous system: Secondary | ICD-10-CM

## 2020-06-27 LAB — T4: T4, Total: 7 ug/dL (ref 4.5–12.0)

## 2020-06-27 NOTE — Progress Notes (Signed)
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 Michele Mcalpine.  He had several areas of suspicious skin lesions develop, which seem to have worsened.  He had not been following with dermatology routinely.  He presented to the [facility] Emergency Department on August 4th due to a 2 month history of headaches.  These occur 3-4 times per week, usually of the right temporal area, but occasionally the left temporal as well.  The headaches are associated with vertigo upon standing or when looking down, nausea and fatigue.  He denied any visual acuity changes, numbness or tingling.  MRI Head w/w/o Contrast 06/15/2020   Pain on a scale of 0-10 is: Reports 9 out of 10 pain to his left axilla. Currently taking PO antibiotics to try and help the swelling.  Ambulatory status? Walker? Wheelchair?: Able to ambulate unassisted  Past or anticipated interventions, if any, per neurosurgery:  Nothing scheduled at this time  Past or anticipated interventions, if any, per medical oncology:  Dr. Hosie Poisson 06/26/2020 Plan: The left axillary lymph nodes have again worsened with worsening leukocytosis, fever and pain.  I will therefore place him on another course of antibiotics with Keflex for 14 days, and we will administer an IV dose of rocephin today.  This area is severely painful and he has been using Vicodin for pain, and I will refill this prescription.  I will also refill his metoprolol as he still is not established with a primary care physician.  He has also been having symptoms of abdominal pain, decreased appetite, mild nausea and occasional vomiting.  On CT imaging a indistinct clustered subcapsular foci of hyperenhancement in the peripheral  inferior right liver lobe was seen.  Therefore, we will schedule an MRI abdomen for further evaluation as he is symptomatic.  He is in the process of being switched to oral therapy with trametinib and dabrafenib as he is positive for the BRAF mutation.  He has met with Dr. Orlene Erm to discuss radiation therapy and his case is being reviewed by the Brain Tumor Conference at [facility].  We have ordered Guardant testing of the recently resected lesion as well as Guardant liquid 360.We have him keep his appointment in February as scheduled.  The patient and his wife understand the plans discussed today and are in agreement with them.  They know to contact our office if his develops concerns prior to his next appointment.  Dose of Decadron, if applicable: N/A  Recent neurologic symptoms, if any:   Seizures: Patient denies  Headaches: Patient denies; Had headaches initially which prompted diagnosis, but states they have not returned  Nausea: Reports on-going issues with nausea and decreased appetite  Dizziness/ataxia: Patient denies  Difficulty with hand coordination: Patient denies  Focal numbness/weakness: Reports generalized weakness in his extremities   Visual deficits/changes: Patient denies  Confusion/Memory deficits: Patient denies  SAFETY ISSUES:  Prior radiation? No  Pacemaker/ICD? No  Possible current pregnancy? N/A  Is the patient on methotrexate? No  Additional Complaints / other details:  Scheduled for MRI of brain w/w/o contrast (3T SRS protocol) on 07/05/2020. Appointment with Dr. Kary Kos on 07/13/2020. Patient has not received COVID vaccine or annual flu shot

## 2020-06-28 ENCOUNTER — Ambulatory Visit
Admission: RE | Admit: 2020-06-28 | Discharge: 2020-06-28 | Disposition: A | Discharge status: Home / Self Care | Payer: 59 | Source: Ambulatory Visit | Attending: Radiation Oncology | Admitting: Radiation Oncology

## 2020-06-28 ENCOUNTER — Encounter: Payer: Self-pay | Admitting: Radiation Oncology

## 2020-06-28 ENCOUNTER — Telehealth: Payer: Self-pay | Admitting: Oncology

## 2020-06-28 ENCOUNTER — Other Ambulatory Visit: Payer: Self-pay

## 2020-06-28 ENCOUNTER — Ambulatory Visit: Payer: 59

## 2020-06-28 VITALS — BP 111/75 | HR 84 | Temp 98.2°F | Resp 17 | Wt 161.2 lb

## 2020-06-28 DIAGNOSIS — C439 Malignant melanoma of skin, unspecified: Secondary | ICD-10-CM | POA: Diagnosis not present

## 2020-06-28 DIAGNOSIS — Z5112 Encounter for antineoplastic immunotherapy: Secondary | ICD-10-CM | POA: Diagnosis not present

## 2020-06-28 DIAGNOSIS — R110 Nausea: Secondary | ICD-10-CM | POA: Diagnosis not present

## 2020-06-28 DIAGNOSIS — Z87891 Personal history of nicotine dependence: Secondary | ICD-10-CM | POA: Insufficient documentation

## 2020-06-28 DIAGNOSIS — C7931 Secondary malignant neoplasm of brain: Secondary | ICD-10-CM

## 2020-06-28 DIAGNOSIS — R222 Localized swelling, mass and lump, trunk: Secondary | ICD-10-CM | POA: Insufficient documentation

## 2020-06-28 DIAGNOSIS — I4891 Unspecified atrial fibrillation: Secondary | ICD-10-CM | POA: Insufficient documentation

## 2020-06-28 DIAGNOSIS — Z7901 Long term (current) use of anticoagulants: Secondary | ICD-10-CM | POA: Insufficient documentation

## 2020-06-28 NOTE — Telephone Encounter (Signed)
06/28/20 Spoke with wife and resch appts

## 2020-06-28 NOTE — Progress Notes (Signed)
   [id number]  Name:  Adam Bender.    MRN: [id number] DOB: 04-18-1961  06/28/2020  [name] was observed post Covid-19 immunization for 15 minutes without incident. He was provided with Vaccine Information Sheet and instruction to access the V-Safe system.   [name] was instructed to call 911 with any severe reactions post vaccine: Marland Kitchen Difficulty breathing  . Swelling of face and throat  . A fast heartbeat  . A bad rash all over body  . Dizziness and weakness

## 2020-06-28 NOTE — Progress Notes (Addendum)
Radiation Oncology         [phone]) [phone] ________________________________  Initial Outpatient Consultation  Name: Adam Bender. MRN: [id number]  Date: 06/28/2020  DOB: 1960/07/24  IR:CVELFYB, Provider, MD  Gatha Mayer, MD   REFERRING PHYSICIAN: Hosie Poisson MD  DIAGNOSIS:     ICD-10-CM   1. Brain metastases ([place])  C79.31      HISTORY OF PRESENT ILLNESS::Adam Bender. is a 60 y.o. 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 Since then, he has had numerous black skin lesions scattered over his body that have been enlarging.  He reports that they grow faster if they undergo trauma.  In more recent history, the patient presented to the [facility] ED on 01/05/2020 for evaluation of two-month history of headaches.  At the time, the patient also had vertigo when looking down as well as nausea and fatigue.  CT scan of head at that time showed subcortical white matter edema involving the left temporal lobe concerning for infraction or underlying neoplasm. Correlating MRI of head confirmed homogeneous enhancement at the periphery of the anterior left temporal lobe that measured 1.9 x 1.5 x 2.4 cm. There was also noted to be adjacent parenchymal edema. Additional periphery of the inferior right temporal lobe measured 1.0 x 1.5 x 0.5 cm also with adjacent parenchymal edema. Finally, there was a 4 mm focus of enhancement at the periphery of the right temporal lobe.  Subsequently, the patient was seen by Dr. 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  CT scan of chest/abdomen/pelvis on 01/12/2020 revealed enlarged left axillary and subpectoral lymph nodes that measured up to 2.0 x 1.9 cm. There were also noted to be multiple tiny bilateral pulmonary nodules  that were nonspecific, the largest of which was 3 mm. There was no evidence of metastatic disease in the abdomen or pelvis.  The patient underwent immunotherapy with Ipilimumab/Nivolumab IV starting on 02/18/2020.  MRI of head on 06/15/2020 showed mixed treatment response with enlarged right inferior temporal lobe lesion and new right frontal operculum lesion and left frontal leptomeningeal enhancement. There was also noted to be interval resolution of leptomeningeal enhancement at the left postcentral sulcus.  3 Tesla MRI is pending.  The patient was reviewed at our CNS tumor board and the consensus was that the patient could be considered for whole brain radiation therapy versus radiosurgery, depending upon his goals of care.  Dr. Hinton Rao plans to switch him to oral therapy with trametinib and dabrafenib.  He has an enlarging left axillary mass that is painful and progressive on antibiotics.  This is his main symptomatic complaint.  He reports that he has not had any recent headaches or dizziness and he is no longer on steroids.  He denies any focal neurologic deficits.  He does report generalized weakness.  He denies any visual changes.  He does report continuous nausea.  He does have persistent tinnitus that has been going on for months.  He denies any history of seizures.  He has not received a Covid vaccine.   PREVIOUS RADIATION THERAPY: No  PAST MEDICAL HISTORY:  has a past medical history of Atrial fibrillation ([place]) (02/29/2020), Depressed left ventricular ejection fraction (02/29/2020), Malignant melanoma of other part of trunk ([place]) (02/20/2020), and Malignant melanoma of skin of trunk, except scrotum ([place]).  PAST SURGICAL HISTORY: Past Surgical History:  Procedure Laterality Date  . MOLE REMOVAL    . SALIVARY GLAND SURGERY      FAMILY HISTORY: family history includes Hypertension in his father and mother.  SOCIAL HISTORY:  reports that he has quit smoking. He has never used smokeless  tobacco.  ALLERGIES: Patient has no known allergies.  MEDICATIONS:  Current Outpatient Medications  Medication Sig Dispense Refill  . benzonatate (TESSALON) 100 MG capsule Take 1 capsule by mouth three times daily as needed for cough 90 capsule 0  . cephALEXin (KEFLEX) 500 MG capsule Take 1 capsule (500 mg total) by mouth 3 (three) times daily. 42 capsule 0  . digoxin (LANOXIN) 0.125 MG tablet Take 1 tablet (125 mcg total) by mouth daily. 30 tablet 5  . diltiazem (CARDIZEM) 30 MG tablet Take 1 tablet (30 mg total) by mouth 2 (two) times daily. 180 tablet 3  . ELIQUIS 2.5 MG TABS tablet Take 1 tablet (2.5 mg total) by mouth 2 (two) times daily. 60 tablet 2  . empagliflozin (JARDIANCE) 10 MG TABS tablet Take 1 tablet (10 mg total) by mouth daily before breakfast. 30 tablet 6  . ENTRESTO 24-26 MG Take 1 tablet by mouth 2 (two) times daily.    Marland Kitchen HYDROcodone-acetaminophen (NORCO/VICODIN) 5-325 MG tablet Take 1 tablet by mouth every 4 (four) hours as needed. for pain 120 tablet 0  . metoprolol tartrate (LOPRESSOR) 50 MG tablet Take 1 tablet (50 mg total) by mouth 2 (two) times daily. 60 tablet 5  . ondansetron (ZOFRAN) 4 MG tablet Take 4 mg by mouth every 8 (eight) hours as needed for nausea or vomiting.    Marland Kitchen oxyCODONE (OXY IR/ROXICODONE) 5 MG immediate release tablet Take 5 mg by mouth every 8 (eight) hours as needed.    . potassium chloride SA (KLOR-CON) 20 MEQ tablet Take 1 tablet (20 mEq total) by mouth daily. 90 tablet 3  . prochlorperazine (COMPAZINE) 10 MG tablet Take 10 mg by mouth every 6 (six) hours as needed for nausea or vomiting.    . furosemide (LASIX) 20 MG tablet Take 1 tablet (20 mg total) by mouth daily. 90 tablet 3   No current facility-administered medications for this encounter.    REVIEW OF SYSTEMS:  As above.   PHYSICAL EXAM:  weight is 161 lb 4 oz (73.1 kg). His oral temperature is 98.2 F (36.8 C). His blood pressure is 111/75 and his pulse is 84. His respiration is 17  and oxygen saturation is 97%.   General: Alert and oriented, in no acute distress  HEENT: Head is normocephalic. Extraocular movements are intact.  Neck: Neck is supple, no palpable cervical or supraclavicular lymphadenopathy. Lymphatics: see Neck Exam; in the left axilla he has a large tender palpable mass that is several centimeters in dimension.  No evidence of cellulitis. Skin: Multiple dark lesions over his skin.  Most of these are relatively symmetric and raised Musculoskeletal: symmetric strength and muscle tone throughout. Neurologic: Cranial nerves II through XII are grossly intact. No obvious focalities. Speech is fluent. Coordination is intact. Psychiatric: Judgment and insight are intact. Affect is appropriate.  KPS = 80  100 - Normal; no complaints; no evidence of disease. 90   - Able to carry on normal activity; minor signs or symptoms of disease. 80   - Normal activity with effort; some signs or symptoms of disease. 29   - Cares for self; unable to carry on normal activity or to do active  work. 60   - Requires occasional assistance, but is able to care for most of his personal needs. 50   - Requires considerable assistance and frequent medical care. 65   - Disabled; requires special care and assistance. 102   - Severely disabled; hospital admission is indicated although death not imminent. 28   - Very sick; hospital admission necessary; active supportive treatment necessary. 10   - Moribund; fatal processes progressing rapidly. 0     - Dead  Karnofsky DA, Abelmann Wallula, Craver LS and [place] JH [phone]) The use of the nitrogen mustards in the palliative treatment of carcinoma: with particular reference to bronchogenic carcinoma Cancer 1 [phone]  LABORATORY DATA:  Lab Results  Component Value Date   WBC 15.5 06/26/2020   HGB 16.1 06/26/2020   HCT 49 06/26/2020   MCV 91.2 04/14/2020   PLT 365 06/26/2020   CMP     Component Value Date/Time   NA 138 06/26/2020 0000   K  3.8 06/26/2020 0000   CL 105 06/26/2020 0000   CO2 25 (A) 06/26/2020 0000   GLUCOSE 117 (H) 04/14/2020 1446   BUN 9 06/26/2020 0000   CREATININE 0.9 06/26/2020 0000   CREATININE 1.07 04/14/2020 1446   CALCIUM 9.4 06/26/2020 0000   PROT 7.6 04/14/2020 1446   ALBUMIN 3.9 06/26/2020 0000   AST 33 06/26/2020 0000   AST 23 04/14/2020 1446   ALT 20 06/26/2020 0000   ALT 21 04/14/2020 1446   ALKPHOS 157 (A) 06/26/2020 0000   BILITOT 0.7 04/14/2020 1446   GFRNONAA >60 04/14/2020 1446   GFRAA 109 02/29/2020 1538         RADIOGRAPHY: ECHOCARDIOGRAM COMPLETE  Result Date: 06/17/2020    ECHOCARDIOGRAM REPORT   Patient Name:   Adam Lucken. Date of Exam: 06/16/2020 Medical Rec #:  [id number]             Height:       63.0 in Accession #:    [id number]            Weight:       163.2 lb Date of Birth:  22-Feb-1961             BSA:          1.774 m Patient Age:    59 years              BP:           116/70 mmHg Patient Gender: M                     HR:           74 bpm. Exam Location:  [place] Procedure: 2D Echo, 3D Echo, Cardiac Doppler and Color Doppler Indications:    I48.1 Persistent atrial fibrillation; I33.82 Chronic systolic                 (congestive) heart failure  History:        Patient has prior history of Echocardiogram examinations, most                 recent 02/17/2020. CHF, Arrythmias:Atrial Fibrillation; Risk                 Factors:Former Smoker.  Sonographer:    Geradine Girt Referring Phys: [phone] [place]  1. Left ventricular ejection fraction, by estimation, is 60 to 65%. The left ventricle has normal function. The left ventricle has  no regional wall motion abnormalities. Left ventricular diastolic parameters are indeterminate.  2. Right ventricular systolic function is normal. The right ventricular size is normal.  3. The mitral valve is normal in structure. Trivial mitral valve regurgitation. No evidence of mitral stenosis.  4. The aortic valve is normal in  structure. There is mild calcification of the aortic valve. There is mild thickening of the aortic valve. Aortic valve regurgitation is not visualized. No aortic stenosis is present.  5. The inferior vena cava is normal in size with greater than 50% respiratory variability, suggesting right atrial pressure of 3 mmHg. FINDINGS  Left Ventricle: Left ventricular ejection fraction, by estimation, is 60 to 65%. The left ventricle has normal function. The left ventricle has no regional wall motion abnormalities. The left ventricular internal cavity size was normal in size. There is  no left ventricular hypertrophy. Left ventricular diastolic parameters are indeterminate. Right Ventricle: The right ventricular size is normal. No increase in right ventricular wall thickness. Right ventricular systolic function is normal. Left Atrium: Left atrial size was normal in size. Right Atrium: Right atrial size was normal in size. Pericardium: There is no evidence of pericardial effusion. Mitral Valve: The mitral valve is normal in structure. Trivial mitral valve regurgitation. No evidence of mitral valve stenosis. Tricuspid Valve: The tricuspid valve is normal in structure. Tricuspid valve regurgitation is not demonstrated. No evidence of tricuspid stenosis. Aortic Valve: The aortic valve is normal in structure. There is mild calcification of the aortic valve. There is mild thickening of the aortic valve. Aortic valve regurgitation is not visualized. No aortic stenosis is present. Pulmonic Valve: The pulmonic valve was normal in structure. Pulmonic valve regurgitation is not visualized. No evidence of pulmonic stenosis. Aorta: The aortic root is normal in size and structure. Venous: The inferior vena cava is normal in size with greater than 50% respiratory variability, suggesting right atrial pressure of 3 mmHg. IAS/Shunts: No atrial level shunt detected by color flow Doppler.  LEFT VENTRICLE PLAX 2D LVIDd:         4.73 cm LVIDs:          3.59 cm LV PW:         0.95 cm LV IVS:        0.94 cm LVOT diam:     1.80 cm  3D Volume EF: LV SV:         42       3D EF:        41 % LV SV Index:   24       LV EDV:       143 ml LVOT Area:     2.54 cm LV ESV:       84 ml                         LV SV:        59 ml RIGHT VENTRICLE TAPSE (M-mode): 1.4 cm LEFT ATRIUM             Index       RIGHT ATRIUM           Index LA Vol (A2C):   46.1 ml 25.99 ml/m RA Area:     12.80 cm LA Vol (A4C):   51.5 ml 29.04 ml/m RA Volume:   25.50 ml  14.38 ml/m LA Biplane Vol: 50.8 ml 28.64 ml/m  AORTIC VALVE LVOT Vmax:   93.30 cm/s LVOT Vmean:  74.400  cm/s LVOT VTI:    0.166 m  AORTA Ao Root diam: 2.80 cm Ao Asc diam:  3.10 cm MITRAL VALVE MV Area (PHT): 3.12 cm    SHUNTS MV Decel Time: 243 msec    Systemic VTI:  0.17 m MV E velocity: 92.10 cm/s  Systemic Diam: 1.80 cm Jenne Campus MD Electronically signed by Jenne Campus MD Signature Date/Time: 06/17/2020/11:20:39 AM    Final       IMPRESSION/PLAN: This is a very pleasant 60 year old male with metastatic disease to the brain.  I had a lengthy discussion with the patient after reviewing their MRI results with them.  We spoke about whole brain radiotherapy versus stereotactic radiosurgery to the brain. We spoke about the differing risks benefits and side effects of both of these treatments. During part of our discussion, we spoke about the hair loss, fatigue and cognitive effects that can result from whole brain radiotherapy.  Additionally, we spoke about radionecrosis that can result from stereotactic radiosurgery. I explained that whole brain radiotherapy is more comprehensive and therefore can decrease the chance of recurrences elsewhere in the brain, while stereotactic radiosurgery only treats the areas of gross disease while sparing the rest of the brain parenchyma.  Given the histology of his brain metastases (which traditionally can be insensitive to fractionated whole brain radiation), I would favor  stereotactic ablative radiosurgery, as it is more likely to provide local control of his parenchymal lesions.  I explained to him that after assessing his 3T MRI results, I will finalize which lesions should be targeted.  Anticipate we will treat the parenchymal lesions and potentially follow the leptomeningeal lesions.  If he develops widespread leptomeningeal disease he may need whole brain radiation therapy in the future.  We also discussed the option of observation with serial MRIs. After lengthy discussion, the patient would like to proceed with stereotactic brain radiosurgery to their metastatic disease. They will meet with neurosurgery in the near future to discuss this further; a neurosurgeon will participate in their case.  CT simulation will take place in early February and treatment on February 11.   I have contacted Dr. Hinton Rao to gauge her suspicion that the left axillary mass, which is progressive on antibiotics, has metastatic disease.  If there is a reasonable level of suspicion that this is cancerous and not just infectious, I believe it is prudent for [place] to attempt a short palliative course of radiation therapy to the left axilla.  The patient has been consented for that as well and he is enthusiastic about pursuing this if Dr. Hinton Rao agrees.  We discussed measures to reduce the risk of infection during the COVID-19 pandemic.  He has not been vaccinated yet.  I recommend that he get fully vaccinated as he is at relatively high risk of morbidity from Covid given his medical history.  He is enthusiastic about pursuing this and we will get him set up for his first shot of [place] today in his right arm.  The patient and his wife had questions about his prognosis and had end-of-life questions as well.  I answered these to the best my ability and also encouraged him to talk to Dr. Hinton Rao about their questions.  They understand that his disease is treatable but not curative.  They understand  that he has a serious disease that is stage IV.  Overall, I conveyed that his prognosis is guarded, but that in the best case scenarios we have seen patients with his performance status  live for years if they have a favorable response to systemic therapy and radiation.  I offered to refer them to palliative medicine at any time they desire.     On date of service, in total, I spent 65 minutes on this encounter. Patient was seen in person.  __________________________________________   Eppie Gibson, MD  This document serves as a record of services personally performed by Eppie Gibson, MD. It was created on his behalf by Clerance Lav, a trained medical scribe. The creation of this record is based on the scribe's personal observations and the provider's statements to them. This document has been checked and approved by the attending provider.

## 2020-06-28 NOTE — Telephone Encounter (Signed)
06/27/20 Left msg about MRI and adjusted time on DR/LAB APPTS

## 2020-06-29 ENCOUNTER — Inpatient Hospital Stay: Payer: 59

## 2020-06-29 ENCOUNTER — Encounter: Payer: Self-pay | Admitting: Oncology

## 2020-06-29 ENCOUNTER — Other Ambulatory Visit: Payer: Self-pay | Admitting: Hematology and Oncology

## 2020-06-29 ENCOUNTER — Inpatient Hospital Stay (INDEPENDENT_AMBULATORY_CARE_PROVIDER_SITE_OTHER): Payer: 59 | Admitting: Oncology

## 2020-06-29 ENCOUNTER — Other Ambulatory Visit: Payer: Self-pay | Admitting: Oncology

## 2020-06-29 VITALS — BP 127/87 | HR 102 | Temp 98.1°F | Resp 18 | Ht 63.0 in | Wt 161.6 lb

## 2020-06-29 VITALS — BP 129/72 | HR 82 | Temp 98.5°F | Resp 18 | Ht 63.0 in | Wt 162.0 lb

## 2020-06-29 DIAGNOSIS — C7931 Secondary malignant neoplasm of brain: Secondary | ICD-10-CM

## 2020-06-29 DIAGNOSIS — Z5112 Encounter for antineoplastic immunotherapy: Secondary | ICD-10-CM | POA: Diagnosis not present

## 2020-06-29 DIAGNOSIS — R112 Nausea with vomiting, unspecified: Secondary | ICD-10-CM

## 2020-06-29 DIAGNOSIS — C4359 Malignant melanoma of other part of trunk: Secondary | ICD-10-CM | POA: Diagnosis not present

## 2020-06-29 DIAGNOSIS — R599 Enlarged lymph nodes, unspecified: Secondary | ICD-10-CM

## 2020-06-29 LAB — CBC AND DIFFERENTIAL
HCT: 49 (ref 41–53)
Hemoglobin: 16 (ref 13.5–17.5)
Neutrophils Absolute: 14.12
Platelets: 404 — AB (ref 150–399)
WBC: 18.1

## 2020-06-29 LAB — BASIC METABOLIC PANEL
BUN: 9 (ref 4–21)
CO2: 30 — AB (ref 13–22)
Chloride: 102 (ref 99–108)
Creatinine: 0.9 (ref 0.6–1.3)
Glucose: 126
Potassium: 3.8 (ref 3.4–5.3)
Sodium: 138 (ref 137–147)

## 2020-06-29 LAB — CBC: RBC: 5.57 — AB (ref 3.87–5.11)

## 2020-06-29 LAB — HEPATIC FUNCTION PANEL
ALT: 19 (ref 10–40)
AST: 30 (ref 14–40)
Alkaline Phosphatase: 162 — AB (ref 25–125)
Bilirubin, Total: 0.6

## 2020-06-29 LAB — COMPREHENSIVE METABOLIC PANEL
Albumin: 4.1 (ref 3.5–5.0)
Calcium: 9.6 (ref 8.7–10.7)

## 2020-06-29 MED ORDER — LEVOFLOXACIN 500 MG PO TABS: 500.0000 mg | ORAL_TABLET | Freq: Every day | ORAL | 0 refills | Status: AC

## 2020-06-29 MED ORDER — ONDANSETRON 4 MG PO TBDP: 4.0000 mg | ORAL_TABLET | ORAL | 5 refills | Status: AC | PRN

## 2020-06-29 MED ORDER — ONDANSETRON HCL 40 MG/20ML IJ SOLN
8.0000 mg | Freq: Once | INTRAMUSCULAR | Status: AC
  Administered 2020-06-29: 8 mg via INTRAVENOUS
  Filled 2020-06-29: qty 4

## 2020-06-29 MED ORDER — HEPARIN SOD (PORK) LOCK FLUSH 100 UNIT/ML IV SOLN
500.0000 [IU] | Freq: Once | INTRAVENOUS | Status: AC | PRN
  Administered 2020-06-29: 500 [IU]
  Filled 2020-06-29: qty 5

## 2020-06-29 MED ORDER — SODIUM CHLORIDE 0.9 % IV SOLN
Freq: Once | INTRAVENOUS | Status: AC
  Filled 2020-06-29: qty 250

## 2020-06-29 MED ORDER — DEXAMETHASONE SODIUM PHOSPHATE 10 MG/ML IJ SOLN
INTRAMUSCULAR | Status: AC
  Filled 2020-06-29: qty 1

## 2020-06-29 MED ORDER — ONDANSETRON HCL 4 MG/2ML IJ SOLN
INTRAMUSCULAR | Status: AC
  Filled 2020-06-29: qty 4

## 2020-06-29 MED ORDER — DEXAMETHASONE SODIUM PHOSPHATE 10 MG/ML IJ SOLN
10.0000 mg | Freq: Once | INTRAMUSCULAR | Status: AC
  Administered 2020-06-29: 10 mg via INTRAVENOUS

## 2020-06-29 NOTE — Patient Instructions (Signed)
Dehydration, Adult Dehydration is condition in which there is not enough water or other fluids in the body. This happens when a person loses more fluids than he or she takes in. Important body parts cannot work right without the right amount of fluids. Any loss of fluids from the body can cause dehydration. Dehydration can be mild, worse, or very bad. It should be treated right away to keep it from getting very bad. What are the causes? This condition may be caused by:  Conditions that cause loss of water or other fluids, such as: ? Watery poop (diarrhea). ? Vomiting. ? Sweating a lot. ? Peeing (urinating) a lot.  Not drinking enough fluids, especially when you: ? Are ill. ? Are doing things that take a lot of energy to do.  Other illnesses and conditions, such as fever or infection.  Certain medicines, such as medicines that take extra fluid out of the body (diuretics).  Lack of safe drinking water.  Not being able to get enough water and food. What increases the risk? The following factors may make you more likely to develop this condition:  Having a long-term (chronic) illness that has not been treated the right way, such as: ? Diabetes. ? Heart disease. ? Kidney disease.  Being 65 years of age or older.  Having a disability.  Living in a place that is high above the ground or sea (high in altitude). The thinner, dried air causes more fluid loss.  Doing exercises that put stress on your body for a long time. What are the signs or symptoms? Symptoms of dehydration depend on how bad it is. Mild or worse dehydration  Thirst.  Dry lips or dry mouth.  Feeling dizzy or light-headed, especially when you stand up from sitting.  Muscle cramps.  Your body making: ? Dark pee (urine). Pee may be the color of tea. ? Less pee than normal. ? Less tears than normal.  Headache. Very bad dehydration  Changes in skin. Skin may: ? Be cold to the touch (clammy). ? Be blotchy  or pale. ? Not go back to normal right after you lightly pinch it and let it go.  Little or no tears, pee, or sweat.  Changes in vital signs, such as: ? Fast breathing. ? Low blood pressure. ? Weak pulse. ? Pulse that is more than 100 beats a minute when you are sitting still.  Other changes, such as: ? Feeling very thirsty. ? Eyes that look hollow (sunken). ? Cold hands and feet. ? Being mixed up (confused). ? Being very tired (lethargic) or having trouble waking from sleep. ? Short-term weight loss. ? Loss of consciousness. How is this treated? Treatment for this condition depends on how bad it is. Treatment should start right away. Do not wait until your condition gets very bad. Very bad dehydration is an emergency. You will need to go to a hospital.  Mild or worse dehydration can be treated at home. You may be asked to: ? Drink more fluids. ? Drink an oral rehydration solution (ORS). This drink helps get the right amounts of fluids and salts and minerals in the blood (electrolytes).  Very bad dehydration can be treated: ? With fluids through an IV tube. ? By getting normal levels of salts and minerals in your blood. This is often done by giving salts and minerals through a tube. The tube is passed through your nose and into your stomach. ? By treating the root cause. Follow these instructions at   home: Oral rehydration solution If told by your doctor, drink an ORS:  Make an ORS. Use instructions on the package.  Start by drinking small amounts, about  cup (120 mL) every 5-10 minutes.  Slowly drink more until you have had the amount that your doctor said to have. Eating and drinking  Drink enough clear fluid to keep your pee pale yellow. If you were told to drink an ORS, finish the ORS first. Then, start slowly drinking other clear fluids. Drink fluids such as: ? Water. Do not drink only water. Doing that can make the salt (sodium) level in your body get too low. ? Water  from ice chips you suck on. ? Fruit juice that you have added water to (diluted). ? Low-calorie sports drinks.  Eat foods that have the right amounts of salts and minerals, such as: ? Bananas. ? Oranges. ? Potatoes. ? Tomatoes. ? Spinach.  Do not drink alcohol.  Avoid: ? Drinks that have a lot of sugar. These include:  High-calorie sports drinks.  Fruit juice that you did not add water to.  Soda.  Caffeine. ? Foods that are greasy or have a lot of fat or sugar.         General instructions  Take over-the-counter and prescription medicines only as told by your doctor.  Do not take salt tablets. Doing that can make the salt level in your body get too high.  Return to your normal activities as told by your doctor. Ask your doctor what activities are safe for you.  Keep all follow-up visits as told by your doctor. This is important. Contact a doctor if:  You have pain in your belly (abdomen) and the pain: ? Gets worse. ? Stays in one place.  You have a rash.  You have a stiff neck.  You get angry or annoyed (irritable) more easily than normal.  You are more tired or have a harder time waking than normal.  You feel: ? Weak or dizzy. ? Very thirsty. Get help right away if you have:  Any symptoms of very bad dehydration.  Symptoms of vomiting, such as: ? You cannot eat or drink without vomiting. ? Your vomiting gets worse or does not go away. ? Your vomit has blood or green stuff in it.  Symptoms that get worse with treatment.  A fever.  A very bad headache.  Problems with peeing or pooping (having a bowel movement), such as: ? Watery poop that gets worse or does not go away. ? Blood in your poop (stool). This may cause poop to look black and tarry. ? Not peeing in 6-8 hours. ? Peeing only a small amount of very dark pee in 6-8 hours.  Trouble breathing. These symptoms may be an emergency. Do not wait to see if the symptoms will go away. Get  medical help right away. Call your local emergency services (911 in the [place] Do not drive yourself to the hospital. Summary  Dehydration is a condition in which there is not enough water or other fluids in the body. This happens when a person loses more fluids than he or she takes in.  Treatment for this condition depends on how bad it is. Treatment should be started right away. Do not wait until your condition gets very bad.  Drink enough clear fluid to keep your pee pale yellow. If you were told to drink an oral rehydration solution (ORS), finish the ORS first. Then, start slowly drinking other clear fluids.    Take over-the-counter and prescription medicines only as told by your doctor.  Get help right away if you have any symptoms of very bad dehydration. This information is not intended to replace advice given to you by your health care provider. Make sure you discuss any questions you have with your health care provider. Document Revised: 12/31/2018 Document Reviewed: 12/31/2018 Elsevier Patient Education  2021 [employer]  

## 2020-06-29 NOTE — Addendum Note (Signed)
Addended by: Juanetta Beets on: 06/29/2020 10:34 AM   Modules accepted: Orders

## 2020-06-29 NOTE — Progress Notes (Signed)
[name]  [address] [place],  [place]  [address] [phone]  Clinic Day:  06/29/2020  Referring physician: Derwood Kaplan, MD   CHIEF COMPLAINT:  CC: Left axillary pain and increasing melanoma skin lesions.  Current Treatment:  Will plan to switch him to BRAF targeted oral therapy and radiation of the brain   HISTORY OF PRESENT ILLNESS:  Adam Bender. is a 60 y.o. 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 Michele Bender.  He had several areas of suspicious skin lesions develop, which seem to have worsened.  He had not been following with dermatology routinely.  He presented to the [facility] Emergency Department on August 4th due to a 2 month history of headaches.  These occur 3-4 times per week, usually of the right temporal area, but occasionally the left temporal as well.  The headaches are associated with vertigo upon standing or when looking down, nausea and fatigue.  He denied any visual acuity changes, numbness or tingling.  CT head imaging revealed subcortical white matter edema involving the left temporal lobe concerning for infarction or underlying neoplasm.  Correlating MRI head confirmed homogeneous enhancement at the periphery of the anterior left temporal lobe measuring 1.9 x 1.5 x 2.4 cm.  There is also adjacent parenchymal edema.  Additional periphery of the inferior right temporal lobe measures 1.0 x 1.5 x 0.5 cm also with adjacent parenchymal edema.  A 4 mm focus of enhancement is present at the periphery of the right temporal lobe.  He had numerous black skin lesions scattered over his body which are enlarging.  CT chest, abdomen and pelvis from August 11th revealed enlarged left axillary and subpectoral lymph nodes measuring up to 2.0 x 1.9 cm.  Multiple tiny bilateral pulmonary  nodules, nonspecific, were also observed, the largest measuring 3 mm.  There was no evidence of metastatic disease in the abdomen or pelvis.  He was seen by Dr. Lilia Bender and underwent biopsy and excision of his back and lip lesions on August 10th.  The pathology from these procedures revealed malignant melanoma, Clark level IV, with peripheral and deep margins involved.  He is positive for BRAF.   He received his first dose of immunotherapy with ipilimumab/nivolumab IV every 3 weeks on September 17th and tolerated this well.  However, he presented to the emergency department later that day due to persistent atrial fibrillation and high fever.  He was placed on medications and started on low-dose anticoagulation.  He was admitted with right lower lobe pneumonia and treated accordingly with cefepime and doxycycline.  He was discharged on September 23rd on Cardizem 30 mg TID, Entresto 24/26 BID, digoxin 0.125 mg daily, metoprolol 50 mg BID, Eliquis 2.5 mg BID, doxycycline 100 mg BID #10, and Farxiga 10 mg daily.  He has had some increase in the skin lesions while on immunotherapy, which we feel was possibly pseudoprogression.  Some of the lesions are decreasing and scabbing over.  CT chest from December 27th revealed interval growth of bulky heterogeneously enhancing left axillary and left retropectoral lymphadenopathy, as well as new left hilar lymphadenopathy.  There is also new subcentimeter pulmonary nodules in the lungs bilaterally, indeterminate for pulmonary metastases, and new small subcutaneous soft tissue nodule in the ventral upper left chest wall, suspicious for subcutaneous metastasis.  Indistinct clustered subcapsular foci of hyperenhancement  in the peripheral inferior right liver lobe, not convincingly changed, indeterminate, was also seen.  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  I cannot rule out  progression of his melanoma, but time will tell.  He was seen on January 10th for pain and swelling of the left axillary nodes.  Since he had leukocytosis and tenderness, he was placed on Keflex.  When I saw him for follow up, he was already improving.  MRI head on January 13th revealed mixed treatment response with slight enlargement of the right inferior temporal lobe lesion now measuring 17 x 11 mm, and new right frontal operculum lesion measuring 2 mm and left frontal leptomeningeal enhancement.  There is interval resolution of the leptomeningeal enhancement the left postcentral sulcus.  A stable 4 mm right temporal lobe lesion and a new left inferior frontal lobe lesion measuring 4 mm were also observed.  He was added onto the schedule on January 24th because of worsening painful left axillary adenopathy.  His white count had increased to 15,000 and he had been having fevers.  He also reports poor appetite with abdominal pain, and mild nausea and vomiting.  He was given a dose of IV rocephin and started back on oral Keflex.  He has been seen by Dr. Eppie Bender and they plan on stereotactic radiation to the two brain areas.  He will have a dedicated MRI first.  An MRI of the abdomen was ordered due to his GI complaints and a possible liver lesion seen on CT.  INTERVAL HISTORY:  He has been added onto the schedule again due to feeling poorly.  He has not seen a difference in his left lymphadenopathy since starting Keflex, so we will discontinue this medication.  He has worsening pain of the left axillary adenopathy, nausea and limited range of motion of the left upper extremity.  He has also had some low grade fever.  As he cannot lift his left arm, they had to cancel his abdominal MRI.  He is currently averaging 2-3 Vicodin daily.  I will send in a prescription for Zofran ODT 4 mg Q4H prn.  He has seen Dr. Eppie Bender to discuss SRS to the brain.  She is obtaining a specialized MRI of the brain.  White  count has increased from 15.5 to 18.1, his platelet count has increased from 365,000 to 404,000, and his hemoglobin is normal.  Chemistries are unremarkable.  His  appetite is good, and he has gained 6 pounds since his last visit.  He denies chills.  He denies bowel issues, or abdominal pain.  He denies sore throat, cough, dyspnea, or chest pain.  REVIEW OF SYSTEMS:  Review of Systems  Constitutional: Positive for appetite change (poor) and fever (low grade).  HENT:  Negative.   Eyes: Negative.   Respiratory: Negative.   Cardiovascular: Negative.   Gastrointestinal: Positive for abdominal pain, nausea (mild) and vomiting (mild).  Endocrine: Negative.   Genitourinary: Negative.    Musculoskeletal: Negative.   Skin: Negative.        Multiple scattered melanoma lesions of the skin  Neurological: Negative.   Hematological: Positive for adenopathy (left axillary, worse and painful).  Psychiatric/Behavioral: Negative.   All other systems reviewed and are negative.   VITALS:  Blood pressure 127/87, pulse (!) 102, temperature 98.1 F (36.7 C), resp. rate 18, height _0  (1.6 m), weight 161 lb 9.6 oz (  73.3 kg), SpO2 94 %.  Wt Readings from Last 3 Encounters:  06/29/20 161 lb 9.6 oz (73.3 kg)  06/28/20 161 lb 4 oz (73.1 kg)  06/26/20 155 lb 8 oz (70.5 kg)    Body mass index is 28.63 kg/m.  Performance status (ECOG): 1 - Symptomatic but completely ambulatory  PHYSICAL EXAM:  Physical Exam Constitutional:      General: He is not in acute distress.    Appearance: Normal appearance. He is normal weight.  HENT:     Head: Normocephalic and atraumatic.  Eyes:     General: No scleral icterus.    Extraocular Movements: Extraocular movements intact.     Conjunctiva/sclera: Conjunctivae normal.     Pupils: Pupils are equal, round, and reactive to light.  Cardiovascular:     Rate and Rhythm: Regular rhythm. Tachycardia present.     Pulses: Normal pulses.     Heart sounds: Normal heart  sounds. No murmur heard. No friction rub. No gallop.   Pulmonary:     Effort: Pulmonary effort is normal. No respiratory distress.     Breath sounds: Normal breath sounds.  Abdominal:     General: Bowel sounds are normal. There is no distension.     Palpations: Abdomen is soft. There is no hepatomegaly, splenomegaly or mass.     Tenderness: There is no abdominal tenderness.  Musculoskeletal:        General: Normal range of motion.     Cervical back: Normal range of motion and neck supple.     Right lower leg: No edema.     Left lower leg: No edema.  Lymphadenopathy:     Cervical: No cervical adenopathy.     Comments: Mass of the left axilla measuring at least 7-8 cm in diameter, largely firm, and tender.  He still has a subcutaneous nodule measuring about 1 cm in the left upper anterior chest.    Skin:    General: Skin is warm and dry.     Comments: Left shoulder incision is healing well.  Skin lesions remain stable.  Skin turgor is mildly decreased.  Neurological:     General: No focal deficit present.     Mental Status: He is alert and oriented to person, place, and time. Mental status is at baseline.  Psychiatric:        Mood and Affect: Mood normal.        Behavior: Behavior normal.        Thought Content: Thought content normal.        Judgment: Judgment normal.     LABS:   CBC Latest Ref Rng & Units 06/26/2020 06/07/2020 05/10/2020  WBC - 15.5 13.5 9.7  Hemoglobin 13.5 - 17.5 16.1 16.1 16.0  Hematocrit 41 - 53 49 48 47  Platelets 150 - 399 365 367 327   CMP Latest Ref Rng & Units 06/26/2020 06/07/2020 05/10/2020  Glucose 70 - 99 mg/dL - - -  BUN 4 - _0 Creatinine 0.6 - 1.3 0.9 0.8 1.0  Sodium 137 - 147 138 139 141  Potassium 3.4 - 5.3 3.8 3.8 3.6  Chloride 99 - 108 105 107 108  CO2 13 - 22 25(A) 23(A) 24(A)  Calcium 8.7 - 10.7 9.4 9.4 9.0  Total Protein 6.5 - 8.1 g/dL - - -  Total Bilirubin 0.3 - 1.2 mg/dL - - -  Alkaline Phos 25 - 125 157(A) 147(A) 132(A)  AST  14 - 40 33 38 29  ALT 10 - 40 20 33 26     STUDIES:   No current studies  HISTORY:   Allergies: No Known Allergies  Current Medications: Current Outpatient Medications  Medication Sig Dispense Refill  . benzonatate (TESSALON) 100 MG capsule Take 1 capsule by mouth three times daily as needed for cough 90 capsule 0  . cephALEXin (KEFLEX) 500 MG capsule Take 1 capsule (500 mg total) by mouth 3 (three) times daily. 42 capsule 0  . digoxin (LANOXIN) 0.125 MG tablet Take 1 tablet (125 mcg total) by mouth daily. 30 tablet 5  . diltiazem (CARDIZEM) 30 MG tablet Take 1 tablet (30 mg total) by mouth 2 (two) times daily. 180 tablet 3  . ELIQUIS 2.5 MG TABS tablet Take 1 tablet (2.5 mg total) by mouth 2 (two) times daily. 60 tablet 2  . empagliflozin (JARDIANCE) 10 MG TABS tablet Take 1 tablet (10 mg total) by mouth daily before breakfast. 30 tablet 6  . ENTRESTO 24-26 MG Take 1 tablet by mouth 2 (two) times daily.    . furosemide (LASIX) 20 MG tablet Take 1 tablet (20 mg total) by mouth daily. 90 tablet 3  . HYDROcodone-acetaminophen (NORCO/VICODIN) 5-325 MG tablet Take 1 tablet by mouth every 4 (four) hours as needed. for pain 120 tablet 0  . metoprolol tartrate (LOPRESSOR) 50 MG tablet Take 1 tablet (50 mg total) by mouth 2 (two) times daily. 60 tablet 5  . ondansetron (ZOFRAN) 4 MG tablet Take 4 mg by mouth every 8 (eight) hours as needed for nausea or vomiting.    Marland Kitchen oxyCODONE (OXY IR/ROXICODONE) 5 MG immediate release tablet Take 5 mg by mouth every 8 (eight) hours as needed.    . potassium chloride SA (KLOR-CON) 20 MEQ tablet Take 1 tablet (20 mEq total) by mouth daily. 90 tablet 3  . prochlorperazine (COMPAZINE) 10 MG tablet Take 10 mg by mouth every 6 (six) hours as needed for nausea or vomiting.     No current facility-administered medications for this visit.     ASSESSMENT & PLAN:   Assessment/Plan: 1. History of melanoma of the right upper abdominal wall in 2011, stage IA. This  was treated with resection/wide excision with Dr. Michele Bender.  This area remains without evidence of recurrence.    2.  Enhancement of the bilateral temporal lobes as seen on MRI imaging.  Most recent scan from January revealed enlarged right inferior temporal lobe lesion now measuring 17 x 11 mm, and new right frontal operculum lesion measuring 2 mm and left frontal leptomeningeal enhancement.  There is interval resolution of the leptomeningeal enhancement the left postcentral sulcus.  We feel this is metastatic melanoma and he has some mild nausea associated with this now.  He has been off corticosteroids for several months.    3.  Enlarged left axillary and subpectoral metastatic lymph nodes measuring up to 2.7 cm.  These became more inflamed with a leukocytosis, so I treated him with Keflex and he initially improved.  However, the adenopathy has now worsened, and is painful and more firm.  I feel there is definitely melanoma there but he may have superimposed infection with the worsening leukocytosis and low grade fever.    I will therefore switch him to Levaquin 500 mg daily for 10 days.  He has been using Vicodin for pain, and I will refill this today.   4.  Innumerable dark and pedunculated moles all over the body, which have worsened over the past few months, and  clearly appear consistent with metastatic melanoma.  The largest measures about 3 cm of the middle back.  He has undergone biopsy and excision of two of these. We have stopped immunotherapy with nivolumab, and plan to pursue BRAF mutation targeted therapyl.  We have been hoping this represented pseudoprogression, but at this time we feel it is more consistent with disease progression.     5.  Multiple tiny bilateral pulmonary nodules, nonspecific.  These are likely benign, but could represent early metastatic disease.  We will continue to monitor.    6.  Positive BRAF mutation.  We will plan to switch him to oral therapy with trametinib and  dabrafenib.  7. Atrial fibrillation.  He is on medications and low dose anticoagulation and follows with Dr. Bettina Gavia.  Cardiomyopathy.  He has been started on Entresto.    8. Right lower lobe pneumonia, resolved.   9.  Mediastinal and left axillary adenopathy with pulmonary nodules which could represent a sarcoidosis-like syndrome, but I cannot rule out progressive melanoma.  10.  Abdominal pain, decreased appetite, and occasional nausea and vomiting.  As he is symptomatic we will order MRI for further evaluation, as there was a small area of hyperenhancement in the liver on CT imaging.  Plan: The patient has not seen a difference in the left axillary lymph nodes which are now more painful since starting Keflex.  We will therefore switch him to Levaquin 500 mg daily for 10 days.  He has also been having symptoms of abdominal pain, decreased appetite, and worsening nausea and vomiting.  We will send him to the [facility] for 1 L of IV normal saline, 8 mg of IV Zofran and 10 mg of IV Decadron today.  I will also send in a prescription for Zofran ODT 4 mg Q4H prn for nausea. He was only given 5 days worth of Vicodin as it had been so long since we had last filled the prescription.  Therefore, I will send this in again today.  The MRI abdomen had to be rescheduled due to his symptoms.  He has met with Dr. Isidore Moos to discuss SRS to the brain and they are planning to proceed with this.  I agree that he would benefit from radiation to the left axilla.  He is in the process of being switched to oral therapy with trametinib and dabrafenib as he is positive for the BRAF mutation.  We have him keep his appointment in February as scheduled.  The patient and his wife understand the plans discussed today and are in agreement with them.  They know to contact our office if his develops concerns prior to his next appointment.   I provided 30 minutes of face-to-face time during this this encounter and > 50% was spent  counseling as documented under my assessment and plan.    Adam Kaplan, MD [facility] AT [facility] [address] [place] [address] Dept: [phone] Dept Fax: [phone]     I, Rita Ohara, am acting as scribe for Adam Kaplan, MD  I have reviewed this report as typed by the medical scribe, and it is complete and accurate.

## 2020-06-30 ENCOUNTER — Telehealth: Payer: Self-pay

## 2020-06-30 ENCOUNTER — Other Ambulatory Visit: Payer: Self-pay

## 2020-06-30 ENCOUNTER — Telehealth: Payer: Self-pay | Admitting: Oncology

## 2020-06-30 ENCOUNTER — Other Ambulatory Visit: Payer: Self-pay | Admitting: Oncology

## 2020-06-30 ENCOUNTER — Encounter: Payer: Self-pay | Admitting: Oncology

## 2020-06-30 ENCOUNTER — Inpatient Hospital Stay (INDEPENDENT_AMBULATORY_CARE_PROVIDER_SITE_OTHER): Payer: 59 | Admitting: Oncology

## 2020-06-30 VITALS — BP 121/81 | HR 102 | Temp 98.2°F | Resp 18 | Ht 63.0 in | Wt 162.6 lb

## 2020-06-30 DIAGNOSIS — C4359 Malignant melanoma of other part of trunk: Secondary | ICD-10-CM | POA: Diagnosis not present

## 2020-06-30 DIAGNOSIS — M62838 Other muscle spasm: Secondary | ICD-10-CM

## 2020-06-30 MED ORDER — CYCLOBENZAPRINE HCL 10 MG PO TABS: 10.0000 mg | ORAL_TABLET | Freq: Three times a day (TID) | ORAL | 5 refills | Status: DC | PRN

## 2020-06-30 NOTE — Telephone Encounter (Addendum)
Earlier, I notified [name], pt's wife, that Dr Hinton Rao would like to see him @ 12 noon. I also told [name], that they may still be referred to the ED if Dr Hinton Rao feels it is warranted. She verbalized understanding.   ----- Message from Derwood Kaplan, MD sent at 06/30/2020  8:37 AM EST ----- Regarding: RE: Rt thigh cramps, now rt foot cold Can we sched U/S right leg, venous and arterial ASAP?   If we are unable to get today, would need to send to ER.  I don't think Kelli or I can see him today  I had only planned the 1 dose of steroids, hesitant to put on too much when he seems to have an infection.  Tell them Dr.Squires sent me note back that they will arrange XRT to the left axilla also when he goes ----- Message ----- From: Dairl Ponder, RN Sent: 06/30/2020   8:34 AM EST To: Derwood Kaplan, MD Subject: Rt thigh cramps, now rt foot cold              Pt's wife, [name], LVM on triage line @ 819a, to report 2 things. First, he feels better since steroids yesterday.  Second, he developed cramps in his right thigh about 5p-6p. During the night it has progressed to his right foot, & his foot is now cold.  I called her back to get a little more information. I asked if the right foot is cooler than left foot. She replied "yes". I asked if there is a color change/blueness to the right foot. She replied, "no".  [phone]  She also wanted to ask if Dr Hinton Rao planned to give him steroids by mouth or just IV yesterday?   I called Dr Hinton Rao as soon as I got off phone with pt's wife, to inform her of above. She will order doppler/US to be done asap. She will discuss with Langley Gauss, scheduler.

## 2020-06-30 NOTE — Telephone Encounter (Signed)
Called patient and talked to wife [name]. Informed them that the ultrasound results were negative for blood clot. That Dr. Hinton Rao will be calling in a muscle relaxer. Wife voiced her understanding/

## 2020-06-30 NOTE — Progress Notes (Signed)
[facility]  [address] [place],  [place]  [address] [phone]  Clinic Day:  06/30/2020  Referring physician: Derwood Kaplan, MD   CHIEF COMPLAINT:  CC: Left axillary pain and increasing melanoma skin lesions.  Current Treatment:  Will plan to switch him to BRAF targeted oral therapy and radiation of the brain   HISTORY OF PRESENT ILLNESS:  Adam Bender. is a 60 y.o. 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 Michele Mcalpine.  He had several areas of suspicious skin lesions develop, which seem to have worsened.  He had not been following with dermatology routinely.  He presented to the [facility] [place] Emergency Department on August 4th due to a 2 month history of headaches.  These occur 3-4 times per week, usually of the right temporal area, but occasionally the left temporal as well.  The headaches are associated with vertigo upon standing or when looking down, nausea and fatigue.  He denied any visual acuity changes, numbness or tingling.  CT head imaging revealed subcortical white matter edema involving the left temporal lobe concerning for infarction or underlying neoplasm.  Correlating MRI head confirmed homogeneous enhancement at the periphery of the anterior left temporal lobe measuring 1.9 x 1.5 x 2.4 cm.  There is also adjacent parenchymal edema.  Additional periphery of the inferior right temporal lobe measures 1.0 x 1.5 x 0.5 cm also with adjacent parenchymal edema.  A 4 mm focus of enhancement is present at the periphery of the right temporal lobe.  He had numerous black skin lesions scattered over his body which are enlarging.  CT chest, abdomen and pelvis from August 11th revealed enlarged left axillary and subpectoral lymph nodes measuring up to 2.0 x 1.9 cm.  Multiple tiny bilateral pulmonary  nodules, nonspecific, were also observed, the largest measuring 3 mm.  There was no evidence of metastatic disease in the abdomen or pelvis.  He was seen by Dr. Lilia Pro and underwent biopsy and excision of his back and lip lesions on August 10th.  The pathology from these procedures revealed malignant melanoma, Clark level IV, with peripheral and deep margins involved.  He is positive for BRAF.   He received his first dose of immunotherapy with ipilimumab/nivolumab IV every 3 weeks on September 17th and tolerated this well.  However, he presented to the emergency department later that day due to persistent atrial fibrillation and high fever.  He was placed on medications and started on low-dose anticoagulation.  He was admitted with right lower lobe pneumonia and treated accordingly with cefepime and doxycycline.  He was discharged on September 23rd on Cardizem 30 mg TID, Entresto 24/26 BID, digoxin 0.125 mg daily, metoprolol 50 mg BID, Eliquis 2.5 mg BID, doxycycline 100 mg BID #10, and Farxiga 10 mg daily.  He has had some increase in the skin lesions while on immunotherapy, which we feel was possibly pseudoprogression.  Some of the lesions are decreasing and scabbing over.  CT chest from December 27th revealed interval growth of bulky heterogeneously enhancing left axillary and left retropectoral lymphadenopathy, as well as new left hilar lymphadenopathy.  There is also new subcentimeter pulmonary nodules in the lungs bilaterally, indeterminate for pulmonary metastases, and new small subcutaneous soft tissue nodule in the ventral upper left chest wall, suspicious for subcutaneous metastasis.  Indistinct clustered subcapsular foci of hyperenhancement  in the peripheral inferior right liver lobe, not convincingly changed, indeterminate, was also seen.  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  I cannot rule out  progression of his melanoma, but time will tell.  He was seen on January 10th for pain and swelling of the left axillary nodes.  Since he had leukocytosis and tenderness, he was placed on Keflex.  When I saw him for follow up, he was already improving.  MRI head on January 13th revealed mixed treatment response with slight enlargement of the right inferior temporal lobe lesion now measuring 17 x 11 mm, and new right frontal operculum lesion measuring 2 mm and left frontal leptomeningeal enhancement.  There is interval resolution of the leptomeningeal enhancement the left postcentral sulcus.    INTERVAL HISTORY:  He has been added onto the schedule yet again due to right thigh pain/cramping with associated cool skin.  He was just seen on Monday with worsening pain and swelling of the left axilla associated with low grade fever and leukocytosis.  We changed his antibiotic but I think this represents a combination of melanoma and superimposed infection.  We ordered an MRI of the abdomen to evaluate the possible liver lesion, but he was unable to do that due to inability to raise his arms.  Dr. Isidore Moos is planning to radiate the left axilla when she sees him for the stereotactic radiation of the brain.  He states that the leg cramping started yesterday, and it is painful to bare weight on it.  He is currently on Eliquis 5 mg BID.  We have scheduled him for Doppler ultrasound this afternoon and I will call them with the results.  The left lymphadenopathy is mildly improved and he is able to raise his left arm more.  However, this is still painful and quite swollen.  He continues antibiotic as prescribed.  His  appetite is good, and his weight is stable since his last visit.  He denies fever, chills or other signs of infection.  He denies nausea, vomiting, bowel issues, or abdominal pain.  He denies sore throat, cough, dyspnea, hemoptysis or chest pain.  REVIEW OF SYSTEMS:  Review of Systems  HENT:  Negative.    Eyes: Negative.   Respiratory: Negative.   Cardiovascular: Negative.   Endocrine: Negative.   Genitourinary: Negative.    Musculoskeletal:       Pain/cramping of the right thigh  Skin: Negative.        Multiple scattered melanoma lesions of the skin  Neurological: Negative.   Hematological: Positive for adenopathy (left axillary, worse and painful).  Psychiatric/Behavioral: Negative.   All other systems reviewed and are negative.   VITALS:  Blood pressure 121/81, pulse (!) 102, temperature 98.2 F (36.8 C), temperature source Oral, resp. rate 18, height '5\' 3"'  (1.6 m), weight 162 lb 9.6 oz (73.8 kg), SpO2 96 %.  Wt Readings from Last 3 Encounters:  06/30/20 162 lb 9.6 oz (73.8 kg)  06/29/20 162 lb (73.5 kg)  06/29/20 161 lb 9.6 oz (73.3 kg)    Body mass index is 28.8 kg/m.  Performance status (ECOG): 1 - Symptomatic but completely ambulatory  PHYSICAL EXAM:  Physical Exam Constitutional:      General: He is not in acute distress.    Appearance: Normal appearance. He is normal weight.  HENT:     Head: Normocephalic and atraumatic.  Eyes:  General: No scleral icterus.    Extraocular Movements: Extraocular movements intact.     Conjunctiva/sclera: Conjunctivae normal.     Pupils: Pupils are equal, round, and reactive to light.  Cardiovascular:     Rate and Rhythm: Regular rhythm. Tachycardia present.     Pulses: Normal pulses.     Heart sounds: Normal heart sounds. No murmur heard. No friction rub. No gallop.   Pulmonary:     Effort: Pulmonary effort is normal. No respiratory distress.     Breath sounds: Normal breath sounds.  Abdominal:     General: Bowel sounds are normal. There is no distension.     Palpations: Abdomen is soft. There is no hepatomegaly, splenomegaly or mass.     Tenderness: There is no abdominal tenderness.  Musculoskeletal:        General: Normal range of motion.     Cervical back: Normal range of motion and neck supple.     Right lower  leg: No edema.     Left lower leg: No edema.     Comments: Firmness of the tendons of the bilateral thighs  Lymphadenopathy:     Cervical: No cervical adenopathy.     Comments: Mass of the left axilla measuring at least 7-8 cm across, which is not as firm.  He still has a subcutaneous nodule measuring about 1 cm in the left upper anterior chest.    Skin:    General: Skin is warm and dry.     Comments: Left shoulder incision is healing well.  Skin lesions remain stable.  Neurological:     General: No focal deficit present.     Mental Status: He is alert and oriented to person, place, and time. Mental status is at baseline.  Psychiatric:        Mood and Affect: Mood normal.        Behavior: Behavior normal.        Thought Content: Thought content normal.        Judgment: Judgment normal.     LABS:   CBC Latest Ref Rng & Units 06/29/2020 06/26/2020 06/07/2020  WBC - 18.1 15.5 13.5  Hemoglobin 13.5 - 17.5 16.0 16.1 16.1  Hematocrit 41 - 53 49 49 48  Platelets 150 - 399 404(A) 365 367   CMP Latest Ref Rng & Units 06/29/2020 06/26/2020 06/07/2020  Glucose 70 - 99 mg/dL - - -  BUN 4 - '21 9 9 8  ' Creatinine 0.6 - 1.3 0.9 0.9 0.8  Sodium 137 - 147 138 138 139  Potassium 3.4 - 5.3 3.8 3.8 3.8  Chloride 99 - 108 102 105 107  CO2 13 - 22 30(A) 25(A) 23(A)  Calcium 8.7 - 10.7 9.6 9.4 9.4  Total Protein 6.5 - 8.1 g/dL - - -  Total Bilirubin 0.3 - 1.2 mg/dL - - -  Alkaline Phos 25 - 125 162(A) 157(A) 147(A)  AST 14 - 40 30 33 38  ALT 10 - 40 19 20 33     STUDIES:   No current studies  HISTORY:   Allergies: No Known Allergies  Current Medications: Current Outpatient Medications  Medication Sig Dispense Refill  . benzonatate (TESSALON) 100 MG capsule Take 1 capsule by mouth three times daily as needed for cough 90 capsule 0  . digoxin (LANOXIN) 0.125 MG tablet Take 1 tablet (125 mcg total) by mouth daily. 30 tablet 5  . diltiazem (CARDIZEM) 30 MG tablet Take 1 tablet (30 mg total) by  mouth  2 (two) times daily. 180 tablet 3  . ELIQUIS 2.5 MG TABS tablet Take 1 tablet (2.5 mg total) by mouth 2 (two) times daily. 60 tablet 2  . empagliflozin (JARDIANCE) 10 MG TABS tablet Take 1 tablet (10 mg total) by mouth daily before breakfast. 30 tablet 6  . ENTRESTO 24-26 MG Take 1 tablet by mouth 2 (two) times daily.    . furosemide (LASIX) 20 MG tablet Take 1 tablet (20 mg total) by mouth daily. 90 tablet 3  . HYDROcodone-acetaminophen (NORCO/VICODIN) 5-325 MG tablet Take 1 tablet by mouth every 4 (four) hours as needed. for pain 120 tablet 0  . levofloxacin (LEVAQUIN) 500 MG tablet Take 1 tablet (500 mg total) by mouth daily for 10 days. 10 tablet 0  . metoprolol tartrate (LOPRESSOR) 50 MG tablet Take 1 tablet (50 mg total) by mouth 2 (two) times daily. 60 tablet 5  . ondansetron (ZOFRAN ODT) 4 MG disintegrating tablet Take 1 tablet (4 mg total) by mouth every 4 (four) hours as needed for nausea or vomiting. 30 tablet 5  . ondansetron (ZOFRAN) 4 MG tablet Take 4 mg by mouth every 8 (eight) hours as needed for nausea or vomiting.    Marland Kitchen oxyCODONE (OXY IR/ROXICODONE) 5 MG immediate release tablet Take 5 mg by mouth every 8 (eight) hours as needed.    . potassium chloride SA (KLOR-CON) 20 MEQ tablet Take 1 tablet (20 mEq total) by mouth daily. 90 tablet 3  . prochlorperazine (COMPAZINE) 10 MG tablet Take 10 mg by mouth every 6 (six) hours as needed for nausea or vomiting.     No current facility-administered medications for this visit.     ASSESSMENT & PLAN:   Assessment/Plan: 1. History of melanoma of the right upper abdominal wall in 2011, stage IA. This was treated with resection/wide excision with Dr. Michele Mcalpine.  This area remains without evidence of recurrence.    2.  Enhancement of the bilateral temporal lobes as seen on MRI imaging.  Most recent scan from January revealed enlarged right inferior temporal lobe lesion now measuring 17 x 11 mm, and new right frontal operculum lesion  measuring 2 mm and left frontal leptomeningeal enhancement.  There is interval resolution of the leptomeningeal enhancement the left postcentral sulcus.  We feel this is metastatic melanoma and he has some mild nausea associated with this now.  He has been off corticosteroids for several months.    3.  Enlarged left axillary and subpectoral metastatic lymph nodes measuring up to 2.7 cm.  These became more inflamed with a leukocytosis, so I treated him with Keflex and he initially improved.  However, the adenopathy has now worsened, and is painful.  I will therefore changed him to Levaquin 3 days ago, and we do see some improvement.  He has been using ,more Vicodin for pain.   4.  Innumerable dark and pedunculated moles all over the body, which have worsened over the past few months, and clearly appear consistent with metastatic melanoma.  The largest measures about 3 cm of the middle back.  He has undergone biopsy and excision of two of these. We have stopped immunotherapy with nivolumab, and plan to pursue BRAF mutation targeted therapyl.  We have been hoping this represented pseudoprogression, but at this time we feel it is more consistent with disease progression.     5.  Multiple tiny bilateral pulmonary nodules, nonspecific.  These are likely benign, but could represent early metastatic disease.  We will continue to  monitor.    6.  Positive BRAF mutation.  We will plan to switch him to oral therapy with trametinib and dabrafenib.  7. Atrial fibrillation.  This is more irregular and rapid today.  He is on medications and low dose anticoagulation and follows with Dr. Bettina Gavia.  Cardiomyopathy.  He has been started on Entresto.    8. Right lower lobe pneumonia, resolved.   9.  Mediastinal and left axillary adenopathy with pulmonary nodules which could represent a sarcoidosis-like syndrome, but I cannot rule out progressive melanoma.  10.  We have ordered an MRI for further evaluation of a  questionable liver lesion seen on CT imaging with hyperenhancement.  11.  New leg pain, right greater than left associated with cramping.  His legs are warm.  I really do not feel any temperature change and he has good pulses of the feet.  Plan: I feel that his leg pain is more related to muscle spasms than thrombosis, however, we have scheduled him for Doppler ultrasound this afternoon.  He is currently on Eliquis 5 mg BID.  He is in the process of being switched to oral therapy with trametinib and dabrafenib as he is positive for the BRAF mutation.  He has met with Dr. Isidore Moos [facility] and they are planning on palliative radiation therapy. We have him keep his appointment in February as scheduled.  The patient and his wife understand the plans discussed today and are in agreement with them.  They know to contact our office if his develops concerns prior to his next appointment.   I provided 30 minutes of face-to-face time during this this encounter and > 50% was spent counseling as documented under my assessment and plan.    Derwood Kaplan, MD [facility] AT [facility] [address] [place] [address] Dept: [phone] Dept Fax: [phone]     I, Rita Ohara, am acting as scribe for Derwood Kaplan, MD  I have reviewed this report as typed by the medical scribe, and it is complete and accurate.

## 2020-06-30 NOTE — Telephone Encounter (Signed)
1/28: Per Levada Dy, enter patient a Follow Up with Dr Hinton Rao at [address] today

## 2020-07-03 ENCOUNTER — Other Ambulatory Visit: Payer: Self-pay | Admitting: Hematology and Oncology

## 2020-07-03 ENCOUNTER — Other Ambulatory Visit: Payer: Self-pay

## 2020-07-03 ENCOUNTER — Telehealth: Payer: Self-pay

## 2020-07-03 ENCOUNTER — Inpatient Hospital Stay: Payer: 59

## 2020-07-03 VITALS — BP 131/86 | HR 78 | Temp 99.1°F | Resp 18 | Ht 63.0 in | Wt 162.5 lb

## 2020-07-03 DIAGNOSIS — Z5112 Encounter for antineoplastic immunotherapy: Secondary | ICD-10-CM | POA: Diagnosis not present

## 2020-07-03 DIAGNOSIS — R112 Nausea with vomiting, unspecified: Secondary | ICD-10-CM

## 2020-07-03 MED ORDER — ONDANSETRON HCL 4 MG/2ML IJ SOLN
8.0000 mg | Freq: Once | INTRAMUSCULAR | Status: AC
  Administered 2020-07-03: 8 mg via INTRAVENOUS

## 2020-07-03 MED ORDER — DEXAMETHASONE 4 MG PO TABS: 4.0000 mg | ORAL_TABLET | Freq: Two times a day (BID) | ORAL | 0 refills | Status: DC

## 2020-07-03 MED ORDER — HYDROMORPHONE HCL 2 MG PO TABS: 2.0000 mg | ORAL_TABLET | ORAL | 0 refills | Status: DC | PRN

## 2020-07-03 MED ORDER — DEXAMETHASONE SODIUM PHOSPHATE 10 MG/ML IJ SOLN
10.0000 mg | Freq: Once | INTRAMUSCULAR | Status: AC
  Administered 2020-07-03: 10 mg via INTRAVENOUS

## 2020-07-03 MED ORDER — HEPARIN SOD (PORK) LOCK FLUSH 100 UNIT/ML IV SOLN
500.0000 [IU] | Freq: Once | INTRAVENOUS | Status: AC | PRN
  Administered 2020-07-03: 500 [IU]
  Filled 2020-07-03: qty 5

## 2020-07-03 MED ORDER — DEXAMETHASONE SODIUM PHOSPHATE 10 MG/ML IJ SOLN
INTRAMUSCULAR | Status: AC
  Filled 2020-07-03: qty 1

## 2020-07-03 MED ORDER — ONDANSETRON HCL 4 MG/2ML IJ SOLN
8.0000 mg | Freq: Once | INTRAMUSCULAR | Status: AC
Start: 2020-07-03 — End: 2020-07-03
  Administered 2020-07-03: 8 mg via INTRAVENOUS

## 2020-07-03 MED ORDER — SODIUM CHLORIDE 0.9 % IV SOLN
Freq: Once | INTRAVENOUS | Status: AC
  Filled 2020-07-03: qty 250

## 2020-07-03 MED ORDER — ONDANSETRON HCL 4 MG/2ML IJ SOLN
INTRAMUSCULAR | Status: AC
  Filled 2020-07-03: qty 8

## 2020-07-03 NOTE — Patient Instructions (Signed)
Nausea and Vomiting, Adult Nausea is feeling sick to your stomach or feeling that you are about to throw up (vomit). Vomiting is when food in your stomach is thrown up and out of the mouth. Throwing up can make you feel weak. It can also make you lose too much water in your body (get dehydrated). If you lose too much water in your body, you may:  Feel tired.  Feel thirsty.  Have a dry mouth.  Have cracked lips.  Go pee (urinate) less often. Older adults and people with other diseases or a weak body defense system (immune system) are at higher risk for losing too much water in the body. If you feel sick to your stomach and you throw up, it is important to follow instructions from your doctor about how to take care of yourself. Follow these instructions at home: Watch your symptoms for any changes. Tell your doctor about them. Follow these instructions to care for yourself at home. Eating and drinking  Take an ORS (oral rehydration solution). This is a drink that is sold at pharmacies and stores.  Drink clear fluids in small amounts as you are able, such as: ? Water. ? Ice chips. ? Fruit juice that has water added (diluted fruit juice). ? Low-calorie sports drinks.  Eat bland, easy-to-digest foods in small amounts as you are able, such as: ? Bananas. ? Applesauce. ? Rice. ? Low-fat (lean) meats. ? Toast. ? Crackers.  Avoid drinking fluids that have a lot of sugar or caffeine in them. This includes energy drinks, sports drinks, and soda.  Avoid alcohol.  Avoid spicy or fatty foods.      General instructions  Take over-the-counter and prescription medicines only as told by your doctor.  Drink enough fluid to keep your pee (urine) pale yellow.  Wash your hands often with soap and water. If you cannot use soap and water, use hand sanitizer.  Make sure that all people in your home wash their hands well and often.  Rest at home while you get better.  Watch your condition  for any changes.  Take slow and deep breaths when you feel sick to your stomach.  Keep all follow-up visits as told by your doctor. This is important. Contact a doctor if:  Your symptoms get worse.  You have new symptoms.  You have a fever.  You cannot drink fluids without throwing up.  You feel sick to your stomach for more than 2 days.  You feel light-headed or dizzy.  You have a headache.  You have muscle cramps.  You have a rash.  You have pain while peeing. Get help right away if:  You have pain in your chest, neck, arm, or jaw.  You feel very weak or you pass out (faint).  You throw up again and again.  You have throw up that is bright red or looks like black coffee grounds.  You have bloody or black poop (stools) or poop that looks like tar.  You have a very bad headache, a stiff neck, or both.  You have very bad pain, cramping, or bloating in your belly (abdomen).  You have trouble breathing.  You are breathing very quickly.  Your heart is beating very quickly.  Your skin feels cold and clammy.  You feel confused.  You have signs of losing too much water in your body, such as: ? Dark pee, very little pee, or no pee. ? Cracked lips. ? Dry mouth. ? Sunken eyes. ?   Sleepiness. ? Weakness. These symptoms may be an emergency. Do not wait to see if the symptoms will go away. Get medical help right away. Call your local emergency services (911 in the [place] Do not drive yourself to the hospital. Summary  Nausea is feeling sick to your stomach or feeling that you are about to throw up (vomit). Vomiting is when food in your stomach is thrown up and out of the mouth.  Follow instructions from your doctor about eating and drinking to keep from losing too much water in your body.  Take over-the-counter and prescription medicines only as told by your doctor.  Contact your doctor if your symptoms get worse or you have new symptoms.  Keep all follow-up  visits as told by your doctor. This is important. This information is not intended to replace advice given to you by your health care provider. Make sure you discuss any questions you have with your health care provider. Document Revised: 09/11/2018 Document Reviewed: 10/28/2017 Elsevier Patient Education  2021 Elsevier Inc.  

## 2020-07-03 NOTE — Telephone Encounter (Addendum)
I spoke with pt's wife, notified her that scripts have been sent in.  ----- Message from Marvia Pickles, PA-C sent at 07/03/2020  1:40 PM EST ----- Regarding: RE: Req increase in pain meds, & steroids Dexamethasone and dilaudid sent in ----- Message ----- From: Derwood Kaplan, MD Sent: 07/03/2020   1:08 PM EST To: Melodye Ped, NP, Shateka Petrea Cherylann Banas, RN, # Subject: RE: Req increase in pain meds, & steroids      Yes, I called her and we will resume dex 67m bid. Will order Dilaudid 281mto use q 4 hr prn.  We are bringing him in to infusion this pm for IV fluids, DEX and antiemetics.  I have asked KeVida Rollero see him tomorrow.  If Dilaudid too strong, we can go to Vicodin 10, he can't tolerate oxycodone. Rad onc is moving up his program and plans to radiate left axilla in additon to brain.  But rapid decline is worrisome that disease is escalating quickly, we are waiting to get the BRAF drugs.  He is already sched to see me later this week ----- Message ----- From: CaDairl PonderRN Sent: 07/03/2020  12:41 PM EST To: ChDerwood KaplanMD Subject: Req increase in pain meds, & steroids          Pt's wife, LaMickel Baasjust called back @ 1230. She went home to check on Brandol. "He is still vomiting and in a lot of pain. He and I want to know if Dr McHinton Raoill give him Vicodin 10 and steroids to take the inflammation down in his arm to get the mapping for radiation done"? [phone]

## 2020-07-03 NOTE — Addendum Note (Signed)
Addended by: Juanetta Beets on: 07/03/2020 01:14 PM   Modules accepted: Orders

## 2020-07-04 ENCOUNTER — Ambulatory Visit: Payer: 59

## 2020-07-04 ENCOUNTER — Ambulatory Visit: Payer: 59 | Admitting: Hematology and Oncology

## 2020-07-05 ENCOUNTER — Ambulatory Visit: Payer: 59 | Admitting: Oncology

## 2020-07-05 ENCOUNTER — Other Ambulatory Visit: Payer: 59

## 2020-07-05 ENCOUNTER — Inpatient Hospital Stay: Payer: 59

## 2020-07-05 ENCOUNTER — Inpatient Hospital Stay: Payer: 59 | Admitting: Oncology

## 2020-07-06 ENCOUNTER — Ambulatory Visit: Payer: 59 | Admitting: Oncology

## 2020-07-06 ENCOUNTER — Inpatient Hospital Stay: Payer: 59

## 2020-07-06 ENCOUNTER — Other Ambulatory Visit: Payer: 59

## 2020-07-06 ENCOUNTER — Inpatient Hospital Stay: Payer: 59 | Admitting: Oncology

## 2020-07-07 ENCOUNTER — Ambulatory Visit: Payer: 59 | Admitting: Radiation Oncology

## 2020-07-07 ENCOUNTER — Ambulatory Visit: Payer: Self-pay

## 2020-07-07 ENCOUNTER — Ambulatory Visit: Payer: 59

## 2020-07-07 NOTE — Progress Notes (Signed)
Patient will get his Adam Bender and Adam Bender today from [employer], $0 co-pay with [employer] co-pay card.

## 2020-07-13 ENCOUNTER — Telehealth: Payer: Self-pay | Admitting: Radiation Therapy

## 2020-07-13 NOTE — Telephone Encounter (Signed)
Adam Bender is in good spirits and dealing with her husband's situation as well as she can. She is not happy with the fact that [place] is [place] away. She really would like to have him closer to home and is looking forward to the day that he is stable enough for discharge from [facility]. Per Adam Bender is scheduled to begin radiation to his Lt axilla on Friday 2/11. She is hopeful that this will help with the severe pain he has been experiencing. There are still no plans for discharge at this time due to the severity of his clotting issue and then inability to close the inguinal surgical sites. Adam Bender will keep [place] posted on Adam Bender's progress and when he is stable enough for discharge.    Mont Dutton R.T (R)(T) Radiation Special Procedures Navigator

## 2020-07-14 ENCOUNTER — Ambulatory Visit: Payer: 59 | Admitting: Radiation Oncology

## 2020-07-19 ENCOUNTER — Inpatient Hospital Stay: Payer: 59

## 2020-07-22 ENCOUNTER — Emergency Department (HOSPITAL_COMMUNITY): Payer: 59

## 2020-07-22 ENCOUNTER — Inpatient Hospital Stay (HOSPITAL_COMMUNITY)
Admission: EM | Admit: 2020-07-22 | Discharge: 2020-07-30 | DRG: 037 | Disposition: A | Discharge status: Home Health | Payer: 59 | Attending: Family Medicine | Admitting: Family Medicine

## 2020-07-22 ENCOUNTER — Other Ambulatory Visit: Payer: Self-pay

## 2020-07-22 DIAGNOSIS — G936 Cerebral edema: Secondary | ICD-10-CM | POA: Diagnosis present

## 2020-07-22 DIAGNOSIS — I639 Cerebral infarction, unspecified: Secondary | ICD-10-CM

## 2020-07-22 DIAGNOSIS — Z79899 Other long term (current) drug therapy: Secondary | ICD-10-CM

## 2020-07-22 DIAGNOSIS — I82210 Acute embolism and thrombosis of superior vena cava: Secondary | ICD-10-CM | POA: Diagnosis present

## 2020-07-22 DIAGNOSIS — Z7982 Long term (current) use of aspirin: Secondary | ICD-10-CM

## 2020-07-22 DIAGNOSIS — R739 Hyperglycemia, unspecified: Secondary | ICD-10-CM | POA: Diagnosis not present

## 2020-07-22 DIAGNOSIS — I482 Chronic atrial fibrillation, unspecified: Secondary | ICD-10-CM | POA: Diagnosis present

## 2020-07-22 DIAGNOSIS — Z8249 Family history of ischemic heart disease and other diseases of the circulatory system: Secondary | ICD-10-CM

## 2020-07-22 DIAGNOSIS — H53462 Homonymous bilateral field defects, left side: Secondary | ICD-10-CM | POA: Diagnosis present

## 2020-07-22 DIAGNOSIS — K5900 Constipation, unspecified: Secondary | ICD-10-CM | POA: Diagnosis present

## 2020-07-22 DIAGNOSIS — I110 Hypertensive heart disease with heart failure: Secondary | ICD-10-CM | POA: Diagnosis present

## 2020-07-22 DIAGNOSIS — Z7901 Long term (current) use of anticoagulants: Secondary | ICD-10-CM

## 2020-07-22 DIAGNOSIS — I63411 Cerebral infarction due to embolism of right middle cerebral artery: Principal | ICD-10-CM | POA: Diagnosis present

## 2020-07-22 DIAGNOSIS — I745 Embolism and thrombosis of iliac artery: Secondary | ICD-10-CM | POA: Diagnosis present

## 2020-07-22 DIAGNOSIS — R4781 Slurred speech: Secondary | ICD-10-CM | POA: Diagnosis present

## 2020-07-22 DIAGNOSIS — R400 Somnolence: Secondary | ICD-10-CM | POA: Diagnosis present

## 2020-07-22 DIAGNOSIS — R4701 Aphasia: Secondary | ICD-10-CM | POA: Diagnosis present

## 2020-07-22 DIAGNOSIS — Q211 Atrial septal defect: Secondary | ICD-10-CM

## 2020-07-22 DIAGNOSIS — T380X5A Adverse effect of glucocorticoids and synthetic analogues, initial encounter: Secondary | ICD-10-CM | POA: Diagnosis not present

## 2020-07-22 DIAGNOSIS — I5042 Chronic combined systolic (congestive) and diastolic (congestive) heart failure: Secondary | ICD-10-CM | POA: Diagnosis present

## 2020-07-22 DIAGNOSIS — I82402 Acute embolism and thrombosis of unspecified deep veins of left lower extremity: Secondary | ICD-10-CM | POA: Diagnosis present

## 2020-07-22 DIAGNOSIS — Y92239 Unspecified place in hospital as the place of occurrence of the external cause: Secondary | ICD-10-CM | POA: Diagnosis not present

## 2020-07-22 DIAGNOSIS — R54 Age-related physical debility: Secondary | ICD-10-CM | POA: Diagnosis present

## 2020-07-22 DIAGNOSIS — I4891 Unspecified atrial fibrillation: Secondary | ICD-10-CM | POA: Diagnosis present

## 2020-07-22 DIAGNOSIS — G8194 Hemiplegia, unspecified affecting left nondominant side: Secondary | ICD-10-CM | POA: Diagnosis present

## 2020-07-22 DIAGNOSIS — Z419 Encounter for procedure for purposes other than remedying health state, unspecified: Secondary | ICD-10-CM

## 2020-07-22 DIAGNOSIS — R29708 NIHSS score 8: Secondary | ICD-10-CM | POA: Diagnosis present

## 2020-07-22 DIAGNOSIS — E876 Hypokalemia: Secondary | ICD-10-CM | POA: Diagnosis present

## 2020-07-22 DIAGNOSIS — R471 Dysarthria and anarthria: Secondary | ICD-10-CM | POA: Diagnosis present

## 2020-07-22 DIAGNOSIS — R29898 Other symptoms and signs involving the musculoskeletal system: Secondary | ICD-10-CM

## 2020-07-22 DIAGNOSIS — R200 Anesthesia of skin: Secondary | ICD-10-CM

## 2020-07-22 DIAGNOSIS — Z87891 Personal history of nicotine dependence: Secondary | ICD-10-CM

## 2020-07-22 DIAGNOSIS — C7931 Secondary malignant neoplasm of brain: Secondary | ICD-10-CM | POA: Diagnosis present

## 2020-07-22 DIAGNOSIS — R29810 Facial weakness: Secondary | ICD-10-CM | POA: Diagnosis present

## 2020-07-22 DIAGNOSIS — E871 Hypo-osmolality and hyponatremia: Secondary | ICD-10-CM | POA: Diagnosis present

## 2020-07-22 DIAGNOSIS — E8809 Other disorders of plasma-protein metabolism, not elsewhere classified: Secondary | ICD-10-CM | POA: Diagnosis present

## 2020-07-22 DIAGNOSIS — Z923 Personal history of irradiation: Secondary | ICD-10-CM

## 2020-07-22 DIAGNOSIS — Z86711 Personal history of pulmonary embolism: Secondary | ICD-10-CM

## 2020-07-22 DIAGNOSIS — C439 Malignant melanoma of skin, unspecified: Secondary | ICD-10-CM | POA: Diagnosis present

## 2020-07-22 DIAGNOSIS — D6869 Other thrombophilia: Secondary | ICD-10-CM | POA: Diagnosis present

## 2020-07-22 DIAGNOSIS — Z66 Do not resuscitate: Secondary | ICD-10-CM | POA: Diagnosis present

## 2020-07-22 DIAGNOSIS — D649 Anemia, unspecified: Secondary | ICD-10-CM | POA: Diagnosis present

## 2020-07-22 DIAGNOSIS — Z20822 Contact with and (suspected) exposure to covid-19: Secondary | ICD-10-CM | POA: Diagnosis present

## 2020-07-22 LAB — CBC
HCT: 29 % — ABNORMAL LOW (ref 39.0–52.0)
Hemoglobin: 8.7 g/dL — ABNORMAL LOW (ref 13.0–17.0)
MCH: 28.2 pg (ref 26.0–34.0)
MCHC: 30 g/dL (ref 30.0–36.0)
MCV: 94.2 fL (ref 80.0–100.0)
Platelets: 245 10*3/uL (ref 150–400)
RBC: 3.08 MIL/uL — ABNORMAL LOW (ref 4.22–5.81)
RDW: 14.8 % (ref 11.5–15.5)
WBC: 24.6 10*3/uL — ABNORMAL HIGH (ref 4.0–10.5)
nRBC: 0 % (ref 0.0–0.2)

## 2020-07-22 LAB — I-STAT CHEM 8, ED
BUN: 13 mg/dL (ref 6–20)
Calcium, Ion: 1.14 mmol/L — ABNORMAL LOW (ref 1.15–1.40)
Chloride: 99 mmol/L (ref 98–111)
Creatinine, Ser: 0.6 mg/dL — ABNORMAL LOW (ref 0.61–1.24)
Glucose, Bld: 212 mg/dL — ABNORMAL HIGH (ref 70–99)
HCT: 28 % — ABNORMAL LOW (ref 39.0–52.0)
Hemoglobin: 9.5 g/dL — ABNORMAL LOW (ref 13.0–17.0)
Potassium: 3.3 mmol/L — ABNORMAL LOW (ref 3.5–5.1)
Sodium: 135 mmol/L (ref 135–145)
TCO2: 23 mmol/L (ref 22–32)

## 2020-07-22 LAB — DIFFERENTIAL
Abs Immature Granulocytes: 0.65 10*3/uL — ABNORMAL HIGH (ref 0.00–0.07)
Basophils Absolute: 0 10*3/uL (ref 0.0–0.1)
Basophils Relative: 0 %
Eosinophils Absolute: 0 10*3/uL (ref 0.0–0.5)
Eosinophils Relative: 0 %
Immature Granulocytes: 3 %
Lymphocytes Relative: 6 %
Lymphs Abs: 1.4 10*3/uL (ref 0.7–4.0)
Monocytes Absolute: 0.7 10*3/uL (ref 0.1–1.0)
Monocytes Relative: 3 %
Neutro Abs: 21.8 10*3/uL — ABNORMAL HIGH (ref 1.7–7.7)
Neutrophils Relative %: 88 %

## 2020-07-22 LAB — COMPREHENSIVE METABOLIC PANEL
ALT: 28 U/L (ref 0–44)
AST: 27 U/L (ref 15–41)
Albumin: 1.7 g/dL — ABNORMAL LOW (ref 3.5–5.0)
Alkaline Phosphatase: 294 U/L — ABNORMAL HIGH (ref 38–126)
Anion gap: 12 (ref 5–15)
BUN: 13 mg/dL (ref 6–20)
CO2: 22 mmol/L (ref 22–32)
Calcium: 8 mg/dL — ABNORMAL LOW (ref 8.9–10.3)
Chloride: 100 mmol/L (ref 98–111)
Creatinine, Ser: 0.72 mg/dL (ref 0.61–1.24)
GFR, Estimated: >60 mL/min (ref >60)
Glucose, Bld: 213 mg/dL — ABNORMAL HIGH (ref 70–99)
Potassium: 3.2 mmol/L — ABNORMAL LOW (ref 3.5–5.1)
Sodium: 134 mmol/L — ABNORMAL LOW (ref 135–145)
Total Bilirubin: 0.4 mg/dL (ref 0.3–1.2)
Total Protein: 5.5 g/dL — ABNORMAL LOW (ref 6.5–8.1)

## 2020-07-22 LAB — PROTIME-INR
INR: 1 (ref 0.8–1.2)
Prothrombin Time: 13 seconds (ref 11.4–15.2)

## 2020-07-22 LAB — APTT: aPTT: 28 seconds (ref 24–36)

## 2020-07-22 MED ORDER — LEVETIRACETAM IN NACL 1500 MG/100ML IV SOLN
1500.0000 mg | Freq: Once | INTRAVENOUS | Status: AC
  Administered 2020-07-22: 1500 mg via INTRAVENOUS
  Filled 2020-07-22: qty 100

## 2020-07-22 MED ORDER — SODIUM CHLORIDE 0.9% FLUSH
3.0000 mL | Freq: Once | INTRAVENOUS | Status: DC
Start: 2020-07-22 — End: 2020-07-31

## 2020-07-22 MED ORDER — IOHEXOL 350 MG/ML SOLN
100.0000 mL | Freq: Once | INTRAVENOUS | Status: AC | PRN
  Administered 2020-07-22: 100 mL via INTRAVENOUS

## 2020-07-22 NOTE — Progress Notes (Signed)
ANTICOAGULATION CONSULT NOTE - Initial Consult  Pharmacy Consult for Heparin Indication: atrial fibrillation, recent venous and arterial clot, acute CVA   No Known Allergies  Patient Measurements: Height: 5\' 3"  (160 cm) Weight: 73.7 kg (162 lb 8 oz) IBW/kg (Calculated) : 56.9 Heparin Dosing Weight: TBW  Vital Signs: Temp: 98.6 F (37 C) (02/19 2148) Temp Source: Oral (02/19 2148) BP: 124/72 (02/19 2345) Pulse Rate: 110 (02/19 2345)  Labs: Recent Labs    07/22/20 2200 07/22/20 2212  HGB 8.7* 9.5*  HCT 29.0* 28.0*  PLT 245  --   APTT 28  --   LABPROT 13.0  --   INR 1.0  --   CREATININE 0.72 0.60*    Estimated Creatinine Clearance: 89.4 mL/min (A) (by C-G formula based on SCr of 0.6 mg/dL (L)).   Medical History: Past Medical History:  Diagnosis Date  . Atrial fibrillation ([place]) 02/29/2020  . Depressed left ventricular ejection fraction 02/29/2020  . Malignant melanoma of other part of trunk ([place]) 02/20/2020  . Malignant melanoma of skin of trunk, except scrotum (HCC)     Medications:  See electronic med rec  Assessment: 60 y.o. M presents as code stroke. Pt on Lovenox 60mg  SQ q12h PTA for atrial fibrillation with recent venous and arterial clot status post vascular surgery 2 weeks ago. Last dose of Lovenox given 2/19 0930. To begin heparin per pharmacy with lower goal rate of 0.3-0.5 units/ml (neurology approved).  Goal of Therapy:  Heparin level 0.3-0.5 units/ml Monitor platelets by anticoagulation protocol: Yes   Plan:  Heparin gtt at 900 units/hr. No bolus.  Will f/u 8hr heparin level Daily heparin level and CBC  Sherlon Handing, PharmD, BCPS Please see amion for complete clinical pharmacist phone list 07/22/2020,11:50 PM

## 2020-07-22 NOTE — ED Provider Notes (Addendum)
[facility] EMERGENCY DEPARTMENT Provider Note   CSN: [id number] Arrival date & time: 07/22/20  2143     History Chief Complaint  Patient presents with  . Code Stroke    Adam Bender. is a 60 y.o. male.  Patient is a 59 year old male with multiple medical problems including metastatic malignant melanoma, brain metastases, atrial fibrillation with recent venous and arterial clot status post vascular surgery 2 weeks ago who is currently on Eliquis and arrives today due to acute change. Wife noted at 8 tonight he developed slurred speech, left-sided facial droop and weakness in his left arm. Patient reports earlier today he could lift both of his arms but now he has to use his right arm to lift the left arm because it is almost just dead weight. He denies any symptoms in his legs. He denies any specific pain in his arm and has noted that the swelling has improved. He has recently had radiation under the left axilla for adenopathy and palpable mass. He denies any recent fever, cough, congestion, shortness of breath, abdominal pain or vomiting.  The history is provided by the patient.       Past Medical History:  Diagnosis Date  . Atrial fibrillation ([place]) 02/29/2020  . Depressed left ventricular ejection fraction 02/29/2020  . Malignant melanoma of other part of trunk ([place]) 02/20/2020  . Malignant melanoma of skin of trunk, except scrotum [facility])     Patient Active Problem List   Diagnosis Date Noted  . Nausea with vomiting 06/29/2020  . Swollen lymph nodes 06/26/2020  . Pulmonary nodules 05/10/2020    Class: Chronic  . Atrial fibrillation ([place]) 02/29/2020  . Depressed left ventricular ejection fraction 02/29/2020  . Malignant melanoma of other part of trunk ([place]) 02/20/2020  . Brain metastases ([place]) 01/11/2020    Class: Chronic  . Skin, metastatic cancer to [facility]) 01/11/2020    Class: Chronic    Past Surgical History:  Procedure Laterality Date  .  MOLE REMOVAL    . SALIVARY GLAND SURGERY         Family History  Problem Relation Age of Onset  . Hypertension Mother   . Hypertension Father     Social History   Tobacco Use  . Smoking status: Former Research scientist (life sciences)  . Smokeless tobacco: Never Used    Home Medications Prior to Admission medications   Medication Sig Start Date End Date Taking? Authorizing Provider  benzonatate (TESSALON) 100 MG capsule Take 1 capsule by mouth three times daily as needed for cough 06/14/20   Mosher, Vida Roller A, PA-C  cyclobenzaprine (FLEXERIL) 10 MG tablet Take 1 tablet (10 mg total) by mouth 3 (three) times daily as needed for muscle spasms. 06/30/20   Derwood Kaplan, MD  dexamethasone (DECADRON) 4 MG tablet Take 1 tablet (4 mg total) by mouth 2 (two) times daily with a meal. 07/03/20   Mosher, Vida Roller A, PA-C  digoxin (LANOXIN) 0.125 MG tablet Take 1 tablet (125 mcg total) by mouth daily. 03/28/20   Richardo Priest, MD  diltiazem (CARDIZEM) 30 MG tablet Take 1 tablet (30 mg total) by mouth 2 (two) times daily. 05/16/20   Richardo Priest, MD  ELIQUIS 2.5 MG TABS tablet Take 1 tablet (2.5 mg total) by mouth 2 (two) times daily. 04/25/20   Dayton Scrape A, NP  empagliflozin (JARDIANCE) 10 MG TABS tablet Take 1 tablet (10 mg total) by mouth daily before breakfast. 03/02/20   Tobb, Kardie, DO  ENTRESTO  24-26 MG Take 1 tablet by mouth 2 (two) times daily. 05/19/20   [provider]  furosemide (LASIX) 20 MG tablet Take 1 tablet (20 mg total) by mouth daily. 03/24/20 06/22/20  Richardo Priest, MD  HYDROmorphone (DILAUDID) 2 MG tablet Take 1 tablet (2 mg total) by mouth every 4 (four) hours as needed for severe pain. 07/03/20   Mosher, Vida Roller A, PA-C  metoprolol tartrate (LOPRESSOR) 50 MG tablet Take 1 tablet (50 mg total) by mouth 2 (two) times daily. 06/26/20   Dayton Scrape A, NP  ondansetron (ZOFRAN ODT) 4 MG disintegrating tablet Take 1 tablet (4 mg total) by mouth every 4 (four) hours as needed for  nausea or vomiting. 06/29/20   Derwood Kaplan, MD  ondansetron (ZOFRAN) 4 MG tablet Take 4 mg by mouth every 8 (eight) hours as needed for nausea or vomiting.    [provider]  potassium chloride SA (KLOR-CON) 20 MEQ tablet Take 1 tablet (20 mEq total) by mouth daily. 05/16/20   Richardo Priest, MD  prochlorperazine (COMPAZINE) 10 MG tablet Take 10 mg by mouth every 6 (six) hours as needed for nausea or vomiting.    [provider]    Allergies    Patient has no known allergies.  Review of Systems   Review of Systems  All other systems reviewed and are negative.   Physical Exam Updated Vital Signs BP 128/84 (BP Location: Right Arm)   Pulse 100   Temp 98.6 F (37 C) (Oral)   Resp 18   SpO2 100%   Physical Exam Vitals and nursing note reviewed.  Constitutional:      General: He is not in acute distress.    Appearance: He is well-developed and well-nourished.  HENT:     Head: Normocephalic and atraumatic.     Mouth/Throat:     Mouth: Oropharynx is clear and moist.  Eyes:     Extraocular Movements: EOM normal.     Conjunctiva/sclera: Conjunctivae normal.     Pupils: Pupils are equal, round, and reactive to light.  Cardiovascular:     Rate and Rhythm: Tachycardia present. Rhythm irregularly irregular.     Pulses: Intact distal pulses.     Heart sounds: No murmur heard.   Pulmonary:     Effort: Pulmonary effort is normal. No respiratory distress.     Breath sounds: Normal breath sounds. No wheezing or rales.     Comments: Large indurated tender lesion present in the left upper chest and left axilla Chest:     Chest wall: Tenderness present.  Abdominal:     General: There is no distension.     Palpations: Abdomen is soft.     Tenderness: There is no abdominal tenderness. There is no guarding or rebound.  Musculoskeletal:        General: No tenderness or edema. Normal range of motion.     Cervical back: Normal range of motion and neck supple.      Comments: Wound vac in the right tib/fib without surrounding erythema.  Staples intact in the right groin without drainage and cath site on the left leg healing well without drainage.  Skin:    General: Skin is warm and dry.     Findings: No erythema or rash.  Neurological:     Mental Status: He is alert and oriented to person, place, and time.     Cranial Nerves: Cranial nerve deficit present.     Sensory: Sensory deficit present.  Motor: Weakness present.     Coordination: Finger-Nose-Finger Test normal.     Gait: Gait is intact.     Comments: 3/5 strength in the left upper ext with mild decreased sensation.  Minimal left facial droop in the mouth. 5/5 strength in bilateral lower ext.  Normal speech here.  Psychiatric:        Mood and Affect: Mood and affect normal.        Behavior: Behavior normal.     ED Results / Procedures / Treatments   Labs (all labs ordered are listed, but only abnormal results are displayed) Labs Reviewed  CBC - Abnormal; Notable for the following components:      Result Value   WBC 24.6 (*)    RBC 3.08 (*)    Hemoglobin 8.7 (*)    HCT 29.0 (*)    All other components within normal limits  DIFFERENTIAL - Abnormal; Notable for the following components:   Neutro Abs 21.8 (*)    Abs Immature Granulocytes 0.65 (*)    All other components within normal limits  I-STAT CHEM 8, ED - Abnormal; Notable for the following components:   Potassium 3.3 (*)    Creatinine, Ser 0.60 (*)    Glucose, Bld 212 (*)    Calcium, Ion 1.14 (*)    Hemoglobin 9.5 (*)    HCT 28.0 (*)    All other components within normal limits  PROTIME-INR  APTT  COMPREHENSIVE METABOLIC PANEL  CBG MONITORING, ED    EKG EKG Interpretation  Date/Time:  Saturday July 22 2020 21:46:39 EST Ventricular Rate:  131 PR Interval:    QRS Duration: 94 QT Interval:  310 QTC Calculation: 457 R Axis:   3 Text Interpretation: Atrial fibrillation with rapid ventricular response Marked ST  abnormality, possible inferolateral subendocardial injury No previous tracing Confirmed by Blanchie Dessert [phone]) on 07/22/2020 10:10:17 PM   Radiology CT HEAD CODE STROKE WO CONTRAST  Result Date: 07/22/2020 CLINICAL DATA:  Code stroke. Left-sided weakness. Metastatic disease EXAM: CT HEAD WITHOUT CONTRAST TECHNIQUE: Contiguous axial images were obtained from the base of the skull through the vertex without intravenous contrast. COMPARISON:  01/05/2020.  MRI 06/15/2020 FINDINGS: Brain: Known bilateral temporal lobe metastatic disease. Subtle architectural distortion of the inferior temporal lobe on the right. Some volume loss and distortion of the inferior and anterior left temporal lobe. Other tiny metastases previously seen are not specifically appreciable. There is no sign of acute infarction, hydrocephalus, hemorrhage or extra-axial fluid collection. Vascular: No abnormal vascular finding. Skull: Normal Sinuses/Orbits: Sinuses are clear.  Orbits are negative. Other: Adam Bender ASPECTS ([place] Stroke Program Early CT Score) - Ganglionic level infarction (caudate, lentiform nuclei, internal capsule, insula, M1-M3 cortex): 7 - Supraganglionic infarction (M4-M6 cortex): 3 Total score (0-10 with 10 being normal): 10 IMPRESSION: 1. Known bilateral temporal lobe metastatic disease. Subtle architectural distortion of the inferior temporal lobe on the right. Some volume loss and distortion of the inferior and anterior left temporal lobe. Other tiny metastases previously seen by MRI are not specifically appreciable. No acute finding by CT. 2. ASPECTS is 10. 3. These results were communicated to Dr. Leonel Ramsay at 10:19 pmon 2/19/2022by text page via the [facility] messaging system. Electronically Signed   By: Nelson Chimes M.D.   On: 07/22/2020 22:20    Procedures Procedures   Medications Ordered in ED Medications  sodium chloride flush (NS) 0.9 % injection 3 mL (has no administration in time range)    ED Course   I  have reviewed the triage vital signs and the nursing notes.  Pertinent labs & imaging results that were available during my care of the patient were reviewed by me and considered in my medical decision making (see chart for details).  Clinical Course as of 08/10/20 0741  Sat Jul 22, 2020  2312 CT Code Stroke CTA Head W/WO contrast [EK]    Clinical Course User Index [EK] Breck Coons, MD   MDM Rules/Calculators/A&P                          60 year old male with multiple medical problems including malignant melanoma, brain mets, recent bilateral thrombectomy in the lower extremities due to hypercoagulable state despite being on Eliquis who is now currently on Lovenox injections with a history of atrial fibrillation presents today after an episode of speech change, facial droop and inability to move the left arm with left hemianopia.  Unclear if there was some jerking during the episode.  Patient symptoms have somewhat improved while he is here but still has notable weakness in the left arm.  Patient symptoms started abruptly at 830 and upon arrival here he was within a 90-minute window and a code stroke was called.  Patient was taken to CT and initial CT was negative for acute issues other than known brain metastases however CTA showed an M3 occlusion and acute stroke.  Neurology is present and evaluating the patient.  His heart rate ranges between 110-130 but is most often around 115.  Wife reports that that is pretty normal for him.  He does have a leukocytosis of 24,000 today and a hemoglobin of 8.7.  I-STAT with normal renal function.  Patient is not a TPA candidate because of being on Lovenox and having a history of brain mets.  He is not a thrombectomy candidate given the location of the stroke.  Neurology recommendations to follow.  MDM Number of Diagnoses or Management Options   Amount and/or Complexity of Data Reviewed Clinical lab tests: ordered and reviewed Tests in the radiology  section of CPT: ordered and reviewed Tests in the medicine section of CPT: ordered and reviewed Decide to obtain previous medical records or to obtain history from someone other than the patient: yes Obtain history from someone other than the patient: yes Review and summarize past medical records: yes Discuss the patient with other providers: yes Independent visualization of images, tracings, or specimens: yes  Risk of Complications, Morbidity, and/or Mortality Presenting problems: high Diagnostic procedures: high Management options: high   CRITICAL CARE Performed by: Keta [place]valken[place] Total critical care time: 30 minutes Critical care time was exclusive of separately billable procedures and treating other patients. Critical care was necessary to treat or prevent imminent or life-threatening deterioration. Critical care was time spent personally by me on the following activities: development of treatment plan with patient and/or surrogate as well as nursing, discussions with consultants, evaluation of patient's response to treatment, examination of patient, obtaining history from patient or surrogate, ordering and performing treatments and interventions, ordering and review of laboratory studies, ordering and review of radiographic studies, pulse oximetry and re-evaluation of patient's condition.  Final Clinical Impression(s) / ED Diagnoses Final diagnoses:  Cerebrovascular accident (CVA), unspecified mechanism [facility])    Rx / DC Orders ED Discharge Orders    Adam Bender       Blanchie Dessert, MD 07/22/20 9622    Blanchie Dessert, MD 08/10/20 [phone]

## 2020-07-22 NOTE — Consult Note (Signed)
Neurology Consultation Reason for Consult: Left sided weakness Referring Physician: Maryan Rued, W  CC: Left sided weakness.   History is obtained from:patient, wife  HPI: Adam Bender. is a 60 y.o. male with a history of metastatic melanoma including metastasis to the brain who presents with left-sided weakness that started abruptly at 8:30 PM.  At that time, he was talking to his wife when he suddenly began talking like he had "marbles in his mouth."  She called 911 and when EMS was taken way, he is completely flaccid on the left side.  He was brought in and a code stroke was activated.  He was taken for CT which demonstrated stable metastasis, CTA reveals a M3 occlusion with some of the area already infarcted.   LKW: 8:30 PM tpa given?: no, anticoagulated    ROS: A 14 point ROS was performed and is negative except as noted in the HPI.  Past Medical History:  Diagnosis Date  . Atrial fibrillation ([place]) 02/29/2020  . Depressed left ventricular ejection fraction 02/29/2020  . Malignant melanoma of other part of trunk ([place]) 02/20/2020  . Malignant melanoma of skin of trunk, except scrotum (HCC)      Family History  Problem Relation Age of Onset  . Hypertension Mother   . Hypertension Father      Social History:  reports that he has quit smoking. He has never used smokeless tobacco. No history on file for alcohol use and drug use.   Exam: Current vital signs: BP 128/84 (BP Location: Right Arm)   Pulse 100   Temp 98.6 F (37 C) (Oral)   Resp 18   SpO2 100%  Vital signs in last 24 hours: Temp:  [98.6 F (37 C)] 98.6 F (37 C) (02/19 2148) Pulse Rate:  [100] 100 (02/19 2148) Resp:  [18] 18 (02/19 2148) BP: (128)/(84) 128/84 (02/19 2148) SpO2:  [100 %] 100 % (02/19 2148)   Physical Exam  Constitutional: Appears well-developed and well-nourished.  Psych: Affect appropriate to situation Eyes: No scleral injection HENT: No OP obstruction MSK: no joint deformities.   Cardiovascular: Normal rate and regular rhythm.  Respiratory: Effort normal, non-labored breathing GI: Soft.  No distension. There is no tenderness.  Skin: WDI  Neuro: Mental Status: Patient is awake, alert, oriented to person, place, month, year, and situation. Patient is able to give a clear and coherent history. No signs of aphasia  He does have left-sided neglect Cranial Nerves: II: Left hemianopia pupils are equal, round, and reactive to light.   III,IV, VI: EOMI without ptosis or diploplia.  V: Facial sensation diminished on the left VII: Facial movement with mild decreased nasolabial fold on the left Motor: Tone is normal. Bulk is normal.  4 -/5 strength in left arm, 4/5 strength in the left leg, 5/5 in the right Sensory: Sensation is diminished throughout the left side, he extinguishes to double simultaneous stimulation. Cerebellar: Does not perform   I have reviewed labs in epic and the results pertinent to this consultation are: Na 134 K 3.2  I have reviewed the images obtained: CT/CTA/CTP-right M3 branch occlusion with associated ischemia  Impression: 60 year old male with acute right MCA branch embolic infarct.  Possibilities include embolization due to atrial fibrillation versus paradoxical embolus due to his thrombus burden.  Given the difficulties with clotting, I am hesitant to stop anticoagulation but the infarct is of a decent size and therefore I would favor using a shorter acting agent such as heparin.  Given that  he was already on Lovenox, and is continuing to develop clots, may need to discuss other alternatives with hematology.  Recommendations: 1) continue anticoagulation with heparin 2) MRI brain 3) PT, OT, ST 4) frequent neurochecks 5) stroke team to follow   Roland Rack, MD [facility] [phone]  If 7pm- 7am, please page neurology on call as listed in [place].

## 2020-07-22 NOTE — ED Provider Notes (Signed)
Medical Decision Making: Care of patient assumed from Dr. Theora Gianotti at 2300.  Agree with history, physical exam and plan.  See their note for further details.  Briefly, The pt p/w patient arrived via code stroke for left upper extremity weakness.  Has a history of metastatic melanoma, to include brain metastases.  Has had complications to disease course with thromboembolic disease.  Had sudden onset of strokelike symptoms today.  Has an acute occlusion today.  Seen by neurology not a candidate for code stroke TPA.  But does need further evaluation with MR.   Current plan is as follows: Neuro for dispo of admit to them or medicine  Neurology recommends medicine admission for further evaluation and treatment of thromboembolic disease.  They recommend heparin and has the anticoagulation choice.  The neurology team will put that order in.  Family medicine consulted and agrees for admission.  I discussed with family medicine the possibility of infectious origin with the patient's tachycardia tachypnea and elevated white count.  However I feel this to be more likely related to his cancer.  We discussed the possibility of needing blood culture and urine culture.  I will let the family medicine team decide whether or not they want to order and follow-up on this.  I personally reviewed and interpreted all labs/imaging.      Breck Coons, MD 07/23/20 [phone]

## 2020-07-22 NOTE — ED Triage Notes (Signed)
Report from [place]...arrives via [place] [facility] from home. Pt had surgery 2 weeks ago, to remove right leg blood clots-still has staples and wound vac. He has known "clots in his lung, heart, brain" + Brain tumor.c/o no movement in the left arm since 815 tonight-no sensation on extremity, improved en route,now c/o throbbing pain.  Facial droop, slurred speech all started at the same time. Slurred speech on arrival to scene- went away prior to leaving the scene  that has now resolved. 12 lead afib, 120-150's cbg 275, 132/70, Pt ususally goes to [facility]. Alert and oriented.

## 2020-07-22 NOTE — ED Notes (Signed)
Dr. Maryan Rued notified of pt symptoms, pt brought back to trauma a for eval, Code Stroke initiated. Pt with decreased grips on the left hand, clear speech, no facial drooping.

## 2020-07-22 NOTE — Progress Notes (Incomplete)
ANTICOAGULATION CONSULT NOTE - Initial Consult  Pharmacy Consult for Heparin Indication: atrial fibrillation with recent venous and arterial clot   No Known Allergies  Patient Measurements: Height: 5\' 3"  (160 cm) Weight: 73.7 kg (162 lb 8 oz) IBW/kg (Calculated) : 56.9 Heparin Dosing Weight: TBW  Vital Signs: Temp: 98.6 F (37 C) (02/19 2148) Temp Source: Oral (02/19 2148) BP: 124/72 (02/19 2345) Pulse Rate: 110 (02/19 2345)  Labs: Recent Labs    07/22/20 2200 07/22/20 2212  HGB 8.7* 9.5*  HCT 29.0* 28.0*  PLT 245  --   APTT 28  --   LABPROT 13.0  --   INR 1.0  --   CREATININE 0.72 0.60*    Estimated Creatinine Clearance: 89.4 mL/min (A) (by C-G formula based on SCr of 0.6 mg/dL (L)).   Medical History: Past Medical History:  Diagnosis Date  . Atrial fibrillation ([place]) 02/29/2020  . Depressed left ventricular ejection fraction 02/29/2020  . Malignant melanoma of other part of trunk ([place]) 02/20/2020  . Malignant melanoma of skin of trunk, except scrotum (HCC)     Medications:  See electronic med rec  Assessment: 60 y.o. M presents as code stroke. Pt on Lovenox 60mg  SQ q12h PTA for atrial fibrillation with recent venous and arterial clot status post vascular surgery 2 weeks ago. Last dose of Lovenox given 2/19 0930. To begin heparin per pharmacy with lower goal rate of 0.3-0.5 units/ml (neurology approved).  Goal of Therapy:  {TCYEL:8590931} {Monitor platelets by anticoagulation protocol:3041561::"Monitor platelets by anticoagulation protocol: Yes"}   Plan:  {PETK:2446950}  Adam Bender 07/22/2020,11:50 PM

## 2020-07-23 ENCOUNTER — Inpatient Hospital Stay (HOSPITAL_COMMUNITY): Payer: 59

## 2020-07-23 DIAGNOSIS — I4891 Unspecified atrial fibrillation: Secondary | ICD-10-CM

## 2020-07-23 DIAGNOSIS — D6869 Other thrombophilia: Secondary | ICD-10-CM | POA: Diagnosis present

## 2020-07-23 DIAGNOSIS — Z789 Other specified health status: Secondary | ICD-10-CM

## 2020-07-23 DIAGNOSIS — R4781 Slurred speech: Secondary | ICD-10-CM | POA: Diagnosis present

## 2020-07-23 DIAGNOSIS — C439 Malignant melanoma of skin, unspecified: Secondary | ICD-10-CM

## 2020-07-23 DIAGNOSIS — Q211 Atrial septal defect: Secondary | ICD-10-CM | POA: Diagnosis not present

## 2020-07-23 DIAGNOSIS — I110 Hypertensive heart disease with heart failure: Secondary | ICD-10-CM | POA: Diagnosis present

## 2020-07-23 DIAGNOSIS — G8194 Hemiplegia, unspecified affecting left nondominant side: Secondary | ICD-10-CM | POA: Diagnosis not present

## 2020-07-23 DIAGNOSIS — R739 Hyperglycemia, unspecified: Secondary | ICD-10-CM | POA: Diagnosis not present

## 2020-07-23 DIAGNOSIS — I82210 Acute embolism and thrombosis of superior vena cava: Secondary | ICD-10-CM

## 2020-07-23 DIAGNOSIS — R4701 Aphasia: Secondary | ICD-10-CM | POA: Diagnosis present

## 2020-07-23 DIAGNOSIS — K5900 Constipation, unspecified: Secondary | ICD-10-CM | POA: Diagnosis present

## 2020-07-23 DIAGNOSIS — I745 Embolism and thrombosis of iliac artery: Secondary | ICD-10-CM

## 2020-07-23 DIAGNOSIS — Z20822 Contact with and (suspected) exposure to covid-19: Secondary | ICD-10-CM | POA: Diagnosis present

## 2020-07-23 DIAGNOSIS — I482 Chronic atrial fibrillation, unspecified: Secondary | ICD-10-CM | POA: Diagnosis not present

## 2020-07-23 DIAGNOSIS — I998 Other disorder of circulatory system: Secondary | ICD-10-CM | POA: Diagnosis not present

## 2020-07-23 DIAGNOSIS — C7931 Secondary malignant neoplasm of brain: Secondary | ICD-10-CM

## 2020-07-23 DIAGNOSIS — E871 Hypo-osmolality and hyponatremia: Secondary | ICD-10-CM | POA: Diagnosis present

## 2020-07-23 DIAGNOSIS — I63511 Cerebral infarction due to unspecified occlusion or stenosis of right middle cerebral artery: Secondary | ICD-10-CM | POA: Diagnosis not present

## 2020-07-23 DIAGNOSIS — R29898 Other symptoms and signs involving the musculoskeletal system: Secondary | ICD-10-CM | POA: Diagnosis not present

## 2020-07-23 DIAGNOSIS — Z515 Encounter for palliative care: Secondary | ICD-10-CM

## 2020-07-23 DIAGNOSIS — I82402 Acute embolism and thrombosis of unspecified deep veins of left lower extremity: Secondary | ICD-10-CM | POA: Diagnosis not present

## 2020-07-23 DIAGNOSIS — R29708 NIHSS score 8: Secondary | ICD-10-CM | POA: Diagnosis present

## 2020-07-23 DIAGNOSIS — Z419 Encounter for procedure for purposes other than remedying health state, unspecified: Secondary | ICD-10-CM | POA: Diagnosis not present

## 2020-07-23 DIAGNOSIS — I639 Cerebral infarction, unspecified: Secondary | ICD-10-CM | POA: Diagnosis present

## 2020-07-23 DIAGNOSIS — Z7189 Other specified counseling: Secondary | ICD-10-CM

## 2020-07-23 DIAGNOSIS — Z66 Do not resuscitate: Secondary | ICD-10-CM | POA: Diagnosis not present

## 2020-07-23 DIAGNOSIS — D649 Anemia, unspecified: Secondary | ICD-10-CM | POA: Diagnosis present

## 2020-07-23 DIAGNOSIS — Y92239 Unspecified place in hospital as the place of occurrence of the external cause: Secondary | ICD-10-CM | POA: Diagnosis not present

## 2020-07-23 DIAGNOSIS — I5042 Chronic combined systolic (congestive) and diastolic (congestive) heart failure: Secondary | ICD-10-CM | POA: Diagnosis present

## 2020-07-23 DIAGNOSIS — R200 Anesthesia of skin: Secondary | ICD-10-CM | POA: Diagnosis not present

## 2020-07-23 DIAGNOSIS — R29810 Facial weakness: Secondary | ICD-10-CM | POA: Diagnosis present

## 2020-07-23 DIAGNOSIS — E876 Hypokalemia: Secondary | ICD-10-CM | POA: Diagnosis present

## 2020-07-23 DIAGNOSIS — I63411 Cerebral infarction due to embolism of right middle cerebral artery: Secondary | ICD-10-CM | POA: Diagnosis present

## 2020-07-23 DIAGNOSIS — G936 Cerebral edema: Secondary | ICD-10-CM | POA: Diagnosis present

## 2020-07-23 LAB — CBC
HCT: 25 % — ABNORMAL LOW (ref 39.0–52.0)
Hemoglobin: 7.9 g/dL — ABNORMAL LOW (ref 13.0–17.0)
MCH: 28.9 pg (ref 26.0–34.0)
MCHC: 31.6 g/dL (ref 30.0–36.0)
MCV: 91.6 fL (ref 80.0–100.0)
Platelets: 201 10*3/uL (ref 150–400)
RBC: 2.73 MIL/uL — ABNORMAL LOW (ref 4.22–5.81)
RDW: 14.8 % (ref 11.5–15.5)
WBC: 23.3 10*3/uL — ABNORMAL HIGH (ref 4.0–10.5)
nRBC: 0 % (ref 0.0–0.2)

## 2020-07-23 LAB — BASIC METABOLIC PANEL
Anion gap: 11 (ref 5–15)
Anion gap: 9 (ref 5–15)
BUN: 11 mg/dL (ref 6–20)
BUN: 12 mg/dL (ref 6–20)
CO2: 20 mmol/L — ABNORMAL LOW (ref 22–32)
CO2: 21 mmol/L — ABNORMAL LOW (ref 22–32)
Calcium: 7.7 mg/dL — ABNORMAL LOW (ref 8.9–10.3)
Calcium: 8.2 mg/dL — ABNORMAL LOW (ref 8.9–10.3)
Chloride: 100 mmol/L (ref 98–111)
Chloride: 101 mmol/L (ref 98–111)
Creatinine, Ser: 0.68 mg/dL (ref 0.61–1.24)
Creatinine, Ser: 0.74 mg/dL (ref 0.61–1.24)
GFR, Estimated: >60 mL/min (ref >60)
GFR, Estimated: >60 mL/min (ref >60)
Glucose, Bld: 169 mg/dL — ABNORMAL HIGH (ref 70–99)
Glucose, Bld: 130 mg/dL — ABNORMAL HIGH (ref 70–99)
Potassium: 3.2 mmol/L — ABNORMAL LOW (ref 3.5–5.1)
Potassium: 4.7 mmol/L (ref 3.5–5.1)
Sodium: 131 mmol/L — ABNORMAL LOW (ref 135–145)
Sodium: 131 mmol/L — ABNORMAL LOW (ref 135–145)

## 2020-07-23 LAB — HEPARIN LEVEL (UNFRACTIONATED)
Heparin Unfractionated: 0.21 IU/mL — ABNORMAL LOW (ref 0.30–0.70)
Heparin Unfractionated: 0.13 IU/mL — ABNORMAL LOW (ref 0.30–0.70)

## 2020-07-23 LAB — RESP PANEL BY RT-PCR (FLU A&B, COVID) ARPGX2
Influenza A by PCR: NEGATIVE
Influenza B by PCR: NEGATIVE
SARS Coronavirus 2 by RT PCR: NEGATIVE

## 2020-07-23 LAB — HEMOGLOBIN AND HEMATOCRIT, BLOOD
HCT: 28.9 % — ABNORMAL LOW (ref 39.0–52.0)
Hemoglobin: 8.9 g/dL — ABNORMAL LOW (ref 13.0–17.0)

## 2020-07-23 LAB — MAGNESIUM
Magnesium: 1.6 mg/dL — ABNORMAL LOW (ref 1.7–2.4)
Magnesium: 2.2 mg/dL (ref 1.7–2.4)

## 2020-07-23 LAB — CBG MONITORING, ED
Glucose-Capillary: 124 mg/dL — ABNORMAL HIGH (ref 70–99)
Glucose-Capillary: 91 mg/dL (ref 70–99)
Glucose-Capillary: 123 mg/dL — ABNORMAL HIGH (ref 70–99)

## 2020-07-23 MED ORDER — SENNOSIDES-DOCUSATE SODIUM 8.6-50 MG PO TABS: 2.0000 | ORAL_TABLET | Freq: Every evening | ORAL | Status: DC | PRN

## 2020-07-23 MED ORDER — INSULIN ASPART 100 UNIT/ML ~~LOC~~ SOLN
0.0000 [IU] | Freq: Three times a day (TID) | SUBCUTANEOUS | Status: DC
  Administered 2020-07-23 – 2020-07-24 (×3): 1 [IU] via SUBCUTANEOUS
  Administered 2020-07-26: 3 [IU] via SUBCUTANEOUS
  Administered 2020-07-26 – 2020-07-27 (×2): 1 [IU] via SUBCUTANEOUS
  Administered 2020-07-27: 3 [IU] via SUBCUTANEOUS
  Administered 2020-07-28: 2 [IU] via SUBCUTANEOUS
  Administered 2020-07-28: 1 [IU] via SUBCUTANEOUS

## 2020-07-23 MED ORDER — DILTIAZEM HCL ER COATED BEADS 120 MG PO CP24: 120.0000 mg | ORAL_CAPSULE | Freq: Every day | ORAL | Status: DC

## 2020-07-23 MED ORDER — AMIODARONE HCL 200 MG PO TABS: 200.0000 mg | ORAL_TABLET | Freq: Every day | ORAL | Status: DC

## 2020-07-23 MED ORDER — DIGOXIN 125 MCG PO TABS
0.0625 mg | ORAL_TABLET | Freq: Every day | ORAL | Status: DC
  Administered 2020-07-23 – 2020-07-30 (×8): 0.0625 mg via ORAL
  Filled 2020-07-23 (×9): qty 1

## 2020-07-23 MED ORDER — POTASSIUM CHLORIDE CRYS ER 20 MEQ PO TBCR
40.0000 meq | EXTENDED_RELEASE_TABLET | Freq: Once | ORAL | Status: AC
  Administered 2020-07-23: 40 meq via ORAL
  Filled 2020-07-23: qty 2

## 2020-07-23 MED ORDER — HYDROMORPHONE HCL 1 MG/ML IJ SOLN
0.5000 mg | INTRAMUSCULAR | Status: DC | PRN
Start: 2020-07-23 — End: 2020-07-23
  Administered 2020-07-23 (×2): 0.5 mg via INTRAVENOUS
  Filled 2020-07-23 (×2): qty 1

## 2020-07-23 MED ORDER — HEPARIN (PORCINE) 25000 UT/250ML-% IV SOLN
1550.0000 [IU]/h | INTRAVENOUS | Status: DC
  Filled 2020-07-23 (×2): qty 250

## 2020-07-23 MED ORDER — METOPROLOL TARTRATE 50 MG PO TABS
50.0000 mg | ORAL_TABLET | Freq: Two times a day (BID) | ORAL | Status: DC
  Administered 2020-07-23 – 2020-07-30 (×15): 50 mg via ORAL
  Filled 2020-07-23: qty 2
  Filled 2020-07-23 (×8): qty 1
  Filled 2020-07-23: qty 2
  Filled 2020-07-23 (×4): qty 1
  Filled 2020-07-23: qty 2

## 2020-07-23 MED ORDER — DILTIAZEM HCL ER COATED BEADS 180 MG PO CP24
180.0000 mg | ORAL_CAPSULE | Freq: Every day | ORAL | Status: DC
  Administered 2020-07-23 – 2020-07-30 (×8): 180 mg via ORAL
  Filled 2020-07-23 (×9): qty 1

## 2020-07-23 MED ORDER — MELATONIN 3 MG PO TABS
3.0000 mg | ORAL_TABLET | Freq: Every day | ORAL | Status: DC
  Administered 2020-07-23 – 2020-07-29 (×8): 3 mg via ORAL
  Filled 2020-07-23 (×8): qty 1

## 2020-07-23 MED ORDER — ONDANSETRON 4 MG PO TBDP
4.0000 mg | ORAL_TABLET | ORAL | Status: DC | PRN
  Administered 2020-07-23: 4 mg via ORAL
  Filled 2020-07-23: qty 1

## 2020-07-23 MED ORDER — ASPIRIN 81 MG PO CHEW
81.0000 mg | CHEWABLE_TABLET | Freq: Every day | ORAL | Status: DC
  Administered 2020-07-23 – 2020-07-30 (×8): 81 mg via ORAL
  Filled 2020-07-23 (×8): qty 1

## 2020-07-23 MED ORDER — GADOBUTROL 1 MMOL/ML IV SOLN
7.0000 mL | Freq: Once | INTRAVENOUS | Status: AC | PRN
  Administered 2020-07-23: 7 mL via INTRAVENOUS

## 2020-07-23 MED ORDER — HYDROMORPHONE HCL 1 MG/ML IJ SOLN
0.5000 mg | INTRAMUSCULAR | Status: DC | PRN
  Administered 2020-07-23 – 2020-07-27 (×8): 0.5 mg via INTRAVENOUS
  Filled 2020-07-23 (×4): qty 1
  Filled 2020-07-23 (×2): qty 0.5
  Filled 2020-07-23: qty 1
  Filled 2020-07-23: qty 0.5
  Filled 2020-07-23: qty 1

## 2020-07-23 MED ORDER — MAGNESIUM SULFATE 2 GM/50ML IV SOLN
2.0000 g | Freq: Once | INTRAVENOUS | Status: AC
  Administered 2020-07-23: 2 g via INTRAVENOUS
  Filled 2020-07-23: qty 50

## 2020-07-23 MED ORDER — ACETAMINOPHEN 500 MG PO TABS: 1000.0000 mg | ORAL_TABLET | Freq: Four times a day (QID) | ORAL | Status: DC | PRN

## 2020-07-23 MED ORDER — DEXAMETHASONE 4 MG PO TABS
4.0000 mg | ORAL_TABLET | Freq: Once | ORAL | Status: AC
  Administered 2020-07-23: 4 mg via ORAL
  Filled 2020-07-23: qty 1

## 2020-07-23 MED ORDER — AMIODARONE HCL 200 MG PO TABS
200.0000 mg | ORAL_TABLET | Freq: Every day | ORAL | Status: DC
  Administered 2020-07-23 – 2020-07-30 (×8): 200 mg via ORAL
  Filled 2020-07-23 (×8): qty 1

## 2020-07-23 MED ORDER — LIDOCAINE 5 % EX PTCH
1.0000 | MEDICATED_PATCH | Freq: Every day | CUTANEOUS | Status: DC
  Administered 2020-07-23 – 2020-07-30 (×6): 1 via TRANSDERMAL
  Filled 2020-07-23 (×6): qty 1

## 2020-07-23 MED ORDER — GABAPENTIN 100 MG PO CAPS
100.0000 mg | ORAL_CAPSULE | Freq: Three times a day (TID) | ORAL | Status: DC
  Administered 2020-07-23 – 2020-07-30 (×22): 100 mg via ORAL
  Filled 2020-07-23 (×22): qty 1

## 2020-07-23 MED ORDER — GABAPENTIN 100 MG PO CAPS
100.0000 mg | ORAL_CAPSULE | Freq: Once | ORAL | Status: AC
  Administered 2020-07-23: 100 mg via ORAL
  Filled 2020-07-23: qty 1

## 2020-07-23 MED ORDER — OXYCODONE HCL 5 MG PO TABS
10.0000 mg | ORAL_TABLET | Freq: Four times a day (QID) | ORAL | Status: DC | PRN
  Administered 2020-07-23 – 2020-07-30 (×17): 10 mg via ORAL
  Filled 2020-07-23 (×17): qty 2

## 2020-07-23 MED ORDER — DEXAMETHASONE 4 MG PO TABS: 2.0000 mg | ORAL_TABLET | Freq: Every day | ORAL | Status: DC

## 2020-07-23 MED ORDER — HEPARIN (PORCINE) 25000 UT/250ML-% IV SOLN
900.0000 [IU]/h | INTRAVENOUS | Status: DC
  Administered 2020-07-23: 900 [IU]/h via INTRAVENOUS
  Filled 2020-07-23: qty 250

## 2020-07-23 MED ORDER — CYCLOBENZAPRINE HCL 10 MG PO TABS
10.0000 mg | ORAL_TABLET | Freq: Three times a day (TID) | ORAL | Status: DC | PRN
  Administered 2020-07-23 – 2020-07-29 (×6): 10 mg via ORAL
  Filled 2020-07-23 (×7): qty 1

## 2020-07-23 NOTE — ED Notes (Signed)
Breakfast ordered 

## 2020-07-23 NOTE — ED Notes (Signed)
Patient transported to MRI 

## 2020-07-23 NOTE — ED Notes (Signed)
Wound vac disconnected by wife for MRI and to be connected by wife when pt returns to the room.

## 2020-07-23 NOTE — Progress Notes (Signed)
STROKE TEAM PROGRESS NOTE   INTERVAL HISTORY No acute events since arrival.   Adam Deharo. is a 60 y.o. 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 He presented via EMS.    Today Adam Bender reports he is s/p radiation to his LUE recently at [facility] and is complaining of an "uncomfortable" sensation in his LUE that is not pain but a feeling of "twitching constantly and inability to get into a comfortable position." This sensation is a constant. He is very distressed by it and will focus only on it during our visit.  He states it starts at his elbow and radiates anteriorly across dorsal hand surface to 4th finger sparing his other fingers.  Wife reports patient was at [facility] recently and underwent LE thrombectomy after "almost losing his right leg" with wound VAC placed to right anterior shin at that time. He has also been receiving radiation to LUE which has been extremely painful for him. She reports he was discharged on Lovenox and ASA. She reports his speech is much better currently and that his arm strength has also improved.    Vitals:   07/23/20 0630 07/23/20 0700 07/23/20 0715 07/23/20 0815  BP: 106/75 112/62 119/74 128/73  Pulse: 78 94 (!) 104 (!) 140  Resp:    20  Temp:      TempSrc:      SpO2: 99% 97% 95% 96%  Weight:      Height:       CBC:  Recent Labs  Lab 07/22/20 2200 07/22/20 2212 07/23/20 0310  WBC 24.6*  --  23.3*  NEUTROABS 21.8*  --   --   HGB 8.7* 9.5* 7.9*  HCT 29.0* 28.0* 25.0*  MCV 94.2  --  91.6  PLT 245  --  182   Basic Metabolic Panel:  Recent Labs  Lab 07/22/20 2200 07/22/20 2212 07/23/20 0310  NA 134* 135 131*  K 3.2* 3.3* 3.2*  CL 100 99 100  CO2 22  --  20*  GLUCOSE 213* 212* 169*  BUN 13 13 11   CREATININE 0.72 0.60* 0.68  CALCIUM 8.0*  --  7.7*  MG  --   --  1.6*   Lipid Panel: No results for input(s): CHOL, TRIG,  HDL, CHOLHDL, VLDL, LDLCALC in the last 168 hours. HgbA1c: No results for input(s): HGBA1C in the last 168 hours. Urine Drug Screen: No results for input(s): LABOPIA, COCAINSCRNUR, LABBENZ, AMPHETMU, THCU, LABBARB in the last 168 hours.  Alcohol Level No results for input(s): ETH in the last 168 hours.  IMAGING past 24 hours CT Code Stroke CTA Head W/WO contrast  Result Date: 07/22/2020 CLINICAL DATA:  Metastatic disease to the brain. Left-sided weakness. Speech disturbance. EXAM: CT ANGIOGRAPHY HEAD AND NECK CT PERFUSION BRAIN TECHNIQUE: Multidetector CT imaging of the head and neck was performed using the standard protocol during bolus administration of intravenous contrast. Multiplanar CT image reconstructions and MIPs were obtained to evaluate the vascular anatomy. Carotid stenosis measurements (when applicable) are obtained utilizing NASCET criteria, using the distal internal carotid diameter as the denominator. Multiphase CT imaging of the brain was performed following IV bolus contrast injection. Subsequent parametric perfusion maps were calculated using RAPID software. CONTRAST:  172mL OMNIPAQUE IOHEXOL 350 MG/ML SOLN COMPARISON:  Head CT earlier same day.  MRI 06/15/2020. FINDINGS: CTA NECK FINDINGS Aortic arch: Normal.  No atherosclerotic change. Right carotid  system: Common carotid artery widely patent to the bifurcation. Carotid bifurcation is normal without stenosis or irregularity. Cervical ICA widely patent. Left carotid system: Common carotid artery widely patent to the bifurcation. Carotid bifurcation is normal. Cervical ICA is normal. Vertebral arteries: Both vertebral artery origins are widely patent. Both vertebral arteries appear normal through the cervical region to the foramen magnum. Skeleton: Mid cervical spondylosis. Other neck: No neck mass identified. Upper chest: Hilar and mediastinal lymphadenopathy. Review of the MIP images confirms the above findings CTA HEAD FINDINGS Anterior  circulation: Both internal carotid arteries are patent through the skull base and siphon regions. The left anterior circulation is normal. On the right, there is an occluded distal M3 branch in the inferior parietal region. Posterior circulation: Both vertebral arteries widely patent to the basilar. No basilar stenosis. Posterior circulation branch vessels are patent. Patent bilateral posterior communicating arteries. Venous sinuses: Patent and normal. Anatomic variants: None significant. Review of the MIP images confirms the above findings CT Brain Perfusion Findings: ASPECTS: 10 on the initial head CT reading. With the benefit the perfusion imaging, there could be a score of 9 awarded, with slight loss of gray-white differentiation at the inferior right parietal region. CBF (<30%) Volume: 5mL Perfusion (Tmax>6.0s) volume: 31mL Mismatch Volume: 21mL Infarction Location:Inferior right parietal lobe. IMPRESSION: 1. Occlusion of a distal right M3 branch in the inferior right parietal region. 9 mL core infarction with slight loss of gray-white differentiation at the inferior right parietal region. 19 CC region of at risk brain with a mismatch volume of 19 mL. 2. No carotid bifurcation disease. No visible aortic atherosclerosis. 3. Hilar and mediastinal lymphadenopathy consistent with the diagnosis of metastatic disease. Electronically Signed   By: Nelson Chimes M.D.   On: 07/22/2020 22:50   CT Code Stroke CTA Neck W/WO contrast  Result Date: 07/22/2020 CLINICAL DATA:  Metastatic disease to the brain. Left-sided weakness. Speech disturbance. EXAM: CT ANGIOGRAPHY HEAD AND NECK CT PERFUSION BRAIN TECHNIQUE: Multidetector CT imaging of the head and neck was performed using the standard protocol during bolus administration of intravenous contrast. Multiplanar CT image reconstructions and MIPs were obtained to evaluate the vascular anatomy. Carotid stenosis measurements (when applicable) are obtained utilizing NASCET  criteria, using the distal internal carotid diameter as the denominator. Multiphase CT imaging of the brain was performed following IV bolus contrast injection. Subsequent parametric perfusion maps were calculated using RAPID software. CONTRAST:  110mL OMNIPAQUE IOHEXOL 350 MG/ML SOLN COMPARISON:  Head CT earlier same day.  MRI 06/15/2020. FINDINGS: CTA NECK FINDINGS Aortic arch: Normal.  No atherosclerotic change. Right carotid system: Common carotid artery widely patent to the bifurcation. Carotid bifurcation is normal without stenosis or irregularity. Cervical ICA widely patent. Left carotid system: Common carotid artery widely patent to the bifurcation. Carotid bifurcation is normal. Cervical ICA is normal. Vertebral arteries: Both vertebral artery origins are widely patent. Both vertebral arteries appear normal through the cervical region to the foramen magnum. Skeleton: Mid cervical spondylosis. Other neck: No neck mass identified. Upper chest: Hilar and mediastinal lymphadenopathy. Review of the MIP images confirms the above findings CTA HEAD FINDINGS Anterior circulation: Both internal carotid arteries are patent through the skull base and siphon regions. The left anterior circulation is normal. On the right, there is an occluded distal M3 branch in the inferior parietal region. Posterior circulation: Both vertebral arteries widely patent to the basilar. No basilar stenosis. Posterior circulation branch vessels are patent. Patent bilateral posterior communicating arteries. Venous sinuses: Patent and normal. Anatomic  variants: None significant. Review of the MIP images confirms the above findings CT Brain Perfusion Findings: ASPECTS: 10 on the initial head CT reading. With the benefit the perfusion imaging, there could be a score of 9 awarded, with slight loss of gray-white differentiation at the inferior right parietal region. CBF (<30%) Volume: 13mL Perfusion (Tmax>6.0s) volume: 85mL Mismatch Volume: 13mL  Infarction Location:Inferior right parietal lobe. IMPRESSION: 1. Occlusion of a distal right M3 branch in the inferior right parietal region. 9 mL core infarction with slight loss of gray-white differentiation at the inferior right parietal region. 19 CC region of at risk brain with a mismatch volume of 19 mL. 2. No carotid bifurcation disease. No visible aortic atherosclerosis. 3. Hilar and mediastinal lymphadenopathy consistent with the diagnosis of metastatic disease. Electronically Signed   By: Nelson Chimes M.D.   On: 07/22/2020 22:50   CT Code Stroke Cerebral Perfusion with contrast  Result Date: 07/22/2020 CLINICAL DATA:  Metastatic disease to the brain. Left-sided weakness. Speech disturbance. EXAM: CT ANGIOGRAPHY HEAD AND NECK CT PERFUSION BRAIN TECHNIQUE: Multidetector CT imaging of the head and neck was performed using the standard protocol during bolus administration of intravenous contrast. Multiplanar CT image reconstructions and MIPs were obtained to evaluate the vascular anatomy. Carotid stenosis measurements (when applicable) are obtained utilizing NASCET criteria, using the distal internal carotid diameter as the denominator. Multiphase CT imaging of the brain was performed following IV bolus contrast injection. Subsequent parametric perfusion maps were calculated using RAPID software. CONTRAST:  146mL OMNIPAQUE IOHEXOL 350 MG/ML SOLN COMPARISON:  Head CT earlier same day.  MRI 06/15/2020. FINDINGS: CTA NECK FINDINGS Aortic arch: Normal.  No atherosclerotic change. Right carotid system: Common carotid artery widely patent to the bifurcation. Carotid bifurcation is normal without stenosis or irregularity. Cervical ICA widely patent. Left carotid system: Common carotid artery widely patent to the bifurcation. Carotid bifurcation is normal. Cervical ICA is normal. Vertebral arteries: Both vertebral artery origins are widely patent. Both vertebral arteries appear normal through the cervical region  to the foramen magnum. Skeleton: Mid cervical spondylosis. Other neck: No neck mass identified. Upper chest: Hilar and mediastinal lymphadenopathy. Review of the MIP images confirms the above findings CTA HEAD FINDINGS Anterior circulation: Both internal carotid arteries are patent through the skull base and siphon regions. The left anterior circulation is normal. On the right, there is an occluded distal M3 branch in the inferior parietal region. Posterior circulation: Both vertebral arteries widely patent to the basilar. No basilar stenosis. Posterior circulation branch vessels are patent. Patent bilateral posterior communicating arteries. Venous sinuses: Patent and normal. Anatomic variants: None significant. Review of the MIP images confirms the above findings CT Brain Perfusion Findings: ASPECTS: 10 on the initial head CT reading. With the benefit the perfusion imaging, there could be a score of 9 awarded, with slight loss of gray-white differentiation at the inferior right parietal region. CBF (<30%) Volume: 39mL Perfusion (Tmax>6.0s) volume: 44mL Mismatch Volume: 49mL Infarction Location:Inferior right parietal lobe. IMPRESSION: 1. Occlusion of a distal right M3 branch in the inferior right parietal region. 9 mL core infarction with slight loss of gray-white differentiation at the inferior right parietal region. 19 CC region of at risk brain with a mismatch volume of 19 mL. 2. No carotid bifurcation disease. No visible aortic atherosclerosis. 3. Hilar and mediastinal lymphadenopathy consistent with the diagnosis of metastatic disease. Electronically Signed   By: Nelson Chimes M.D.   On: 07/22/2020 22:50   DG Chest Port 1 View  Result Date:  07/22/2020 CLINICAL DATA:  Weakness EXAM: PORTABLE CHEST 1 VIEW COMPARISON:  07/04/2020 FINDINGS: There is cardiomegaly. The previously demonstrated axillary and hilar adenopathy is better visualized on prior CT scans. Both lungs are clear. The visualized skeletal  structures are unremarkable. There is a well-positioned right-sided Port-A-Cath. IMPRESSION: No definite acute cardiopulmonary process. Electronically Signed   By: Constance Holster M.D.   On: 07/22/2020 22:52   CT HEAD CODE STROKE WO CONTRAST  Result Date: 07/22/2020 CLINICAL DATA:  Code stroke. Left-sided weakness. Metastatic disease EXAM: CT HEAD WITHOUT CONTRAST TECHNIQUE: Contiguous axial images were obtained from the base of the skull through the vertex without intravenous contrast. COMPARISON:  01/05/2020.  MRI 06/15/2020 FINDINGS: Brain: Known bilateral temporal lobe metastatic disease. Subtle architectural distortion of the inferior temporal lobe on the right. Some volume loss and distortion of the inferior and anterior left temporal lobe. Other tiny metastases previously seen are not specifically appreciable. There is no sign of acute infarction, hydrocephalus, hemorrhage or extra-axial fluid collection. Vascular: No abnormal vascular finding. Skull: Normal Sinuses/Orbits: Sinuses are clear.  Orbits are negative. Other: None ASPECTS ([place] Stroke Program Early CT Score) - Ganglionic level infarction (caudate, lentiform nuclei, internal capsule, insula, M1-M3 cortex): 7 - Supraganglionic infarction (M4-M6 cortex): 3 Total score (0-10 with 10 being normal): 10 IMPRESSION: 1. Known bilateral temporal lobe metastatic disease. Subtle architectural distortion of the inferior temporal lobe on the right. Some volume loss and distortion of the inferior and anterior left temporal lobe. Other tiny metastases previously seen by MRI are not specifically appreciable. No acute finding by CT. 2. ASPECTS is 10. 3. These results were communicated to Dr. Leonel Ramsay at 10:19 pmon 2/19/2022by text page via the [facility] messaging system. Electronically Signed   By: Nelson Chimes M.D.   On: 07/22/2020 22:20   PHYSICAL EXAM Physical Exam  Constitutional: Appears well-developed and well-nourished.  Psych: In mild distress  regarding LUE discomfort  Eyes: No scleral injection HENT: No OP obstruction MSK: no joint deformities.  Cardiovascular: Normal rate and regular rhythm per tele.  Respiratory: Effort normal, non-labored breathing  Skin: WDI  Neuro: Mental Status: Patient is awake, alert, oriented to person, place, month, year, and situation. Patient is able to give a clear and coherent history. No signs of aphasia  Cranial Nerves: II: Left hemianopia persists.  pupils are equal, round, and reactive to light at 50mm.   III,IV, VI: EOMI without ptosis or diploplia.  V: He reports equal facial sensation  VII: Facial movement with mild decreased nasolabial fold on the left Motor: Tone is normal. Bulk is normal.  4 -/5 strength in left arm, 4/5 strength in the left leg, 5/5 in the right Sensory: Sensation is diminished throughout the left side, he extinguishes to double simultaneous stimulation. Cerebellar: UTA LUE due to discomfort as patient will not stop supporting his LUE with RUE to participate  Unable to test LE due to wound [facility]   ASSESSMENT/PLAN Patient .   Stevan Eberwein Mattix Imhof. is a 60 y.o. 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  At that time, he was talking to his wife when he suddenly began talking like he had "marbles in his mouth."  She called 911 and when EMS was taken way, he was completely flaccid on the left side.  He was brought in and a code stroke was activated.  He was taken for CT which demonstrated stable metastasis, CTA reveals a M3 occlusion with some of the area already infarcted.  Stroke - Acute right MCA embolic infarct - likely due to hyercoagulable state with advance malignancy vs. atrial fibrillation vs. paradoxical embolus   Code Stroke CT head - No  acute Finding, known metastatic disease   CTA head & neck: right M3 branch occlusion with associated ischemia  CTP 9cc core infarct with 19cc penumbra  MRI with and without Pending  2D Echo 06/17/2020, EF 60-65%  LDL pending  A1C pending  On Lovenox 60mg  BID with ASA 81mg  daily PTA. On heparin drip now with ASA 81mg .   Therapy recommendations:  TBD  Disposition:  TBD  Metastatic melanoma  Follows with Dr. Hinton Rao.    Metastases to brain, pulmonary nodules, left axillary nodules, on steroid taper  decadron taper: 4 mg x4 days (last day of 4 mg is 07/23/20), then 2 mg x7 days (2/21-2/27), 1 mg x7days (2/28-3/6)  MRI 06/15/20 - mixed treatment response with enlarged right inferior temporal lobe lesion and new right frontal operculum lesion and left frontal leptomenigeal enhancement. Interval resolution of leptomeningeal enhancement the left postcentral sulcus.   Was on eliquis 2.5mg  bid and ASA prior 06/2020 admission to [facility], was on lovenox 60mg  bid with ASA PTA  Now on heparin IV and ASA  Chronic AF  Was on eliquis 2.5mg  bid and ASA prior 06/2020 admission to [facility], was on lovenox 60mg  bid with ASA PTA  Now on heparin IV and ASA  B/l DVT  Treated at [facility]/2020  eilquis 2.5mg  bid and ASA changed to lovenox 60mg  bid and ASA  Now on heparin IV and ASA  Hyperlipidemia  Home meds:  None   LDL pending goal < 70  Other Stroke Risk Factors  Former Cigarette smoker  Other Active Problems  Hypokalemia - supplement  Leukocytosis - WBC 24.6->23.3  Anemia of chronic disease - Hb 9.5->7.9->8.9  Hospital day # 0   ATTENDING NOTE: I reviewed above note and agree with the assessment and plan. Pt was seen and examined.   60 year old male with history of malignant melanoma metastasis to brain and other areas, chronic A. fib, recent DVT admitted for left-sided weakness and aphasia.  CT no acute finding except brain metastasis.  CT head and neck right M3 occlusion.  CT  perfusion showed 9 cc core and 28 cc TTP.  MRI with and without contrast pending.  EF 60 to 65%.  Creatinine 0.68.  WBC 23.3, hemoglobin 7.9-8.9.  General - Well nourished, well developed, in no apparent distress.  Ophthalmologic - fundi not visualized due to noncooperation.  Cardiovascular - Regular rhythm and rate.  Mental Status -  Level of arousal and orientation to time, place, and person were intact. Language including expression, naming, repetition, comprehension was assessed and found intact.  Cranial Nerves II - XII - II - Visual field testing showed left lower quadrantanopia, left upper quadrant simutalgnosia. III, IV, VI - Extraocular movements intact. V - Facial sensation intact bilaterally. VII - mild left facial droop. VIII - Hearing & vestibular intact bilaterally. X - Palate elevates symmetrically XI - Chin turning & shoulder shrug intact bilaterally. XII - Tongue protrusion intact.  Motor Strength - The patient's strength was normal in RUE and RLE, however, left UE proximal 3-/5 and distal 3/5. Left LE 5-/5 proximal and distal and pronator drift was absent.  Bulk  was normal and fasciculations were absent.   Motor Tone - Muscle tone was assessed at the neck and appendages and was normal.  Reflexes - The patient's reflexes were symmetrical in all extremities and he had no pathological reflexes.  Sensory - Light touch, temperature/pinprick were assessed and were symmetrical.    Coordination - The patient had normal movements in the right hand with no ataxia or dysmetria.  Tremor was absent.  Gait and Station - deferred.  Patient stroke etiology most likely due to hypercoagulable state versus chronic A. fib versus paradoxical emboli.  Patient has been on Eliquis 2.5 mg twice daily (underdosed) and aspirin in the past for A. fib and hypercoagulable state due to malignant cancer.  In 06/2020 developed DVT, treated at [facility] changed to Lovenox 60 mg twice daily and  aspirin.  Currently on heparin IV and aspirin.  Not much to add from neuro standpoint.  Will follow.  Detailed assessment plan please refer above where I made changes where necessary.   Rosalin Hawking, MD PhD Stroke Neurology 07/23/2020 5:46 PM     To contact Stroke Continuity provider, please refer to [email or web address] After hours, contact General Neurology

## 2020-07-23 NOTE — ED Notes (Signed)
Pt had a bowel movement, cleaned pt up and gave new sheets.

## 2020-07-23 NOTE — ED Notes (Signed)
Pt had bowel movement x1.

## 2020-07-23 NOTE — Consult Note (Addendum)
Consultation Note Date: 07/23/2020   Patient Name: Adam Bender.  DOB: August 19, 1960  MRN: [id number]  Age / Sex: 60 y.o., male  PCP: Patient, No Pcp Per Referring Physician: Martyn Malay, MD  Reason for Consultation: Establishing goals of care, "metastatic malignant melanoma"  HPI/Patient Profile: 60 y.o. 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 Code Stroke was initiated on arrival to ED and CT of head/neck was obtained - cerebrovascular accident was confirmed due to likely embolic event to right M3 parietal lesion and corresponding ischemia. Patient was admitted on 07/22/2020 with CVA with left sided weakness and metastatic melanoma.   Neurology has been consulted and recommend MRI.   Of note, patient had recent venous and arterial clot s/p vascular surgery 2 weeks ago at [facility]. Wound vac and staples are in place.  Albumin is noted to be 1.7 on 07/22/20.  Clinical Assessment and Goals of Care: I have reviewed medical records including EPIC notes, labs, and imaging. Received report from primary RN - no acute concerns.   Went to visit patient at bedside - [name] was present. Patient was lying in bed awake, alert, oriented, and able to participate in conversation; however, at times does seemed to be easily distracted and off topic during conversation. I am unsure if he is able to make complex medical decisions for himself at this time. Patient is extremely fixated on his left hand and it's numbness. Patient states he is in pain at the beginning of visit - requested RN administer  pain medication, which she kindly did promptly. No respiratory distress, increased work of breathing, or secretions noted. Patient appears weak and frail.  Met with patient and [name]  to discuss diagnosis, prognosis, GOC, EOL wishes, disposition, and options.  I introduced Palliative Medicine as specialized medical care for people living with serious illness. It focuses on providing relief from the symptoms and stress of a serious illness. The goal is to improve quality of life for both the patient and the family.  We discussed a brief life review of the patient as well as functional and nutritional status. Adam Bender and Adam Bender have been married for 39 years. They have 2 children together whom Adam Bender states she can rely on for support. Prior to this hospitalization, patient was living in a private residence with his wife. Adam Bender explains that Adam Bender was functionally independent prior to his hospitalization at [facility] two weeks ago; now he needs assistance. Adam Bender explains the patient was discharged from [facility] and returned home last Thursday. Adam Bender was diagnosed with cancer in August 2021. At that time he began immunotherapy with [facility]. Adam Bender states this treatment did not go well. He was schedule to start radiation at [facility] but unfortunately  was not able to begin due to his admission at [facility] for blood clots. We discussed if his treatments were aimed at cure vs palliative - Adam Bender states they were palliative treatments and are still interested in pursing when he can. Therapeutic listening provided as she reflected over their hospital stay at [facility] and how this was not a good experience for them due to feeling like "no one was on the same page" and they kept getting "conflicting information" which they found frustrating and hard to navigate. Education provided that PMT can assist with pulling all the pieces together and helping make sense of what was happening - she is appreciative we have joined  patient's care.  We discussed patient's current illness and what it means in the larger context of patient's on-going co-morbidities. Adam Bender has a clear understanding of the patient's current medical situation. She understands that CHF is a progressive, non-curable disease underlying the patient's current acute medical conditions. We also discussed that it seems his cancer is not believed to be curable as well per palliative treatment recommendations. Natural disease trajectory and expectations at EOL were discussed. I attempted to elicit values and goals of care important to the patient. The difference between aggressive medical intervention and comfort care was considered in light of the patient's goals of care. Patient and wife are both clear that they are willing and wanting to pursue all recommended tests/procedures/treatments at this time.  Palliative Care services outpatient were explained and offered - they are agreeable.   Advance directives, concepts specific to code status, artificial feeding and hydration, and rehospitalization were considered and discussed. There is HCPOA documentation in ACP tab outlining patient's [name] Bender as primary HCPOA and [employer] as secondary. Encouraged patient/family to consider DNR/DNI status understanding evidenced based poor outcomes in similar hospitalized patient, as the cause of arrest is likely associated with advanced chronic/terminal illness rather than an easily reversible acute cardio-pulmonary event. I explained that DNR/DNI does not change the medical plan and it only comes into effect after a person has arrested (died).  It is a protective measure to keep [place] from harming the patient in their last moments of life. Patient/wife were not agreeable to DNR/DNI with understanding that he would receive CPR, defibrillation, ACLS medications, and intubation. They understand his prognosis would be poor in the event of a resuscitation event, but are clear in  desire to continue full code status.  Adam Bender asked if [facility] had any resources for financial hardship - specifically asking about charity care. I told her I would let our LCSW know of her question and request they provide her with financial resources. Therapeutic listening provided as she reflected on stress of medical bills that were "piling up."   Adam Bender also expressed concerns over having to wait until tomorrow to get MRI completed due to no WON working the weekend. We discussed why MRI was being delayed for WON - Adam Bender explains patient was not able to get MRI completed with wound vac in place and ED RN was waiting for WON to come and remove wound vac. Informed Adam Bender that I would look into this further.  Discussed with patient/family the importance of continued conversation with each other and the medical providers regarding overall plan of care and treatment options, ensuring decisions are within the context of the patient's values and GOCs.    Questions and concerns were addressed. The patient/family was encouraged to call with questions and/or concerns. PMT card was provided.  After visit with patient/wife  reviewed Rad. Tech. documentation that stated MRI could be completed if wound vac was disconnected. Also reviewed documentation from H&P which stated wound vac could be disconnected for a short time to get MRI. I personally went and spoke with Rad. Tech to verify that MRI could be completed by disconnecting the wound vac machine - verified that entire foam bandage did not have to be removed and Rad. Tech. confirmed this was correct as long as no metal was in tubing section attached to foam bandage. I went back to patient's room and informed Adam Bender of my discussion with Rad. Tech. I evaluated the wound vac and tubing - tubing could be extremely easy to disconnect from machine and no metal was noted around tubing once disconnected. I informed patient's primary RN that patient could in fact get his  MRI today without need to wait for WON. Explained that it just needed disconnecting and recommended to wrap open ends of tubing with sterile gauze simply to prevent tube contamination during disconnection period. Primary RN explained they have not had competencies on wound vac's and she was not able to disconnect or reconnect wound vac for MRI. I offered to show her the simple clip on tubing but she declined stating she felt uncomfortable. I asked to speak with ED Charge RN. Primary RN spoke with ED Charge and again I was told ED RNs were not able to disconnect or work with wound vacs. I then called the Nursing Supervisor(AC)/Lydia and explained the patient's situation and need for nurse to disconnect/reconnect wound vac so he could get MRI today. AC was extremely supportive. AC and I had discussion with ED Charge RN and primary RN and developed solution to call SWOT nurse for assistance with wound vac so patient could get MRI today. I greatly appreciate Lydia's assistance in resolving this concern. Adam Bender was extremely appreciative that patient does not have to wait until tomorrow to get MRI.   Primary Decision Maker: PATIENT , [name] is very involved in his care and recommend including her in all discussions, especially those around making decisions    SUMMARY OF RECOMMENDATIONS    Continue full scope medical treatment  Patient is open to all recommended test/procedures  Continue full code status  Patient/wife would like to get MRI results and updated Neuro recommendations before making further decisions  TOC consulted for: Outpatient Palliative Care referral and wife's request for financial hardship resources   Patient would qualify for hospice services, but at this time he still is interested in working with oncology and possibly starting radiation   Ongoing PMT discussions pending clinical course  PMT will continue to follow and support holistically   Code Status/Advance Care  Planning:  Full code  Palliative Prophylaxis:   Aspiration, Bowel Regimen, Delirium Protocol, Frequent Pain Assessment, Oral Care and Turn Reposition  Additional Recommendations (Limitations, Scope, Preferences):  Full Scope Treatment  Psycho-social/Spiritual:   Desire for further Chaplaincy support:no  Additional Recommendations: Medicaid/Financial Assistance  Created space and opportunity for patient and family to express thoughts and feelings regarding patient's current medical situation.   Emotional support and therapeutic listening provided.  Prognosis:  Unable to determine,  poor in the setting of malignant melanoma with brain mets and multiple recent hospitalizations with acute complications. He is high risk for rehospitalization   Discharge Planning: To Be Determined      Primary Diagnoses: Present on Admission: . Stroke ([place]) . Malignant melanoma ([place]) . Atrial fibrillation with rapid ventricular response ([place]) . Brain metastases ([place])  I have reviewed the medical record, interviewed the patient and family, and examined the patient. The following aspects are pertinent.  Past Medical History:  Diagnosis Date  . Atrial fibrillation ([place]) 02/29/2020  . Depressed left ventricular ejection fraction 02/29/2020  . Malignant melanoma of other part of trunk ([place]) 02/20/2020  . Malignant melanoma of skin of trunk, except scrotum ([facility])    Social History   Socioeconomic History  . Marital status: Married    Spouse name: Not on file  . Number of children: Not on file  . Years of education: Not on file  . Highest education level: Not on file  Occupational History  . Not on file  Tobacco Use  . Smoking status: Former Research scientist (life sciences)  . Smokeless tobacco: Never Used  Substance and Sexual Activity  . Alcohol use: Not on file  . Drug use: Not on file  . Sexual activity: Not on file  Other Topics Concern  . Not on file  Social History Narrative  . Not on file   Social  Determinants of Health   Financial Resource Strain: Not on file  Food Insecurity: Not on file  Transportation Needs: Not on file  Physical Activity: Not on file  Stress: Not on file  Social Connections: Not on file   Family History  Problem Relation Age of Onset  . Hypertension Mother   . Hypertension Father    Scheduled Meds: . amiodarone  200 mg Oral Daily  . aspirin  81 mg Oral Daily  . [START ON 07/24/2020] dexamethasone  2 mg Oral Daily  . digoxin  0.0625 mg Oral Daily  . diltiazem  180 mg Oral Daily  . gabapentin  100 mg Oral TID  . insulin aspart  0-9 Units Subcutaneous TID WC  . lidocaine  1 patch Transdermal Daily  . melatonin  3 mg Oral QHS  . metoprolol tartrate  50 mg Oral BID  . sodium chloride flush  3 mL Intravenous Once   Continuous Infusions: . heparin 1,100 Units/hr (07/23/20 1106)   PRN Meds:.acetaminophen, cyclobenzaprine, HYDROmorphone (DILAUDID) injection, ondansetron, oxyCODONE, senna-docusate Medications Prior to Admission:  Prior to Admission medications   Medication Sig Start Date End Date Taking? Authorizing Provider  acetaminophen (TYLENOL) 500 MG tablet Take 1,000 mg by mouth every 6 (six) hours as needed for mild pain or moderate pain.   Yes [provider]  amiodarone (PACERONE) 200 MG tablet Take 200 mg by mouth daily. 07/20/20  Yes [provider]  aspirin 81 MG chewable tablet Chew 81 mg by mouth daily. 07/20/20  Yes [provider]  benzonatate (TESSALON) 100 MG capsule Take 1 capsule by mouth three times daily as needed for cough 06/14/20  Yes Mosher, Vida Roller A, PA-C  cyclobenzaprine (FLEXERIL) 10 MG tablet Take 1 tablet (10 mg total) by mouth 3 (three) times daily as needed for muscle spasms. 06/30/20  Yes Derwood Kaplan, MD  dexamethasone (DECADRON) 2 MG tablet Take by mouth See admin instructions. 43m x 4 days, 2 mg x 7 days, 143mx 7 days 07/20/20 08/07/20 Yes [provider]  digoxin (LANOXIN) 0.125 MG  tablet Take 0.0625 mg by mouth daily. 07/20/20 07/20/21 Yes [provider]  diltiazem (TIAZAC) 120 MG 24 hr capsule Take 120 mg by mouth daily. 07/20/20 07/20/21 Yes [provider]  enoxaparin (LOVENOX) 60 MG/0.6ML injection Inject 60 mg into the skin in the morning and at bedtime. 07/20/20 10/18/20 Yes [provider]  gabapentin (NEURONTIN) 100 MG capsule  Take 100 mg by mouth 3 (three) times daily. 07/20/20 07/20/21 Yes [provider]  lidocaine (LIDODERM) 5 % Place 1 patch onto the skin See admin instructions. Apply to the most painful area for up to 12 hours in a 24 hour period 07/20/20 08/19/20 Yes [provider]  melatonin 3 MG TABS tablet Take 3 mg by mouth at bedtime. 07/20/20  Yes [provider]  metoprolol tartrate (LOPRESSOR) 50 MG tablet Take 1 tablet (50 mg total) by mouth 2 (two) times daily. 06/26/20  Yes Dayton Scrape A, NP  ondansetron (ZOFRAN ODT) 4 MG disintegrating tablet Take 1 tablet (4 mg total) by mouth every 4 (four) hours as needed for nausea or vomiting. 06/29/20  Yes Derwood Kaplan, MD  Oxycodone HCl 10 MG TABS Take 10 mg by mouth every 6 (six) hours as needed (pain). 07/20/20  Yes [provider]  prochlorperazine (COMPAZINE) 10 MG tablet Take 10 mg by mouth every 6 (six) hours as needed for nausea or vomiting.   Yes [provider]  SENOKOT S 8.6-50 MG tablet Take 2 tablets by mouth 2 (two) times daily. 07/20/20  Yes [provider]  digoxin (LANOXIN) 0.125 MG tablet Take 1 tablet (125 mcg total) by mouth daily. Patient not taking: No sig reported 03/28/20   Richardo Priest, MD  diltiazem (CARDIZEM) 30 MG tablet Take 1 tablet (30 mg total) by mouth 2 (two) times daily. Patient not taking: No sig reported 05/16/20   Richardo Priest, MD  ELIQUIS 2.5 MG TABS tablet Take 1 tablet (2.5 mg total) by mouth 2 (two) times daily. Patient not taking: No sig reported 04/25/20   [place]ton Scrape A, NP   empagliflozin (JARDIANCE) 10 MG TABS tablet Take 1 tablet (10 mg total) by mouth daily before breakfast. Patient not taking: No sig reported 03/02/20   Tobb, Kardie, DO  ENTRESTO 24-26 MG Take 1 tablet by mouth 2 (two) times daily. Patient not taking: No sig reported 05/19/20   [provider]  furosemide (LASIX) 20 MG tablet Take 1 tablet (20 mg total) by mouth daily. Patient not taking: Reported on 07/22/2020 03/24/20 06/22/20  Richardo Priest, MD  HYDROmorphone (DILAUDID) 2 MG tablet Take 1 tablet (2 mg total) by mouth every 4 (four) hours as needed for severe pain. Patient not taking: No sig reported 07/03/20   Rosanne Sack A, PA-C  potassium chloride SA (KLOR-CON) 20 MEQ tablet Take 1 tablet (20 mEq total) by mouth daily. Patient not taking: No sig reported 05/16/20   Richardo Priest, MD   No Known Allergies Review of Systems  Constitutional: Positive for fatigue.  Respiratory: Negative for shortness of breath.   Neurological: Positive for weakness and numbness.  All other systems reviewed and are negative.  Physical Exam Constitutional:      Appearance: He is ill-appearing.  Pulmonary:     Effort: No respiratory distress.  Skin:    General: Skin is warm and dry.     Coloration: Skin is pale.     Comments: Wound vac to right LE  Neurological:     Mental Status: He is alert and oriented to person, place, and time.     Motor: Weakness present.  Psychiatric:        Attention and Perception: He is inattentive.        Behavior: Behavior is cooperative.        Cognition and Memory: Cognition and memory normal.  Judgment: Judgment is impulsive.      Vital Signs: BP 105/63   Pulse 85   Temp 98.6 F (37 C) (Oral)   Resp 18   Ht '5\' 3"'  (1.6 m)   Wt 73.7 kg   SpO2 96%   BMI 28.79 kg/m  Pain Scale: 0-10   Pain Score: Asleep   SpO2: SpO2: 96 % O2 Device:SpO2: 96 % O2 Flow Rate: .   IO: Intake/output summary:   Intake/Output Summary (Last 24 hours) at  07/23/2020 1617 Last data filed at 07/23/2020 1221 Gross per 24 hour  Intake 247.84 ml  Output 400 ml  Net -152.16 ml    LBM:   Baseline Weight: Weight: 73.7 kg Most recent weight: Weight: 73.7 kg     Palliative Assessment/Data: PPS 40-50%     Time In: 1630 Time Out: 1820 Time Total: 110 minutes  Greater than 50%  of this time was spent counseling and coordinating care related to the above assessment and plan.  Signed by: Lin Landsman, NP   Please contact Palliative Medicine Team phone at [phone] for questions and concerns.  For individual provider: See Shea Evans

## 2020-07-23 NOTE — H&P (Addendum)
[facility] Admission History and Physical Service Pager: [phone]  Patient name: Adam Bender. Medical record number: [id number] Date of birth: [birth year] Age: 60 y.o. Gender: male  Primary Care Provider: Patient, No Pcp Per Consultants: Neuro Code Status: Full Preferred Emergency Contact: wife Shamond Skelton is [facility] POA  Chief Complaint: left sided weakness  Assessment and Plan: Murrel Bertram. is a 60 y.o. male presenting with slurred speech, left-sided facial droop, left-sided weakness. PMH is significant for metastatic malignant melanoma, brain metastases, atrial fibrillation with recent venous and arterial clot s/p vascular surgery 2 weeks ago.  Left sided weakness, 2/2 acute CVA Patient recently discharged from [facility] on 07/20/20 for thrombectomy, on lovenox. Tonight patient developed slurred speech, left facial droop, and left-sided weakness and numbness. He presented to [facility] where  CT head neg for acute finding, CTA/CTP shows right M3 branch occlusion with associated ischemia. Now patient is improving: slurred speech resolved, he has feeling again on left side, able to grossly move left arm and leg, still having trouble with moving digits. He reports numbness and tingling that is painful in left arm. Neurology consulted who note this could be embolic from A fib versus paradoxical embolus due to previous thrombus burden.  Recommended shorter acting agent given size of infarct but need for anticoagulation in the setting of hypercoagulable state.  Also recommended discussion with hematology in regards to future anticoagulation.  - admit to observation, Dr. Saul Fordyce service - vitals per floor protocol - neurology consulted, appreciate recs - heparin gtt per pharmacy - MRI - PT/OT/SLP - frequent neuro checks - passed bedside swallow on exam, heart healthy diet - hematology consult, likely 2/21 to discuss anticoagulation plan  Metastatic  melanoma Follows with Dr. Hinton Rao.  Metastases to brain, pulmonary nodules, left axillary nodules, on steroid taper, see below.  Radiation to left axilla was started during hospitalization at [facility] from 2/1-2/17, next date of radiation is 07/24/20. For pain management he takes oxycodone 10 mg q6hr prn, tylenol 1000 mg QID prn pain, gabapentin TID for nerve type pain in his legs, flexeril 10 mg TID for muscle spasms in his legs, lidoderm patch for his left axilla prn. He uses senna qd for constipation. While at [facility] he had intense pain and used a dilaudid PCA, his wife reports he was also given a dilaudid infusion with PCA on top and that resulted in an episode of him becoming snowed. She said that he responded well to PCA by itself and smaller doses of dilaudid. - decadron taper: 4 mg x4 days (last day of 4 mg is 07/23/20), then 2 mg x7 days (2/21-2/27), 1 mg x7days (2/28-3/6) - pain: oxycodone 10 mg q6h prn, tylenol, gabapentin, flexeril, lidoderm patch. Can add dilaudid prn if necessary for pain control, but will monitor with baseline medications for now.  Atrial Fibrillation Follows with Dr. Roetta Sessions.  Home meds: digoxin 0.0625mg  QD, diltiazem 120mg  QD (changed at [facility] during last hospitalization d/c'ed 2/17), amiodarone 200 mg qd, metoprolol 50mg  BID.  A fib here on admission, rate 115-130. Wife reports this is his baseline. Previously on Eliquis, but following admission at [facility], was discharged on ASA 81mg  and Lovenox 60mg  BID. - cont home rate and rhythm control - anticoagulation per above with increase of diltiazem to 180 mg qd  History of recurrent right iliac thrombus  S/p thrombectomy in fasciotomy on 07/04/2020 and repeat thrombectomy on 07/07/2020.  Patient was discharged from [facility] on 07/20/20 on aspirin 81 mg and  Lovenox 60 mg twice daily. - now on hep gtt per above  Leukocytosis WBC 24.6 on admission.  In Care Everywhere, appears to have been as high as 54.9 on 2/11 and slowly decreasing since  then.  On 2/17 was 26.7.  Likely in the setting of known metastatic melanoma and current steroid use (noted above).  Could also consider infectious etiology, however less likely given that he is afebrile and does not appear to have infectious symptoms.  We will go ahead and obtain blood cultures to rule out bacteremia, especially in the setting of recent vascular procedure. -Monitor CBC -f/u blood cultures x2  Normocytic Anemia Hgb 8.7 on admission, MCV 94.2.  Was 8.7 on last CBC on 2/17 on Care Everywhere.  Likely in the setting of recent vascular procedure and known metastatic cancer.  Transfusion threshold 8. - monitor CBC  Hypokalemia Potassium 3.2 on admission.  40 mEq given on admission. -Replete as needed -BMP and mag in a.m.  HFrEF Last Echo 06/17/2020, EF 17-51%, diastolic parameters indeterminate.  Home meds: metop per above.  No longer on jardiance 10mg , Lasix 20mg  QD, entresto 49/51 BID since [facility] admission. Unclear why these medications were discontinued. Also there is a note in Care Everywhere that patient had an ID consult for marantic endocarditis, but there are no other notes or labs to support endocarditis. Will continue with above care.  FEN/GI: Passed bedside swallow, heart healthy Prophylaxis: heparin gtt per above  Disposition: admit for observation pending medical workup and stabilization  History of Present Illness:  Adam Bender. is a 60 y.o. male presenting with left sided numbness, weakness, and slurred speech. Patient was in his usual state of health until 8 PM tonight when his wife noticed a sudden onset of slurred speech, left facial droop, and completely limp left arm and leg. Patient has metastatic melanoma and was just admitted to [facility] for 2 weeks for thrombectomy due to high clot burden. He was discharged on lovenox, which they have been taking daily. Patient presented to [facility] for stroke and has since improved with resolution of slurred speech, facial  droop, and inability to move left arm and leg. Presently, he can move his left arm grossly, but cannot move his fingers.   Review Of Systems: Per HPI with the following additions:   Review of Systems  Constitutional: Negative for activity change and fever.  Respiratory: Negative for cough and shortness of breath.   Gastrointestinal: Negative for abdominal pain, diarrhea, nausea and vomiting.  Skin: Positive for wound.  Neurological: Positive for facial asymmetry, speech difficulty, weakness and numbness.  Hematological: Positive for adenopathy.  Psychiatric/Behavioral: Negative for confusion and hallucinations. The patient is nervous/anxious.   All other systems reviewed and are negative.    Patient Active Problem List   Diagnosis Date Noted  . Nausea with vomiting 06/29/2020  . Swollen lymph nodes 06/26/2020  . Pulmonary nodules 05/10/2020    Class: Chronic  . Atrial fibrillation ([place]) 02/29/2020  . Depressed left ventricular ejection fraction 02/29/2020  . Malignant melanoma of other part of trunk ([place]) 02/20/2020  . Brain metastases ([place]) 01/11/2020    Class: Chronic  . Skin, metastatic cancer to [facility]) 01/11/2020    Class: Chronic    Past Medical History: Past Medical History:  Diagnosis Date  . Atrial fibrillation ([place]) 02/29/2020  . Depressed left ventricular ejection fraction 02/29/2020  . Malignant melanoma of other part of trunk ([place]) 02/20/2020  . Malignant melanoma of skin of trunk, except scrotum (  [place])     Past Surgical History: Past Surgical History:  Procedure Laterality Date  . MOLE REMOVAL    . SALIVARY GLAND SURGERY      Social History: Social History   Tobacco Use  . Smoking status: Former Research scientist (life sciences)  . Smokeless tobacco: Never Used   Additional social history: lives in [place], goes to Dr. Bettina Gavia at [facility] Please also refer to relevant sections of EMR.  Family History: Family History  Problem Relation Age of Onset  . Hypertension Mother   .  Hypertension Father     Allergies and Medications: No Known Allergies No current facility-administered medications on file prior to encounter.   Current Outpatient Medications on File Prior to Encounter  Medication Sig Dispense Refill  . acetaminophen (TYLENOL) 500 MG tablet Take 1,000 mg by mouth every 6 (six) hours as needed for mild pain or moderate pain.    Marland Kitchen amiodarone (PACERONE) 200 MG tablet Take 200 mg by mouth daily.    Marland Kitchen aspirin 81 MG chewable tablet Chew 81 mg by mouth daily.    . benzonatate (TESSALON) 100 MG capsule Take 1 capsule by mouth three times daily as needed for cough 90 capsule 0  . cyclobenzaprine (FLEXERIL) 10 MG tablet Take 1 tablet (10 mg total) by mouth 3 (three) times daily as needed for muscle spasms. 30 tablet 5  . dexamethasone (DECADRON) 2 MG tablet Take by mouth See admin instructions. 4mg  x 4 days, 2 mg x 7 days, 1mg  x 7 days    . digoxin (LANOXIN) 0.125 MG tablet Take 0.0625 mg by mouth daily.    Marland Kitchen diltiazem (TIAZAC) 120 MG 24 hr capsule Take 120 mg by mouth daily.    Marland Kitchen enoxaparin (LOVENOX) 60 MG/0.6ML injection Inject 60 mg into the skin in the morning and at bedtime.    . gabapentin (NEURONTIN) 100 MG capsule Take 100 mg by mouth 3 (three) times daily.    Marland Kitchen lidocaine (LIDODERM) 5 % Place 1 patch onto the skin See admin instructions. Apply to the most painful area for up to 12 hours in a 24 hour period    . melatonin 3 MG TABS tablet Take 3 mg by mouth at bedtime.    . metoprolol tartrate (LOPRESSOR) 50 MG tablet Take 1 tablet (50 mg total) by mouth 2 (two) times daily. 60 tablet 5  . ondansetron (ZOFRAN ODT) 4 MG disintegrating tablet Take 1 tablet (4 mg total) by mouth every 4 (four) hours as needed for nausea or vomiting. 30 tablet 5  . Oxycodone HCl 10 MG TABS Take 10 mg by mouth every 6 (six) hours as needed (pain).    . prochlorperazine (COMPAZINE) 10 MG tablet Take 10 mg by mouth every 6 (six) hours as needed for nausea or vomiting.    Donavan Burnet S  8.6-50 MG tablet Take 2 tablets by mouth 2 (two) times daily.    . digoxin (LANOXIN) 0.125 MG tablet Take 1 tablet (125 mcg total) by mouth daily. (Patient not taking: No sig reported) 30 tablet 5  . diltiazem (CARDIZEM) 30 MG tablet Take 1 tablet (30 mg total) by mouth 2 (two) times daily. (Patient not taking: No sig reported) 180 tablet 3  . ELIQUIS 2.5 MG TABS tablet Take 1 tablet (2.5 mg total) by mouth 2 (two) times daily. (Patient not taking: No sig reported) 60 tablet 2  . empagliflozin (JARDIANCE) 10 MG TABS tablet Take 1 tablet (10 mg total) by mouth daily before breakfast. (Patient not  taking: No sig reported) 30 tablet 6  . ENTRESTO 24-26 MG Take 1 tablet by mouth 2 (two) times daily. (Patient not taking: No sig reported)    . furosemide (LASIX) 20 MG tablet Take 1 tablet (20 mg total) by mouth daily. (Patient not taking: Reported on 07/22/2020) 90 tablet 3  . HYDROmorphone (DILAUDID) 2 MG tablet Take 1 tablet (2 mg total) by mouth every 4 (four) hours as needed for severe pain. (Patient not taking: No sig reported) 100 tablet 0  . potassium chloride SA (KLOR-CON) 20 MEQ tablet Take 1 tablet (20 mEq total) by mouth daily. (Patient not taking: No sig reported) 90 tablet 3    Objective: BP 124/72   Pulse (!) 110   Temp 98.6 F (37 C) (Oral)   Resp (!) 26   Ht 5\' 3"  (1.6 m)   Wt 73.7 kg   SpO2 99%   BMI 28.79 kg/m  Exam: General: appears older than stated age, WM, NAD, tired-appearing and restless Eyes: anicteric sclerae, PERRLA ENTM: MMM, clear oropharynx Neck: supple, no LAD Left Axilla: + LAD and large mass with induration Cardiovascular: tachycardic rate, regular rhythm, no m/r/g, 2+ radial pulses Respiratory: CTAB, no increased WOB, no w/r/r Gastrointestinal: soft, NT, ND, normoactive bowel sounds MSK:  Derm: multiple black nodules 3-17mm in size on b/l forearms, scalp; wound vac on right lower leg, in place, no erythema, induration or discharge; staples in place in right  inguinal canal with appropriate healing erythema, not warm to touch, no pustulant drainage Neuro: Cranial Nerves II: PERRL.  III,IV, VI: EOMI without ptosis or diplopia.  V: Facial sensation is symmetric to temperature VII: Facial movement is symmetric.  VIII: hearing is intact to voice X: Palate elevates symmetrically XI: Shoulder shrug is symmetric. XII: tongue is midline without atrophy or fasciculations.  Motor: Tone is normal. Bulk is normal. 2/5 strength present in left digits, 3/5 strength in LUE, 4/5 strength in LLE Sensory: Sensation is symmetric to light touch in the arms and legs. Deep Tendon Reflexes: 2+ and symmetric in the biceps and patellae.  Psych: appropriate responses, mildly agitated, very worried about pain and tingling/inability to feel left fingers  Labs and Imaging: CBC BMET  Recent Labs  Lab 07/22/20 2200 07/22/20 2212  WBC 24.6*  --   HGB 8.7* 9.5*  HCT 29.0* 28.0*  PLT 245  --    Recent Labs  Lab 07/22/20 2200 07/22/20 2212  NA 134* 135  K 3.2* 3.3*  CL 100 99  CO2 22  --   BUN 13 13  CREATININE 0.72 0.60*  GLUCOSE 213* 212*  CALCIUM 8.0*  --      EKG: My own interpretation (not copied from electronic read): atrial fibrillation  CT Code Stroke CTA Head W/WO contrast  Result Date: 07/22/2020 CLINICAL DATA:  Metastatic disease to the brain. Left-sided weakness. Speech disturbance. EXAM: CT ANGIOGRAPHY HEAD AND NECK CT PERFUSION BRAIN TECHNIQUE: Multidetector CT imaging of the head and neck was performed using the standard protocol during bolus administration of intravenous contrast. Multiplanar CT image reconstructions and MIPs were obtained to evaluate the vascular anatomy. Carotid stenosis measurements (when applicable) are obtained utilizing NASCET criteria, using the distal internal carotid diameter as the denominator. Multiphase CT imaging of the brain was performed following IV bolus contrast injection. Subsequent parametric perfusion maps  were calculated using RAPID software. CONTRAST:  136mL OMNIPAQUE IOHEXOL 350 MG/ML SOLN COMPARISON:  Head CT earlier same day.  MRI 06/15/2020. FINDINGS: CTA NECK FINDINGS  Aortic arch: Normal.  No atherosclerotic change. Right carotid system: Common carotid artery widely patent to the bifurcation. Carotid bifurcation is normal without stenosis or irregularity. Cervical ICA widely patent. Left carotid system: Common carotid artery widely patent to the bifurcation. Carotid bifurcation is normal. Cervical ICA is normal. Vertebral arteries: Both vertebral artery origins are widely patent. Both vertebral arteries appear normal through the cervical region to the foramen magnum. Skeleton: Mid cervical spondylosis. Other neck: No neck mass identified. Upper chest: Hilar and mediastinal lymphadenopathy. Review of the MIP images confirms the above findings CTA HEAD FINDINGS Anterior circulation: Both internal carotid arteries are patent through the skull base and siphon regions. The left anterior circulation is normal. On the right, there is an occluded distal M3 branch in the inferior parietal region. Posterior circulation: Both vertebral arteries widely patent to the basilar. No basilar stenosis. Posterior circulation branch vessels are patent. Patent bilateral posterior communicating arteries. Venous sinuses: Patent and normal. Anatomic variants: None significant. Review of the MIP images confirms the above findings CT Brain Perfusion Findings: ASPECTS: 10 on the initial head CT reading. With the benefit the perfusion imaging, there could be a score of 9 awarded, with slight loss of gray-white differentiation at the inferior right parietal region. CBF (<30%) Volume: 2mL Perfusion (Tmax>6.0s) volume: 62mL Mismatch Volume: 16mL Infarction Location:Inferior right parietal lobe. IMPRESSION: 1. Occlusion of a distal right M3 branch in the inferior right parietal region. 9 mL core infarction with slight loss of gray-white  differentiation at the inferior right parietal region. 19 CC region of at risk brain with a mismatch volume of 19 mL. 2. No carotid bifurcation disease. No visible aortic atherosclerosis. 3. Hilar and mediastinal lymphadenopathy consistent with the diagnosis of metastatic disease. Electronically Signed   By: Nelson Chimes M.D.   On: 07/22/2020 22:50   CT Code Stroke CTA Neck W/WO contrast  Result Date: 07/22/2020 CLINICAL DATA:  Metastatic disease to the brain. Left-sided weakness. Speech disturbance. EXAM: CT ANGIOGRAPHY HEAD AND NECK CT PERFUSION BRAIN TECHNIQUE: Multidetector CT imaging of the head and neck was performed using the standard protocol during bolus administration of intravenous contrast. Multiplanar CT image reconstructions and MIPs were obtained to evaluate the vascular anatomy. Carotid stenosis measurements (when applicable) are obtained utilizing NASCET criteria, using the distal internal carotid diameter as the denominator. Multiphase CT imaging of the brain was performed following IV bolus contrast injection. Subsequent parametric perfusion maps were calculated using RAPID software. CONTRAST:  149mL OMNIPAQUE IOHEXOL 350 MG/ML SOLN COMPARISON:  Head CT earlier same day.  MRI 06/15/2020. FINDINGS: CTA NECK FINDINGS Aortic arch: Normal.  No atherosclerotic change. Right carotid system: Common carotid artery widely patent to the bifurcation. Carotid bifurcation is normal without stenosis or irregularity. Cervical ICA widely patent. Left carotid system: Common carotid artery widely patent to the bifurcation. Carotid bifurcation is normal. Cervical ICA is normal. Vertebral arteries: Both vertebral artery origins are widely patent. Both vertebral arteries appear normal through the cervical region to the foramen magnum. Skeleton: Mid cervical spondylosis. Other neck: No neck mass identified. Upper chest: Hilar and mediastinal lymphadenopathy. Review of the MIP images confirms the above findings CTA  HEAD FINDINGS Anterior circulation: Both internal carotid arteries are patent through the skull base and siphon regions. The left anterior circulation is normal. On the right, there is an occluded distal M3 branch in the inferior parietal region. Posterior circulation: Both vertebral arteries widely patent to the basilar. No basilar stenosis. Posterior circulation branch vessels are patent. Patent bilateral  posterior communicating arteries. Venous sinuses: Patent and normal. Anatomic variants: None significant. Review of the MIP images confirms the above findings CT Brain Perfusion Findings: ASPECTS: 10 on the initial head CT reading. With the benefit the perfusion imaging, there could be a score of 9 awarded, with slight loss of gray-white differentiation at the inferior right parietal region. CBF (<30%) Volume: 34mL Perfusion (Tmax>6.0s) volume: 27mL Mismatch Volume: 28mL Infarction Location:Inferior right parietal lobe. IMPRESSION: 1. Occlusion of a distal right M3 branch in the inferior right parietal region. 9 mL core infarction with slight loss of gray-white differentiation at the inferior right parietal region. 19 CC region of at risk brain with a mismatch volume of 19 mL. 2. No carotid bifurcation disease. No visible aortic atherosclerosis. 3. Hilar and mediastinal lymphadenopathy consistent with the diagnosis of metastatic disease. Electronically Signed   By: Nelson Chimes M.D.   On: 07/22/2020 22:50   CT Code Stroke Cerebral Perfusion with contrast  Result Date: 07/22/2020 CLINICAL DATA:  Metastatic disease to the brain. Left-sided weakness. Speech disturbance. EXAM: CT ANGIOGRAPHY HEAD AND NECK CT PERFUSION BRAIN TECHNIQUE: Multidetector CT imaging of the head and neck was performed using the standard protocol during bolus administration of intravenous contrast. Multiplanar CT image reconstructions and MIPs were obtained to evaluate the vascular anatomy. Carotid stenosis measurements (when applicable)  are obtained utilizing NASCET criteria, using the distal internal carotid diameter as the denominator. Multiphase CT imaging of the brain was performed following IV bolus contrast injection. Subsequent parametric perfusion maps were calculated using RAPID software. CONTRAST:  171mL OMNIPAQUE IOHEXOL 350 MG/ML SOLN COMPARISON:  Head CT earlier same day.  MRI 06/15/2020. FINDINGS: CTA NECK FINDINGS Aortic arch: Normal.  No atherosclerotic change. Right carotid system: Common carotid artery widely patent to the bifurcation. Carotid bifurcation is normal without stenosis or irregularity. Cervical ICA widely patent. Left carotid system: Common carotid artery widely patent to the bifurcation. Carotid bifurcation is normal. Cervical ICA is normal. Vertebral arteries: Both vertebral artery origins are widely patent. Both vertebral arteries appear normal through the cervical region to the foramen magnum. Skeleton: Mid cervical spondylosis. Other neck: No neck mass identified. Upper chest: Hilar and mediastinal lymphadenopathy. Review of the MIP images confirms the above findings CTA HEAD FINDINGS Anterior circulation: Both internal carotid arteries are patent through the skull base and siphon regions. The left anterior circulation is normal. On the right, there is an occluded distal M3 branch in the inferior parietal region. Posterior circulation: Both vertebral arteries widely patent to the basilar. No basilar stenosis. Posterior circulation branch vessels are patent. Patent bilateral posterior communicating arteries. Venous sinuses: Patent and normal. Anatomic variants: None significant. Review of the MIP images confirms the above findings CT Brain Perfusion Findings: ASPECTS: 10 on the initial head CT reading. With the benefit the perfusion imaging, there could be a score of 9 awarded, with slight loss of gray-white differentiation at the inferior right parietal region. CBF (<30%) Volume: 53mL Perfusion (Tmax>6.0s) volume:  69mL Mismatch Volume: 35mL Infarction Location:Inferior right parietal lobe. IMPRESSION: 1. Occlusion of a distal right M3 branch in the inferior right parietal region. 9 mL core infarction with slight loss of gray-white differentiation at the inferior right parietal region. 19 CC region of at risk brain with a mismatch volume of 19 mL. 2. No carotid bifurcation disease. No visible aortic atherosclerosis. 3. Hilar and mediastinal lymphadenopathy consistent with the diagnosis of metastatic disease. Electronically Signed   By: Nelson Chimes M.D.   On: 07/22/2020 22:50  DG Chest Port 1 View  Result Date: 07/22/2020 CLINICAL DATA:  Weakness EXAM: PORTABLE CHEST 1 VIEW COMPARISON:  07/04/2020 FINDINGS: There is cardiomegaly. The previously demonstrated axillary and hilar adenopathy is better visualized on prior CT scans. Both lungs are clear. The visualized skeletal structures are unremarkable. There is a well-positioned right-sided Port-A-Cath. IMPRESSION: No definite acute cardiopulmonary process. Electronically Signed   By: Constance Holster M.D.   On: 07/22/2020 22:52   CT HEAD CODE STROKE WO CONTRAST  Result Date: 07/22/2020 CLINICAL DATA:  Code stroke. Left-sided weakness. Metastatic disease EXAM: CT HEAD WITHOUT CONTRAST TECHNIQUE: Contiguous axial images were obtained from the base of the skull through the vertex without intravenous contrast. COMPARISON:  01/05/2020.  MRI 06/15/2020 FINDINGS: Brain: Known bilateral temporal lobe metastatic disease. Subtle architectural distortion of the inferior temporal lobe on the right. Some volume loss and distortion of the inferior and anterior left temporal lobe. Other tiny metastases previously seen are not specifically appreciable. There is no sign of acute infarction, hydrocephalus, hemorrhage or extra-axial fluid collection. Vascular: No abnormal vascular finding. Skull: Normal Sinuses/Orbits: Sinuses are clear.  Orbits are negative. Other: None ASPECTS  ([place] Stroke Program Early CT Score) - Ganglionic level infarction (caudate, lentiform nuclei, internal capsule, insula, M1-M3 cortex): 7 - Supraganglionic infarction (M4-M6 cortex): 3 Total score (0-10 with 10 being normal): 10 IMPRESSION: 1. Known bilateral temporal lobe metastatic disease. Subtle architectural distortion of the inferior temporal lobe on the right. Some volume loss and distortion of the inferior and anterior left temporal lobe. Other tiny metastases previously seen by MRI are not specifically appreciable. No acute finding by CT. 2. ASPECTS is 10. 3. These results were communicated to Dr. Leonel Ramsay at 10:19 pmon 2/19/2022by text page via the [facility] messaging system. Electronically Signed   By: Nelson Chimes M.D.   On: 07/22/2020 22:20   Gladys Damme, MD 07/23/2020, 12:07 AM PGY-2, [place] Intern pager: [phone], text pages welcome

## 2020-07-23 NOTE — Progress Notes (Signed)
Attempted MRI, when pt arrived we noticed a wound vac attached. I called to notify the RN that we cannot scan the pt until the wound vac is either disconnected or removed.

## 2020-07-23 NOTE — ED Notes (Signed)
Pt back from MRI and connected back to wound vac by wife at the bedside.

## 2020-07-23 NOTE — ED Notes (Signed)
Pts son at bedside, brought charger for wound vac, wound vac plugged in charger and now operating.

## 2020-07-23 NOTE — ED Notes (Signed)
Pt has afib with rvr, previous nurse mentioned md is aware and plan of care to start cardizem in the morning.

## 2020-07-23 NOTE — ED Notes (Signed)
Paged FM to [employer]

## 2020-07-23 NOTE — ED Notes (Signed)
Pt with present on ED with a wound vac from outside the hospital, pt awaiting for wound care consult, no wound care RN available on the ED. Palliative NP requesting for wound vac to be disconnected on the ED for MRI, [facility] CN consulted about this and stayed that we can disconnected here on the ED, but wound care nurse will have to come and connected back when available, no RN on the ED trained for wound vac care at this time. Palliative care notified about this and she requested [facility] number and given.

## 2020-07-23 NOTE — Progress Notes (Signed)
ANTICOAGULATION CONSULT NOTE  Pharmacy Consult for Heparin Indication: atrial fibrillation, recent venous and arterial clot, acute CVA   Patient Measurements: Height: 5\' 3"  (160 cm) Weight: 73.7 kg (162 lb 8 oz) IBW/kg (Calculated) : 56.9 Heparin Dosing Weight: TBW  Vital Signs: Temp: 98.6 F (37 C) (02/19 2148) Temp Source: Oral (02/19 2148) BP: 128/73 (02/20 0815) Pulse Rate: 140 (02/20 0815)  Labs: Recent Labs    07/22/20 2200 07/22/20 2212 07/23/20 0310 07/23/20 0807  HGB 8.7* 9.5* 7.9*  --   HCT 29.0* 28.0* 25.0*  --   PLT 245  --  201  --   APTT 28  --   --   --   LABPROT 13.0  --   --   --   INR 1.0  --   --   --   HEPARINUNFRC  --   --   --  0.21*  CREATININE 0.72 0.60* 0.68  --     Estimated Creatinine Clearance: 89.4 mL/min (by C-G formula based on SCr of 0.68 mg/dL).   Assessment: 60 y.o. M presented as code stroke. Patient was on Lovenox 60mg  SQ q12h PTA for atrial fibrillation with recent venous and arterial clot status post vascular surgery 2 weeks ago. Last dose of Lovenox given 2/19 0930. Pharmacy was consulted to start IV heparin per pharmacy with lower goal rate of 0.3-0.5 units/ml (neurology approved).  Initial heparin level is low at 0.21 for goal on 900 units/hr.  Hgb down to 7.9 (prior 8.7). Platelets 201.   Goal of Therapy:  Heparin level 0.3-0.5 units/ml Monitor platelets by anticoagulation protocol: Yes   Plan:  Increase Heparin gtt to 1100 units/hr. No bolus.  Will f/u 8hr heparin level Daily heparin level and CBC  Sherlon Handing, PharmD, BCPS Please see amion for complete clinical pharmacist phone list 07/23/2020,8:59 AM

## 2020-07-23 NOTE — Progress Notes (Signed)
ANTICOAGULATION CONSULT NOTE  Pharmacy Consult for Heparin Indication: atrial fibrillation, recent venous and arterial clot, acute CVA   Patient Measurements: Height: 5\' 3"  (160 cm) Weight: 73.7 kg (162 lb 8 oz) IBW/kg (Calculated) : 56.9 Heparin Dosing Weight: TBW  Vital Signs: BP: 112/72 (02/20 1745) Pulse Rate: 85 (02/20 1745)  Labs: Recent Labs    07/22/20 2200 07/22/20 2212 07/23/20 0310 07/23/20 0807 07/23/20 1711  HGB 8.7* 9.5* 7.9*  --  8.9*  HCT 29.0* 28.0* 25.0*  --  28.9*  PLT 245  --  201  --   --   APTT 28  --   --   --   --   LABPROT 13.0  --   --   --   --   INR 1.0  --   --   --   --   HEPARINUNFRC  --   --   --  0.21* 0.13*  CREATININE 0.72 0.60* 0.68  --  0.74    Estimated Creatinine Clearance: 89.4 mL/min (by C-G formula based on SCr of 0.74 mg/dL).   Assessment: 60 y.o. M presented as code stroke. Patient was on Lovenox 60mg  SQ q12h PTA for atrial fibrillation with recent venous and arterial clot status post vascular surgery 2 weeks ago. Last dose of Lovenox given 2/19 0930. Pharmacy was consulted to start IV heparin per pharmacy with lower goal rate of 0.3-0.5 units/ml (neurology approved).  PM f/u > heparin level low despite rate increase earlier today.  Per RN no known interruptions to IV heparin prior to level being drawn.  No overt bleeding or complications noted.  Goal of Therapy:  Heparin level 0.3-0.5 units/ml Monitor platelets by anticoagulation protocol: Yes   Plan:  Increase Heparin gtt to 1300 units/hr. No bolus.  Will f/u 6 hr heparin level Daily heparin level and CBC  [place] Crane, Roylene Reason, [facility] Clinical Pharmacist  07/23/2020 6:28 PM   [facility] pharmacy phone numbers are listed on [email or web address]

## 2020-07-23 NOTE — ED Notes (Signed)
Service Resource called @ 1803-Dinner Tray Ordered. 

## 2020-07-23 NOTE — ED Notes (Signed)
Pt taken to MRI at this time

## 2020-07-23 NOTE — ED Notes (Signed)
Pt wound vac battery is dying. Called spouse laura and she is aware. Pts son is on the way with a battery.

## 2020-07-23 NOTE — Plan of Care (Incomplete)
  Temp:  [98.6 F (37 C)] 98.6 F (37 C) (02/19 2148) Pulse Rate:  [25-140] 85 (02/20 1530) Resp:  [15-26] 18 (02/20 1530) BP: (105-132)/(62-85) 105/63 (02/20 1530) SpO2:  [77 %-100 %] 96 % (02/20 1530) Weight:  [73.7 kg] 73.7 kg (02/19 2236)  General - Well nourished, well developed, in no apparent distress.  Ophthalmologic - fundi not visualized due to noncooperation.  Cardiovascular - Regular rhythm and rate.  Mental Status -  Level of arousal and orientation to time, place, and person were intact. Language including expression, naming, repetition, comprehension was assessed and found intact.  Cranial Nerves II - XII - II - Visual field testing showed left lower quadrantanopia, left upper quadrant simutalgnosia. III, IV, VI - Extraocular movements intact. V - Facial sensation intact bilaterally. VII - mild left facial droop. VIII - Hearing & vestibular intact bilaterally. X - Palate elevates symmetrically XI - Chin turning & shoulder shrug intact bilaterally. XII - Tongue protrusion intact.  Motor Strength - The patient's strength was normal in RUE and RLE, however, left UE proximal 3-/5 and distal 3/5. Left LE 5-/5 proximal and distal and pronator drift was absent.  Bulk was normal and fasciculations were absent.   Motor Tone - Muscle tone was assessed at the neck and appendages and was normal.  Reflexes - The patient's reflexes were symmetrical in all extremities and he had no pathological reflexes.  Sensory - Light touch, temperature/pinprick were assessed and were symmetrical.    Coordination - The patient had normal movements in the right hand with no ataxia or dysmetria.  Tremor was absent.  Gait and Station - deferred.

## 2020-07-23 NOTE — ED Notes (Signed)
Lunch Tray Ordered @ 1016. 

## 2020-07-23 NOTE — Hospital Course (Addendum)
Adam Bender. is a 60 y.o. male presenting with slurred speech, left-sided facial droop, left-sided weakness. PMH is significant for metastatic malignant melanoma, brain metastases, atrial fibrillation with recent venous and arterial clot s/p vascular surgery 2/1 and 2/4.  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 Concern for acute limb ischemia, CTA lower extremities showed left iliac artery thrombus with additional significant clot burden.  Heparin drip throughout admission. Vascular surgery performed right iliac embolectomy with stent placement 2/22. Received 2u PRBCs for blood loss during surgery. Patient tolerated procedure well.   Left-Sided Weakness 2/2 Acute CVA Patient recently discharged from [place] on 07/20/20 for thrombectomy, on lovenox. On night of admission patient developed slurred speech, left facial droop, and left-sided weakness and numbness. He presented to [facility] where CT head neg for acute finding, CTA/CTP showed right M3 branch occlusion with associated ischemia. MRI of the brain showed multiple acute right MCA territory infarcts, largest in the temporoparietal region. Minimally increased size of 4 mm right frontal operculum metastasis.  Neurology was consulted who noted this could be embolic from A fib versus paradoxical embolus due to previous thrombus burden. Recommended shorter acting agent given size of infarct but need for anticoagulation in the setting of hypercoagulable state.  Hematology-oncology recommends lovenox 80mg  BID and ASA 81 as an outpatient. Heparin drip was started on admission and stopped 2/21 for 1 dose of lovenox 80, but was restarted on heparin drip after found to have clot burden as above.  Speech and lower extremity weakness  improved, but LUE continues to remain weak. ***  Metastatic Melanoma Follows with Dr. Hinton Rao. Metastases to brain, pulmonary nodules, left axillary nodules. Steroid taper: 2 mg x7 days (2/21-2/27), 1 mg x7days (2/28-3/6). Receiving radiation to left axilla. For pain management he was continued on his home regimen.

## 2020-07-24 ENCOUNTER — Inpatient Hospital Stay (HOSPITAL_COMMUNITY): Payer: 59

## 2020-07-24 DIAGNOSIS — I745 Embolism and thrombosis of iliac artery: Secondary | ICD-10-CM

## 2020-07-24 DIAGNOSIS — I4891 Unspecified atrial fibrillation: Secondary | ICD-10-CM | POA: Diagnosis not present

## 2020-07-24 DIAGNOSIS — Z515 Encounter for palliative care: Secondary | ICD-10-CM

## 2020-07-24 DIAGNOSIS — I998 Other disorder of circulatory system: Secondary | ICD-10-CM

## 2020-07-24 DIAGNOSIS — R200 Anesthesia of skin: Secondary | ICD-10-CM | POA: Diagnosis not present

## 2020-07-24 DIAGNOSIS — Z7189 Other specified counseling: Secondary | ICD-10-CM

## 2020-07-24 DIAGNOSIS — I639 Cerebral infarction, unspecified: Secondary | ICD-10-CM | POA: Diagnosis not present

## 2020-07-24 LAB — BASIC METABOLIC PANEL
Anion gap: 10 (ref 5–15)
Anion gap: 10 (ref 5–15)
Anion gap: 10 (ref 5–15)
BUN: 15 mg/dL (ref 6–20)
BUN: 16 mg/dL (ref 6–20)
BUN: 16 mg/dL (ref 6–20)
CO2: 21 mmol/L — ABNORMAL LOW (ref 22–32)
CO2: 22 mmol/L (ref 22–32)
CO2: 23 mmol/L (ref 22–32)
Calcium: 8.2 mg/dL — ABNORMAL LOW (ref 8.9–10.3)
Calcium: 8.1 mg/dL — ABNORMAL LOW (ref 8.9–10.3)
Calcium: 8 mg/dL — ABNORMAL LOW (ref 8.9–10.3)
Chloride: 98 mmol/L (ref 98–111)
Chloride: 100 mmol/L (ref 98–111)
Chloride: 98 mmol/L (ref 98–111)
Creatinine, Ser: 0.76 mg/dL (ref 0.61–1.24)
Creatinine, Ser: 0.8 mg/dL (ref 0.61–1.24)
Creatinine, Ser: 0.82 mg/dL (ref 0.61–1.24)
GFR, Estimated: >60 mL/min (ref >60)
GFR, Estimated: >60 mL/min (ref >60)
GFR, Estimated: >60 mL/min (ref >60)
Glucose, Bld: 131 mg/dL — ABNORMAL HIGH (ref 70–99)
Glucose, Bld: 158 mg/dL — ABNORMAL HIGH (ref 70–99)
Glucose, Bld: 147 mg/dL — ABNORMAL HIGH (ref 70–99)
Potassium: 4.2 mmol/L (ref 3.5–5.1)
Potassium: 4.3 mmol/L (ref 3.5–5.1)
Potassium: 4.3 mmol/L (ref 3.5–5.1)
Sodium: 129 mmol/L — ABNORMAL LOW (ref 135–145)
Sodium: 132 mmol/L — ABNORMAL LOW (ref 135–145)
Sodium: 131 mmol/L — ABNORMAL LOW (ref 135–145)

## 2020-07-24 LAB — CBC
HCT: 27.1 % — ABNORMAL LOW (ref 39.0–52.0)
HCT: 26.4 % — ABNORMAL LOW (ref 39.0–52.0)
Hemoglobin: 8.6 g/dL — ABNORMAL LOW (ref 13.0–17.0)
Hemoglobin: 8.4 g/dL — ABNORMAL LOW (ref 13.0–17.0)
MCH: 29.2 pg (ref 26.0–34.0)
MCH: 28.4 pg (ref 26.0–34.0)
MCHC: 31.7 g/dL (ref 30.0–36.0)
MCHC: 31.8 g/dL (ref 30.0–36.0)
MCV: 91.9 fL (ref 80.0–100.0)
MCV: 89.2 fL (ref 80.0–100.0)
Platelets: 254 10*3/uL (ref 150–400)
Platelets: 256 10*3/uL (ref 150–400)
RBC: 2.95 MIL/uL — ABNORMAL LOW (ref 4.22–5.81)
RBC: 2.96 MIL/uL — ABNORMAL LOW (ref 4.22–5.81)
RDW: 14.9 % (ref 11.5–15.5)
RDW: 14.6 % (ref 11.5–15.5)
WBC: 22.4 10*3/uL — ABNORMAL HIGH (ref 4.0–10.5)
WBC: 20.3 10*3/uL — ABNORMAL HIGH (ref 4.0–10.5)
nRBC: 0 % (ref 0.0–0.2)
nRBC: 0 % (ref 0.0–0.2)

## 2020-07-24 LAB — CBG MONITORING, ED
Glucose-Capillary: 92 mg/dL (ref 70–99)
Glucose-Capillary: 124 mg/dL — ABNORMAL HIGH (ref 70–99)

## 2020-07-24 LAB — LIPID PANEL
Cholesterol: 127 mg/dL (ref 0–200)
HDL: 33 mg/dL — ABNORMAL LOW (ref >40)
LDL Cholesterol: 65 mg/dL (ref 0–99)
Total CHOL/HDL Ratio: 3.8 RATIO
Triglycerides: 145 mg/dL (ref <150)
VLDL: 29 mg/dL (ref 0–40)

## 2020-07-24 LAB — GLUCOSE, CAPILLARY
Glucose-Capillary: 140 mg/dL — ABNORMAL HIGH (ref 70–99)
Glucose-Capillary: 163 mg/dL — ABNORMAL HIGH (ref 70–99)

## 2020-07-24 LAB — HEPARIN LEVEL (UNFRACTIONATED): Heparin Unfractionated: 0.2 IU/mL — ABNORMAL LOW (ref 0.30–0.70)

## 2020-07-24 LAB — LACTIC ACID, PLASMA
Lactic Acid, Venous: 2.1 mmol/L (ref 0.5–1.9)
Lactic Acid, Venous: 2.1 mmol/L (ref 0.5–1.9)

## 2020-07-24 LAB — HEMOGLOBIN A1C
Hgb A1c MFr Bld: 6.4 % — ABNORMAL HIGH (ref 4.8–5.6)
Mean Plasma Glucose: 136.98 mg/dL

## 2020-07-24 MED ORDER — HEPARIN (PORCINE) 25000 UT/250ML-% IV SOLN
1850.0000 [IU]/h | INTRAVENOUS | Status: AC
  Administered 2020-07-24 – 2020-07-25 (×2): 1400 [IU]/h via INTRAVENOUS
  Administered 2020-07-27: 1750 [IU]/h via INTRAVENOUS
  Administered 2020-07-27: 1600 [IU]/h via INTRAVENOUS
  Filled 2020-07-24 (×5): qty 250

## 2020-07-24 MED ORDER — HYDROMORPHONE HCL 1 MG/ML IJ SOLN
1.0000 mg | Freq: Once | INTRAMUSCULAR | Status: AC
  Administered 2020-07-24: 1 mg via INTRAVENOUS
  Filled 2020-07-24: qty 1

## 2020-07-24 MED ORDER — HYDROMORPHONE HCL 1 MG/ML IJ SOLN
INTRAMUSCULAR | Status: AC
  Filled 2020-07-24: qty 1

## 2020-07-24 MED ORDER — IOHEXOL 350 MG/ML SOLN
99.0000 mL | Freq: Once | INTRAVENOUS | Status: AC | PRN
  Administered 2020-07-24: 99 mL via INTRAVENOUS

## 2020-07-24 MED ORDER — DEXAMETHASONE 2 MG PO TABS
2.0000 mg | ORAL_TABLET | Freq: Every day | ORAL | Status: AC
  Administered 2020-07-24 – 2020-07-30 (×7): 2 mg via ORAL
  Filled 2020-07-24 (×7): qty 1

## 2020-07-24 MED ORDER — ENOXAPARIN SODIUM 80 MG/0.8ML ~~LOC~~ SOLN
80.0000 mg | Freq: Two times a day (BID) | SUBCUTANEOUS | Status: DC
  Administered 2020-07-24: 80 mg via SUBCUTANEOUS
  Filled 2020-07-24: qty 0.8

## 2020-07-24 MED ORDER — DEXAMETHASONE 0.5 MG PO TABS: 1.0000 mg | ORAL_TABLET | Freq: Every day | ORAL | Status: DC

## 2020-07-24 NOTE — Progress Notes (Signed)
Daily Progress Note   Patient Name: Adam Bender.       Date: 07/24/2020 DOB: 1960/07/07  Age: 60 y.o. MRN#: [id number] Attending Physician: Martyn Malay, MD Primary Care Physician: Patient, No Pcp Per Admit Date: 07/22/2020  Reason for Follow-up: continued [place] discussions  Subjective: Upon walking into room and introducing myself, patient is moaning in pain and wife Adam Bender) is placing heat pads on his right foot. On assessing his right lower extremity, the lateral aspect of his right ankle is dusky blue in color. Also slighty cool to touch. Right dorsalis pedis pulse is palpable. Wife shares that he has been in significant pain all day (despite receiving pain medication), but that the color changes to his right ankle are new within the last 30 minutes.   16:21--I paged Family Medicine TS. 16: 24--Received a call back from a resident. Informed her patient has an acute change to his RLE and is in significant pain. Requested someone at bedside as soon as possible to assess.   A few minutes later, Dr. Pilar Plate is at bedside. He discussed concern for new lower extremity thrombus causing critical limb ischemia.   He also discussed conversation earlier today with patient's oncologist Dr. Hinton Rao. Discussed that there continue to be treatment options of immunotherapy (just received approval for a new agent) and radiation. Emphasized that treatment is palliative, not curative. Wife verbalizes understanding of treatment goals. Per Dr. Hinton Rao, in light of recent complications of RLE thrombus and now acute CVA, it would also be very reasonable to transition to comfort/hospice.   I also discussed with patient and wife the issue of continued high risk for thrombotic events due to he is in a  hypercoagulable state secondary to cancer. Discussed this risk can't be eliminated, even with anticoagulation.   Length of Stay: 1  Current Medications: Scheduled Meds:  . amiodarone  200 mg Oral Daily  . aspirin  81 mg Oral Daily  . dexamethasone  2 mg Oral Daily   Followed by  . [START ON 07/31/2020] dexamethasone  1 mg Oral Daily  . digoxin  0.0625 mg Oral Daily  . diltiazem  180 mg Oral Daily  . enoxaparin (LOVENOX) injection  80 mg Subcutaneous Q12H  . gabapentin  100 mg Oral TID  . insulin aspart  0-9 Units Subcutaneous  TID WC  . lidocaine  1 patch Transdermal Daily  . melatonin  3 mg Oral QHS  . metoprolol tartrate  50 mg Oral BID  . sodium chloride flush  3 mL Intravenous Once    Continuous Infusions:   PRN Meds: acetaminophen, cyclobenzaprine, HYDROmorphone (DILAUDID) injection, ondansetron, oxyCODONE, senna-docusate  Physical Exam Constitutional:      General: He is in acute distress.     Appearance: He is ill-appearing.  Cardiovascular:     Rate and Rhythm: Normal rate. Rhythm irregularly irregular.     Comments: A-fib Pulmonary:     Effort: Pulmonary effort is normal.  Neurological:     Mental Status: He is alert.             Vital Signs: BP 110/63   Pulse 88   Temp 98.4 F (36.9 C) (Oral)   Resp 18   Ht 5\' 3"  (1.6 m)   Wt 73.7 kg   SpO2 100%   BMI 28.79 kg/m  SpO2: SpO2: 100 % O2 Device: O2 Device: Room Air O2 Flow Rate:    Intake/output summary: No intake or output data in the 24 hours ending 07/24/20 1224 LBM:   Baseline Weight: Weight: 73.7 kg Most recent weight: Weight: 73.7 kg       Palliative Assessment/Data: PPS 40-50%      Palliative Care Assessment & Plan   HPI/Patient Profile: 60 y.o. male  with past medical history of atrial fibrillation, pulmonary embolism, SVC thrombus, right iliac artery embolism status post thrombectomy and stent placement, CHF, recent diagnosis of malignant melanoma with brain metastasis presented to  the ED on 07/22/20 from home with wife's concerns of patient's facial droop, slurred speech, and no left arm movement or sensation. Code Stroke was initiated on arrival to ED and CTA/CTP showed right M3 branch occlusion and corresponding ischemia.Patient was admitted to [facility] with CVA with left sided weakness and metastatic melanoma.   Patient was hospitalized at [facility] 2/1--2/17 and underwent right iliac thrombectomy with fasciotomy on 2/1. Discharged on lovenox and with a wound vac.  Assessment: - acute CVA - metastatic melanoma - Atrial fibrillation with RVR - acute on chronic anemia - right lower extremity wound, s/p fasciotomy - superior vena cava thrombus - hyponatremia - hypoalbuminemia  Recommendations/Plan: Continue full scope treatment Continue full code status At this time, patient and wife are open to continuing cancer treatment Patient needs outpatient palliative referral (TOC placed 2/20) PMT will continue to follow  Goals of Care and Additional Recommendations: Limitations on Scope of Treatment: Full Scope Treatment  Code Status: Full  Prognosis:  poor in the setting of metastatic melanoma  Discharge Planning: To Be Determined, likely CIR   Care plan was discussed with Dr. Pilar Plate and nursing  Thank you for allowing the Palliative Medicine Team to assist in the care of this patient.   Total Time 35 minutes Prolonged Time Billed  no       Greater than 50%  of this time was spent counseling and coordinating care related to the above assessment and plan.  Lavena Bullion, NP  Please contact Palliative Medicine Team phone at [phone] for questions and concerns.

## 2020-07-24 NOTE — Progress Notes (Signed)
PT eval complete, formal note pending currently recommending CIR for ongoing rehab. No equipment needs.   Windell Norfolk, DPT, PN1   Supplemental Physical Therapist [facility]    Pager [phone] Acute Rehab Office [phone]

## 2020-07-24 NOTE — Progress Notes (Signed)
Dr. Oneida Alar, with vascular surgery, called to update the team on his assessment of Adam Bender.  He notes that he reviewed the images and noted that there is an impressive blood clot in his left leg (where he does not have significant symptoms) but no significant clot in the right leg (where he is experiencing symptoms).  As a result, there is no intervention necessary at this time from a vascular surgery perspective Dr. Oneida Alar noted that he does have significant clot burden in his IVC and other vasculature which makes him particularly challenging as he is already on anticoagulation.  He notes that a goals of care discussion will be important for this patient moving forward.  He noted that he will remain available if the symptoms of Adam Bender change and intervention becomes necessary in the left leg.  Matilde Haymaker, MD

## 2020-07-24 NOTE — Consult Note (Signed)
Referring Physician: Dr. Matilde Haymaker family practice service  Patient name: Adam Bender. MRN: [id number] DOB: Mar 16, 1961 Sex: male  REASON FOR CONSULT: Possible right foot ischemia  HPI: Adam Bender. is a 61 y.o. male, who complained in pain of his right foot earlier today.  The patient underwent thrombectomy of his right iliac artery on February 1.  He underwent redo thrombectomy of his right femoral and iliac system on February 4.  This was all done at [facility].  The patient thrombosed his iliac and femoral system despite being on anticoagulation with Eliquis.  However, there was some concern that he may have been off this for a few days for procedure.  Of note the patient also has a history of atrial fibrillation.  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  He also has a history of a PFO.  He was admitted to the hospital yesterday with an acute embolic stroke.  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  He has been on chronic anticoagulation since discharge from [facility] and continued to have an embolic event despite anticoagulation.  The patient complained of pain in his right foot earlier today.  He states it has improved.  He currently is treating this with hot packs.  He also had fasciotomies in the right leg at [facility].  He does not complain of numbness and tingling in either foot.  The patient has mainly clumsiness weakness and discoordination of the left hand from his stroke.  He states he does have some problems with short-term recall memory but he completely understands his current situation.  He is able to move both feet.  He is currently on therapeutic Lovenox at 60 mg twice daily.  This was increased to 80 mg twice daily in light of his recent embolic stroke.  Apparently hematology was consulted regarding anticoagulation and really had no further recommendations.  The  patient and his wife are also currently having ongoing discussions with palliative care to figure out goals of care.  Past Medical History:  Diagnosis Date  . Atrial fibrillation ([place]) 02/29/2020  . Depressed left ventricular ejection fraction 02/29/2020  . Malignant melanoma of other part of trunk ([place]) 02/20/2020  . Malignant melanoma of skin of trunk, except scrotum [facility])    Past Surgical History:  Procedure Laterality Date  . MOLE REMOVAL    . SALIVARY GLAND SURGERY      Family History  Problem Relation Age of Onset  . Hypertension Mother   . Hypertension Father     SOCIAL HISTORY: Social History   Socioeconomic History  . Marital status: Married    Spouse name: Not on file  . Number of children: Not on file  . Years of education: Not on file  . Highest education level: Not on file  Occupational History  . Not on file  Tobacco Use  . Smoking status: Former Research scientist (life sciences)  . Smokeless tobacco: Never Used  Substance and Sexual Activity  . Alcohol use: Not on file  . Drug use: Not on file  . Sexual activity: Not on file  Other Topics Concern  . Not on file  Social History Narrative  . Not on file   Social Determinants of Health   Financial Resource Strain: Not on file  Food Insecurity: Not on file  Transportation Needs: Not on file  Physical Activity: Not on file  Stress: Not  on file  Social Connections: Not on file  Intimate Partner Violence: Not on file    No Known Allergies  Current Facility-Administered Medications  Medication Dose Route Frequency Provider Last Rate Last Admin  . acetaminophen (TYLENOL) tablet 1,000 mg  1,000 mg Oral Q6H PRN Gladys Damme, MD      . amiodarone (PACERONE) tablet 200 mg  200 mg Oral Daily Meccariello, Bernita Raisin, DO   200 mg at 07/24/20 0954  . aspirin chewable tablet 81 mg  81 mg Oral Daily Gladys Damme, MD   81 mg at 07/24/20 9485  . cyclobenzaprine (FLEXERIL) tablet 10 mg  10 mg Oral TID PRN Gladys Damme, MD   10 mg  at 07/23/20 0225  . dexamethasone (DECADRON) tablet 2 mg  2 mg Oral Daily Donnamae Jude, RPH   2 mg at 07/24/20 0957   Followed by  . [START ON 07/31/2020] dexamethasone (DECADRON) tablet 1 mg  1 mg Oral Daily Donnamae Jude, RPH      . digoxin (LANOXIN) tablet 0.0625 mg  0.0625 mg Oral Daily Gladys Damme, MD   0.0625 mg at 07/24/20 0956  . diltiazem (CARDIZEM CD) 24 hr capsule 180 mg  180 mg Oral Daily Gladys Damme, MD   180 mg at 07/24/20 1011  . gabapentin (NEURONTIN) capsule 100 mg  100 mg Oral TID Gladys Damme, MD   100 mg at 07/24/20 1645  . HYDROmorphone (DILAUDID) 1 MG/ML injection           . HYDROmorphone (DILAUDID) injection 0.5 mg  0.5 mg Intravenous Q4H PRN Martyn Malay, MD   0.5 mg at 07/24/20 1536  . insulin aspart (novoLOG) injection 0-9 Units  0-9 Units Subcutaneous TID WC Carollee Leitz, MD   1 Units at 07/24/20 1645  . lidocaine (LIDODERM) 5 % 1 patch  1 patch Transdermal Daily Gladys Damme, MD   1 patch at 07/24/20 [phone]  . melatonin tablet 3 mg  3 mg Oral QHS Gladys Damme, MD   3 mg at 07/23/20 2212  . metoprolol tartrate (LOPRESSOR) tablet 50 mg  50 mg Oral BID Gladys Damme, MD   50 mg at 07/24/20 1042  . ondansetron (ZOFRAN-ODT) disintegrating tablet 4 mg  4 mg Oral Q4H PRN Gladys Damme, MD   4 mg at 07/23/20 1954  . oxyCODONE (Oxy IR/ROXICODONE) immediate release tablet 10 mg  10 mg Oral Q6H PRN Gladys Damme, MD   10 mg at 07/24/20 1457  . senna-docusate (Senokot-S) tablet 2 tablet  2 tablet Oral QHS PRN Gladys Damme, MD      . sodium chloride flush (NS) 0.9 % injection 3 mL  3 mL Intravenous Once Plunkett, Whitney, MD        ROS:   General:  No weight loss, Fever, chills  HEENT: No recent headaches, no nasal bleeding, no visual changes, no sore throat  Neurologic: No dizziness, blackouts, seizures. +recent symptoms of stroke or mini- stroke. No recent episodes of slurred speech, or temporary blindness.  Cardiac: No recent episodes  of chest pain/pressure, no shortness of breath at rest.  No shortness of breath with exertion.  Denies history of atrial fibrillation or irregular heartbeat  Vascular: No history of rest pain in feet.  No history of claudication.  No history of non-healing ulcer, No history of DVT   Pulmonary: No home oxygen, no productive cough, no hemoptysis,  No asthma or wheezing  Musculoskeletal:  [ ]  Arthritis, [ ]  Low back pain,  [ ]   Joint pain  Hematologic:+ history of hypercoagulable state.  No history of easy bleeding.  No history of anemia  Gastrointestinal: No hematochezia or melena,  No gastroesophageal reflux, no trouble swallowing  Urinary: [ ]  chronic Kidney disease, [ ]  on HD - [ ]  MWF or [ ]  TTHS, [ ]  Burning with urination, [ ]  Frequent urination, [ ]  Difficulty urinating;   Skin: No rashes  Psychological: No history of anxiety,  No history of depression   Physical Examination  Vitals:   07/24/20 1230 07/24/20 1245 07/24/20 1606 07/24/20 2012  BP: 110/67  (!) 147/77 131/75  Pulse: 74  75 95  Resp: 18  16   Temp:  99.7 F (37.6 C) 98.7 F (37.1 C) 98.4 F (36.9 C)  TempSrc:  Oral Oral Oral  SpO2: 96%  98% 99%  Weight:      Height:        Body mass index is 28.79 kg/m.  General:  Alert and oriented, no acute distress HEENT: Normal Neck: No JVD Pulmonary: Clear to auscultation bilaterally Cardiac: Regular Rate and Rhythm Abdomen: Soft, non-tender, non-distended Skin: No rash, right foot pink compared to the left foot which is slightly dusky and pale Extremity Pulses:  2+ radial, brachial bilaterally, 2+ right absent left femoral, 2+ right popliteal absent left popliteal absent dorsalis pedis, posterior tibial pulses bilaterally Musculoskeletal: No deformity or edema, VAC on fasciotomies right leg  Neurologic: Upper and lower extremity motor 5/5 and symmetric, no obvious sensory deficit  DATA:  I reviewed the images of the patient's CT angiogram from this evening.   The celiac superior mesenteric and bilateral renal arteries are all patent.  This shows occlusion of his left common iliac artery and what appears to be a left common iliac stent although this could be circumferential calcification.  There is a tail of thrombus extending up into the abdominal aorta.  The right common iliac artery is patent.  The left external iliac artery is patent.  The left internal iliac artery is small but patent.  The proximal common femoral artery is patent but this occludes at the femoral bifurcation.  There is thrombus extending into the origin of the profunda and superficial femoral arteries.  The right external/internal iliac artery is patent.  The right common femoral profunda femoris and superficial femoral arteries are patent.  The right popliteal artery is patent and there is two-vessel runoff with a posterior tibial artery dominant to the right foot.  In the left lower extremity the left superficial femoral artery and distal profunda do reconstitute.  The left superficial femoral artery and popliteal artery is patent.  There is two-vessel runoff to the left foot with a dominant posterior tibial artery.  ASSESSMENT: Patient with recent acute stroke secondary to embolic event with known metastatic malignant melanoma with brain metastases.  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  He has recently undergone thrombectomy of the right leg on 2 occasions 2 weeks ago.  He has now formed thrombus in his left iliac and femoral system despite being on full anticoagulation.  Currently he is asymptomatic in the left leg but I suspect this may get worse over the next day or so if untreated.  I discussed this situation with the wife and the patient today.  I spoke with the wife by phone.  She is going to talk with her husband further this evening and tomorrow morning  to again determine goals of care.  I discussed  with her that most likely he would need an operation on his left leg tomorrow if they decide to continue with a aggressive management.  However I also did counsel her that since he has had an acute stroke and 2 episodes of thrombosis in his right leg within the last 14 days that he is very high risk to have further continuing embolic events or rethrombosis in the left leg and that we may be reaching the end of beneficial interventions.  I will reevaluate the patient again in the morning and speak with his wife.  I also discussed with her that the operation would carry some risk in light of the fact that he has had an acute stroke within the last 24 hours.   PLAN: 1.  Switch patient from Lovenox to full dose IV heparin since he has now thrombosed despite being on Lovenox.  2.  Keep patient n.p.o. after midnight in the event that he has to go to the operating room tomorrow.  3.  Continuing ongoing discussions need to further clarify goals of care.   Ruta Hinds, MD [facility] Office: [phone]

## 2020-07-24 NOTE — Progress Notes (Signed)
Critical lab value, lactic acid 2.8 MD Paged

## 2020-07-24 NOTE — Evaluation (Signed)
Physical Therapy Evaluation Patient Details Name: Adam Bender. MRN: [id number] DOB: 28-Nov-1960 Today's Date: 07/24/2020   History of Present Illness  60yo male presenting with acute CVA symptoms likely due to embolic event and related ischemia. Found to have multiple R MCA CVA acute infarcts. PMH metastatic malignant melanoma with brain mets, A-fib with recent venous and arterial clots s/p thrombectomy, hypercoagulable state due to high level cancer  Clinical Impression   Discussed case with attending MD Dr. Dorris Singh, who cleared patient for participation in PT. Patient received on ED stretcher, pleasant and cooperative with PT; patient very accepting of but spouse very emotional about his complex medical status at this time. They report he had been on the way to being fully independent at home after his vascular surgeries and are frustrated at this set back, they really value him being able to be independent at home as much as possible. Able to edge of ED stretcher well with MinAx2, however deferred OOB activities  Due to safety concerns given very high ED stretcher/very short patient and being able to safely get back onto a tall surface. Does demonstrate impaired LTT, as well as impaired RAM testing and significant L sided strength deficits. Left on stretcher with all needs met, spouse present. May benefit from intensive therapies in the CIR setting moving forward to meet goals.     Follow Up Recommendations CIR;Supervision for mobility/OOB    Equipment Recommendations  Rolling walker with 5" wheels;3in1 (PT);Wheelchair (measurements PT);Wheelchair cushion (measurements PT)    Recommendations for Other Services       Precautions / Restrictions Precautions Precautions: Fall;Other (comment) Precaution Comments: L weakness UE>LE, hypercoagulable from CA Restrictions Weight Bearing Restrictions: No      Mobility  Bed Mobility Overal bed mobility: Needs Assistance Bed  Mobility: Supine to Sit;Sit to Supine     Supine to sit: Min assist;+2 for physical assistance Sit to supine: Min assist;+2 for physical assistance   General bed mobility comments: increased time and effort to get to EOB, did need a bit of extra help getting up from ED stretcher but able to follow cues well with good awareness of L side/weakness    Transfers                 General transfer comment: deferred- safety, very high ED stretcher  Ambulation/Gait             General Gait Details: deferred- safety, very high ED stretcher  Stairs            Wheelchair Mobility    Modified [name] (Stroke Patients Only)       Balance Overall balance assessment: Needs assistance Sitting-balance support: Feet unsupported;No upper extremity supported Sitting balance-Leahy Scale: Fair Sitting balance - Comments: mild L lean but aware of and able to somewhat correct                                     Pertinent Vitals/Pain Pain Assessment: Faces Faces Pain Scale: Hurts whole lot Pain Location: B feet Pain Descriptors / Indicators: Discomfort Pain Intervention(s): Limited activity within patient's tolerance;Monitored during session    Home Living Family/patient expects to be discharged to:: Private residence Living Arrangements: Spouse/significant other Available Help at Discharge: Family;Available 24 hours/day Type of Home: House Home Access: Stairs to enter   [employer] of Steps: 2 STE cannot reach both at the same time Home Layout:  One level Home Equipment: Hospital bed;Bedside commode;Shower seat;Walker - 4 wheels;Walker - 2 wheels      Prior Function Level of Independence: Needs assistance         Comments: still needed physical assistance after surgeries at [place] 2 weeks ago; wants to maintain independence as long as possible     Hand Dominance        Extremity/Trunk Assessment   Upper Extremity Assessment Upper  Extremity Assessment: LUE deficits/detail LUE Deficits / Details: L UE grossly 2-/5 at best, all muscle groups flaccid and unable to raise arm without compensations LUE Sensation: decreased proprioception;decreased light touch LUE Coordination: decreased gross motor;decreased fine motor (weakness)    Lower Extremity Assessment Lower Extremity Assessment: LLE deficits/detail;RLE deficits/detail RLE Deficits / Details: WNL RLE Sensation: history of peripheral neuropathy LLE Deficits / Details: grossly 3 to 4-/5 LLE Sensation: decreased light touch;decreased proprioception;history of peripheral neuropathy LLE Coordination: decreased gross motor;decreased fine motor    Cervical / Trunk Assessment Cervical / Trunk Assessment: Kyphotic  Communication   Communication: No difficulties  Cognition Arousal/Alertness: Awake/alert Behavior During Therapy: Flat affect Overall Cognitive Status: Within Functional Limits for tasks assessed                                 General Comments: answered questions and followed cues appropriately, A&Ox4      General Comments      Exercises     Assessment/Plan    PT Assessment Patient needs continued PT services  PT Problem List Decreased strength;Decreased knowledge of use of DME;Decreased activity tolerance;Decreased safety awareness;Decreased balance;Decreased mobility;Decreased coordination;Impaired sensation       PT Treatment Interventions DME instruction;Balance training;Gait training;Neuromuscular re-education;Stair training;Functional mobility training;Patient/family education;Therapeutic activities;Therapeutic exercise;Wheelchair mobility training    PT Goals (Current goals can be found in the Care Plan section)  Acute Rehab PT Goals Patient Stated Goal: be as independent as possible at home PT Goal Formulation: With patient/family Time For Goal Achievement: 08/07/20 Potential to Achieve Goals: Fair    Frequency Min  3X/week   Barriers to discharge        Co-evaluation               AM-PAC PT "6 Clicks" Mobility  Outcome Measure Help needed turning from your back to your side while in a flat bed without using bedrails?: A Little Help needed moving from lying on your back to sitting on the side of a flat bed without using bedrails?: A Little Help needed moving to and from a bed to a chair (including a wheelchair)?: A Lot Help needed standing up from a chair using your arms (e.g., wheelchair or bedside chair)?: A Lot Help needed to walk in hospital room?: A Lot Help needed climbing 3-5 steps with a railing? : Total 6 Click Score: 13    End of Session     Patient left: in bed;with call bell/phone within reach;Other (comment) (on ED stretcher) Nurse Communication: Mobility status PT Visit Diagnosis: Unsteadiness on feet (R26.81);Muscle weakness (generalized) (M62.81);Other symptoms and signs involving the nervous system (R29.898);Difficulty in walking, not elsewhere classified (R26.2)    Time: 1610-9604 PT Time Calculation (min) (ACUTE ONLY): 20 min   Charges:   PT Evaluation $PT Eval High Complexity: 1 High         Windell Norfolk, DPT, PN1   Supplemental Physical Therapist [place]    Pager [phone] Acute Rehab Office [phone]

## 2020-07-24 NOTE — Progress Notes (Signed)
S) Mr. Adam Bender was assessed at bedside due to acute, new onset right lower extremity pain.  Per the nurses report, he was recently received 0.5 mg Dilaudid in addition to 10 mg oxycodone which has not helped in the reduction of this acute onset pain.  Mr. Adam Bender reports extreme right foot pain that is started just in the past half hour.  His wife notes that the outside of his right ankle seems to have turned red and then blue just in the past half hour or so.  O) BP (!) 147/77 (BP Location: Right Arm)   Pulse 75   Temp 98.7 F (37.1 C) (Oral)   Resp 16   Ht 5\' 3"  (1.6 m)   Wt 73.7 kg   SpO2 98%   BMI 28.79 kg/m   General: Moderate distress related to his right foot pain.  Tolerated a physical exam without issue. Respiratory: Breathing comfortably on room air. Right lower extremity: Cool to the touch, dusky, bluish appearance of the lateral malleolus.  Some difficulty flexing and extending digits 2-5.  Unable to palpate DP and TP pulses.  A/P) Mr. Adam Bender is a 60 year old gentleman presenting with complications related to his known metastatic melanoma.  He was recently hospitalized for multiple thrombi that required thrombectomy procedures in the lower extremities bilaterally.  I am concerned that he may be experiencing a new lower extremity thrombus causing critical limb ischemia. -CTA lower extremities ordered with imaging bilaterally -Repeat labs ordered including lactic acid -Vascular surgery paged to be made aware, I have not yet received a call back -dilaudid 1 mg ordered

## 2020-07-24 NOTE — Consult Note (Addendum)
[facility] Nurse Consult Note: Patient receiving care in [facility] ED09 NPWT change from home vac to hospital vac Fasciotomies created at [facility] Reason for Consult: Wound Vac Change Wound type: Medial and Lateral fasciotomies of the RLE Pressure Injury POA: NA Measurement: Medial 10 cm x 1.5 x 0.1 cm 25% yellow tissue with 75% granulation tissue Lateral 10 cm x 3 cm x 0.1 cm with undermining of 0.5 cm on the posterior edge of the wound. 50% yellow tissue with 50% red granulation tissue and visible yellow tendons. Wound bed: Drainage (amount, consistency, odor) Serosanguinous fluid in canister of home vac. Periwound: medial side in intact, lateral side is red and erythmatous Dressing procedure/placement/frequency: Home vac removed, one piece of black foam removed from each wound and one piece of black foam from bridging site. Spouse present for vac change and states that the lateral wound has had bleeders that literally spirted out that last two vac changes, therefore this dressing was removed very slow and cautiously with NS. The lateral wound has some minor bleeding but no excessive bleeders. Medpitel was used over both wounds and then 1 piece black foam placed over each wound with one piece of black foam used for bridging after drape placed over the skin under the bridge. All dressings covered with clear drape and suction to 125 mmHg with no leaking noted. Patient tolerated the procedure well.  Supplies have been ordered to place at bedside. Mepitel used from [place] and should arrive in materials by this afternoon.  [place] nurse to change NPWT dressings M W F  Monitor the wound area(s) for worsening of condition such as: Signs/symptoms of infection, increase in size, development of or worsening of odor, development of pain, or increased pain at the affected locations.   Notify the medical team if any of these develop.  Thank you for the consult. Please re-consult the [place] team if needed.  Cathlean Marseilles Tamala Julian, MSN, RN, [place], [place]le [place], [facility] Wound Treatment Associate Pager [phone]

## 2020-07-24 NOTE — Progress Notes (Signed)
FMTS Attending Daily Note: Adam Singh, MD  Team Pager [phone] Pager [phone]  I have seen and examined this patient, reviewed their chart. I have discussed this patient with the resident. I agree with the resident's findings, assessment and care plan.  In addition to below patient had acute worsening of right leg pain today despite his usual therapies.  Will obtain CTA and vascular surgery has been notified.  This was after transition off of heparin onto Lovenox.  Patient and wife at bedside on evaluation today.  Exam relatively unchanged.  Patient quite upset about his left arm dysfunction.  They would like hematology oncology to weigh in about prognosis and ongoing therapies before considering going home versus going to inpatient rehab.  They appreciated discussion with palliative care team and they are discussing various options.  They value their time together as they have been married 39 years and no not sure spending time in rehab would necessarily be worth it.  Disposition: Ongoing medical evaluation for right lower extremity. Pending evaluation,  CIR vs.home with therapies.    Family Medicine Teaching Service Daily Progress Note Intern Pager: [phone]  Patient name: Adam Bender. Medical record number: [id number] Date of birth: Jan 25, 1961 Age: 60 y.o. Gender: male  Primary Care Provider: Patient, No Pcp Per Consultants: Neurology, hematology-oncology  Code Status: FULL  Pt Overview and Major Events to Date:  Admitted 2/19: CVA work-up 2/21: Acute worsening of right foot pain, CTA pending   Assessment and Plan: Adam Bender. is a 60 y.o. male presenting with slurred speech, left-sided facial droop, left-sided weakness found to have CVA and progression of metastatic disease  PMH is significant for metastatic malignant melanoma, brain metastases, atrial fibrillation with recent venous and arterial clot s/p vascular surgery 2 weeks ago.  Left-Sided Weakness, 2/2  Acute CVA States his weakness on the left side is still present, but improved since arrival. Speech has improved, as well as sensation. MRI showed multiple acute right MCA territory infarcts, largest in tempoparietal region with minimally increased frontal operculum metastasis and unchanged size of other lesions. -Frequent neuro checks -Heparin gtt, rate per pharmacy -Neurology recs: lipid panel normal, A1c 6.4%, will sign off  -PT recs CIR -Hematology consult for anticoagulation: Lovenox 80mg  BID and ASA 81   Metastatic Melanoma Follows with Dr. Hinton Rao. Metastases to brain, pulmonary nodules, left axillary nodules, on steroid taper, see below. Radiation to left axilla started 2/1, next date of radiation is 07/24/20. Pain management as below. -Decadron taper: 2 mg x7 days (2/21-2/27), 1 mg x7days (2/28-3/6) -Pain control: oxycodone 10mg  q6h prn, tylenol 1000mg  q6 PRN, gabapentin 100mg  TID, flexeril 10mg  q8 PRN, lidoderm patch, dilaudid 0.5 IV q4 PRN   History of Recurrent Right Iliac Artery Thrombus  S/p thrombectomy in fasciotomy on 07/04/2020 and repeat thrombectomy on 07/07/2020. -Heparin drip, rate per pharmacy -Start lovenox 80mg  BID today, stop heparin drip   Atrial Fibrillation Follows with Dr. Roetta Sessions. Admission ECG showed atrial fibrillation with RVR, rate 115-130. Wife reports this is his baseline. In normal rhythm on exam. Previously on Eliquis, but following admission at [facility], was discharged on ASA 81mg  and Lovenox 60mg  BID. -Continue home rate and rhythm control: digoxin 0.0625mg  qd, diltiazem 180mg  qd, amiodarone 200mg  qd, metoprolol 50mg  BID -Anticoagulation: continue ASA, lovenox  Leukocytosis, improving Admission WBC 24.6>22.4. Likely in the setting of known metastatic melanoma and current steroid use. Could also consider infectious etiology, but unlikely as blood cultures no growth at 1 day. -Daily CBC  Normocytic Anemia, stable Admission Hgb 8.7>8.6. Likely in the  setting of recent vascular procedure and known metastatic cancer. Transfusion threshold 8. -Daily CBC  Hypokalemia, resolved Admission K 3.2>4.2. Mg 2.2. -Daily BMP  HFrEF, Previous EF 50% in 2021 Echo 06/17/20, EF 71-69%, diastolic parameters indeterminate. Home meds: metoprolol as above, no longer on jardiance 10mg , Lasix 20mg  QD, entresto 49/51 BID since [facility] admission. Unclear why these medications were discontinued, but will refer to cardiology for further med reconciliation.   Elevated A1c at 6.4% -Sensitive SSI while admitted, no outpatient medication  FEN/GI: Heart healthy PPx: Heparin drip  Disposition: Admit to inpatient, FPTS, attending Dr. Owens Shark  Subjective:  Patient seen in ED today. No acute complaints. State left UE still weak and unable to move without right-sided involvement.   Objective: Temp:  [98.4 F (36.9 C)] 98.4 F (36.9 C) (02/20 1936) Pulse Rate:  [25-140] 72 (02/21 0630) Resp:  [13-22] 18 (02/21 0630) BP: (96-139)/(60-91) 107/71 (02/21 0630) SpO2:  [77 %-100 %] 97 % (02/21 0630) Physical Exam: General: Overall well appearing, resting in bed comfortably. NAD. Alert and oriented, pleasant. Cardiovascular: RRR, no murmurs, rubs, gallops. Respiratory: CTAB, no increased work of breathing on room air Abdomen: soft, nontender, nondistended Extremities: wound vac placed on right shin with wound dark with surrounding erythema, nontender to palpation to surrounding area. Surgical site in right groin clean and dry, staples in place.  Cranial nerves: CN III, IV, VI: EOM in tact; V: equal sensation; VII: facial expressions symmetrical bilaterally; IX: tongue midline Motor: lower extremities strength 5/5 bilaterally; RUE 5/5, LUE 2/5 with little effort on hand grip  Laboratory: Recent Labs  Lab 07/22/20 2200 07/22/20 2212 07/23/20 0310 07/23/20 1711 07/24/20 0241  WBC 24.6*  --  23.3*  --  22.4*  HGB 8.7*   < > 7.9* 8.9* 8.6*  HCT 29.0*   < > 25.0* 28.9*  27.1*  PLT 245  --  201  --  254   < > = values in this interval not displayed.   Recent Labs  Lab 07/22/20 2200 07/22/20 2212 07/23/20 0310 07/23/20 1711 07/24/20 0241  NA 134*   < > 131* 131* 129*  K 3.2*   < > 3.2* 4.7 4.2  CL 100   < > 100 101 98  CO2 22  --  20* 21* 21*  BUN 13   < > 11 12 15   CREATININE 0.72   < > 0.68 0.74 0.76  CALCIUM 8.0*  --  7.7* 8.2* 8.2*  PROT 5.5*  --   --   --   --   BILITOT 0.4  --   --   --   --   ALKPHOS 294*  --   --   --   --   ALT 28  --   --   --   --   AST 27  --   --   --   --   GLUCOSE 213*   < > 169* 130* 131*   < > = values in this interval not displayed.    Imaging/Diagnostic Tests: CT Code Stroke CTA Head/Neck W/WO contrast 07/22/2020 IMPRESSION:  1. Occlusion of a distal right M3 branch in the inferior right parietal region. 9 mL core infarction with slight loss of gray-white differentiation at the inferior right parietal region. 19 CC region of at risk brain with a mismatch volume of 19 mL.  2. No carotid bifurcation disease. No visible aortic atherosclerosis.  3. Hilar  and mediastinal lymphadenopathy consistent with the diagnosis of metastatic disease.  MR BRAIN W WO CONTRAST 07/23/2020 IMPRESSION:  1. Multiple acute right MCA territory infarcts, largest in the temporoparietal region.  2. Minimally increased size of 4 mm right frontal operculum metastasis.  3. Essentially unchanged size of other lesions with decreased vasogenic edema in the temporal lobes.  4. Unchanged left cerebral leptomeningeal enhancement.   DG Chest Port 1 View 07/22/2020 IMPRESSION: No definite acute cardiopulmonary process.   CT HEAD CODE STROKE WO CONTRAST 07/22/2020 IMPRESSION:  1. Known bilateral temporal lobe metastatic disease. Subtle architectural distortion of the inferior temporal lobe on the right. Some volume loss and distortion of the inferior and anterior left temporal lobe. Other tiny metastases previously seen by MRI are not specifically  appreciable. No acute finding by CT. 2. ASPECTS is 10.    Karle Plumber, Medical Student 07/24/2020, 7:05 AM Acting Intern, [place] [facility] Intern pager: [phone], text pages welcome

## 2020-07-24 NOTE — Progress Notes (Signed)
ANTICOAGULATION CONSULT NOTE - Follow Up Consult  Pharmacy Consult for Heparin to enoxaparin Indication: atrial fibrillation, recent venous and arterial clot, acute CVA   No Known Allergies  Patient Measurements: Height: 5\' 3"  (160 cm) Weight: 73.7 kg (162 lb 8 oz) IBW/kg (Calculated) : 56.9 Heparin Dosing Weight: 72kg  Vital Signs: Temp: 98.4 F (36.9 C) (02/21 2012) Temp Source: Oral (02/21 2012) BP: 131/75 (02/21 2012) Pulse Rate: 95 (02/21 2012)  Labs: Recent Labs    07/22/20 2200 07/22/20 2212 07/23/20 0310 07/23/20 0807 07/23/20 1711 07/24/20 0100 07/24/20 0241 07/24/20 1554 07/24/20 1810  HGB 8.7*   < > 7.9*  --  8.9*  --  8.6*  --  8.4*  HCT 29.0*   < > 25.0*  --  28.9*  --  27.1*  --  26.4*  PLT 245  --  201  --   --   --  254  --  256  APTT 28  --   --   --   --   --   --   --   --   LABPROT 13.0  --   --   --   --   --   --   --   --   INR 1.0  --   --   --   --   --   --   --   --   HEPARINUNFRC  --   --   --  0.21* 0.13* 0.20*  --   --   --   CREATININE 0.72   < > 0.68  --  0.74  --  0.76 0.80 0.82   < > = values in this interval not displayed.    Estimated Creatinine Clearance: 87.3 mL/min (by C-G formula based on SCr of 0.82 mg/dL).   Medical History: Past Medical History:  Diagnosis Date  . Atrial fibrillation ([place]) 02/29/2020  . Depressed left ventricular ejection fraction 02/29/2020  . Malignant melanoma of other part of trunk ([place]) 02/20/2020  . Malignant melanoma of skin of trunk, except scrotum [facility])     Assessment: 60 y.o. M with acute CVA. Pt on Lovenox 60mg  SQ q12h PTA for atrial fibrillation with recent venous and arterial clot status post R iliac thrombectomy with fasciotomy at [facility] 07/04/20. Patient had bleeding at site requiring RBCs. Last dose of Lovenox given 2/19 0930. Pharmacy consulted for heparin with lower goal rate of 0.3-0.5 units/ml (neurology approved).  Patient transitioned from heparin to enoxaparin earlier today. Pharmacy  consulted to transition back to heparin for aortic iliac femoral thrombus despite DOAC and enoxaparin use per vascular. Last dose of enoxaparin 80mg  at ~1100 on 07/24/2020.   Goal of Therapy:  Heparin level 0.3-0.5 units/ml Monitor platelets by anticoagulation protocol: Yes   Plan:  Start heparin 1400 units/hr Check 6 hr heparin level Monitor heparin level, CBC and S/S of bleeding daily  Follow up plans for anticoagulation   Cristela Felt, PharmD Clinical Pharmacist

## 2020-07-24 NOTE — ED Notes (Signed)
Tele Breakfast Orders Placed

## 2020-07-24 NOTE — Progress Notes (Addendum)
STROKE TEAM PROGRESS NOTE   INTERVAL HISTORY No acute events since arrival.   Patient reports he was able to rest some overnight. His LUE discomfort remains his focus of concern. He denies headache, new numbness, new vision changes or new weakness. He is hungry for breakfast.  Wound care RN arrived to change VAC dressing.    Vitals:   07/24/20 0630 07/24/20 0700 07/24/20 0730 07/24/20 0800  BP: 107/71 98/65 100/68 100/66  Pulse: 72 87 74 71  Resp: 18 (!) 21 16 15   Temp:      TempSrc:      SpO2: 97% 98% 97% 97%  Weight:      Height:       CBC:  Recent Labs  Lab 07/22/20 2200 07/22/20 2212 07/23/20 0310 07/23/20 1711 07/24/20 0241  WBC 24.6*  --  23.3*  --  22.4*  NEUTROABS 21.8*  --   --   --   --   HGB 8.7*   < > 7.9* 8.9* 8.6*  HCT 29.0*   < > 25.0* 28.9* 27.1*  MCV 94.2  --  91.6  --  91.9  PLT 245  --  201  --  254   < > = values in this interval not displayed.   Basic Metabolic Panel:  Recent Labs  Lab 07/23/20 0310 07/23/20 1711 07/24/20 0241  NA 131* 131* 129*  K 3.2* 4.7 4.2  CL 100 101 98  CO2 20* 21* 21*  GLUCOSE 169* 130* 131*  BUN 11 12 15   CREATININE 0.68 0.74 0.76  CALCIUM 7.7* 8.2* 8.2*  MG 1.6* 2.2  --    Lipid Panel:  Recent Labs  Lab 07/24/20 0241  CHOL 127  TRIG 145  HDL 33*  CHOLHDL 3.8  VLDL 29  LDLCALC 65   HgbA1c:  Recent Labs  Lab 07/24/20 0241  HGBA1C 6.4*   Urine Drug Screen: No results for input(s): LABOPIA, COCAINSCRNUR, LABBENZ, AMPHETMU, THCU, LABBARB in the last 168 hours.  Alcohol Level No results for input(s): ETH in the last 168 hours.  IMAGING past 24 hours MR BRAIN W WO CONTRAST  Result Date: 07/23/2020 CLINICAL DATA:  Follow-up right MCA infarct. History of metastatic melanoma. EXAM: MRI HEAD WITHOUT AND WITH CONTRAST TECHNIQUE: Multiplanar, multiecho pulse sequences of the brain and surrounding structures were obtained without and with intravenous contrast. CONTRAST:  109mL GADAVIST GADOBUTROL 1 MMOL/ML IV  SOLN COMPARISON:  Head CT, CTA, and CTP 07/22/2020.  Head MRI 06/15/2020. FINDINGS: Brain: There are multifocal acute right MCA territory infarcts including a 7 cm infarct involving the right temporoparietal region. Smaller acute infarcts are noted in the high frontoparietal region, right centrum semiovale, anterior right temporal lobe, right occipital lobe, and right caudate nucleus. There is petechial hemorrhage associated with the anterior right temporal, right caudate, and right temporoparietal infarcts. A 1.3 cm lesion in the anteroinferior right temporal lobe (series 18, image 10) and 1.8 cm lesion in the anterior left temporal lobe (series 18, image 19) are unchanged in size from the prior MRI and demonstrate intrinsic T1 hyperintensity. Mild vasogenic edema associated with both lesions has mildly decreased from the prior MRI. A 3 mm intrinsically T1 hyperintense lesion laterally in the right temporal lobe is stable to minimally smaller (series 18, image 17), and a 3 mm enhancing lesion in the anterior left frontal lobe is unchanged (series 18, image 23). A 4 mm enhancing lesion in the right frontal operculum has mildly enlarged (series 18, image 26, previously  2 mm). Leptomeningeal enhancement laterally over the left frontal and temporal lobes has not significantly changed. New leptomeningeal enhancement in the right temporoparietal region is attributed to the acute infarct rather than metastatic disease. The ventricles are normal in size. There is no midline shift or extra-axial fluid collection. Vascular: Major intracranial vascular flow voids are preserved. Skull and upper cervical spine: Unremarkable bone marrow signal. Sinuses/Orbits: Unremarkable orbits. Minimal mucosal thickening in the left maxillary sinus. Clear mastoid air cells. Other: None. IMPRESSION: 1. Multiple acute right MCA territory infarcts, largest in the temporoparietal region. 2. Minimally increased size of 4 mm right frontal operculum  metastasis. 3. Essentially unchanged size of other lesions with decreased vasogenic edema in the temporal lobes. 4. Unchanged left cerebral leptomeningeal enhancement. Electronically Signed   By: Logan Bores M.D.   On: 07/23/2020 19:30   PHYSICAL EXAM ASSESSMENT/PLAN General - Well nourished, well developed, in no apparent distress.  Ophthalmologic - fundi not visualized due to noncooperation.  Cardiovascular - Regular rhythm and rate.  Mental Status -  Level of arousal and orientation to time, place, and person were intact. Language including expression, naming, repetition, comprehension was assessed and found intact.  Cranial Nerves II - XII - II - Visual field testing showed left lower quadrantanopia, left upper quadrant simutalgnosia. III, IV, VI - Extraocular movements intact. V - Facial sensation intact bilaterally. VII - mild left facial droop. VIII - Hearing & vestibular intact bilaterally. X - Palate elevates symmetrically XI - Chin turning & shoulder shrug intact bilaterally. XII - Tongue protrusion intact.  Motor Strength - The patient's strength was normal in RUE and RLE, however, left UE proximal 3-/5 and distal 3/5. Left LE 5-/5 proximal and distal and pronator drift was absent.  Bulk was normal and fasciculations were absent.   Motor Tone - Muscle tone was assessed at the neck and appendages and was normal.  Reflexes - The patient's reflexes were symmetrical in all extremities and he had no pathological reflexes.  Sensory - Light touch, temperature/pinprick were assessed and were symmetrical.    Coordination - The patient had normal movements in the right hand with no ataxia or dysmetria.  Tremor was absent. Wound VAC RLE impairs ability to test.   Gait and Station - deferred.  Adam Bender. is a 60 y.o. male with a history of metastatic melanoma including metastasis to the brain and  recently discharged from [place] on 07/20/20 for thrombectomy with  wound VAC placement, on lovenox and ASA who presented with left-sided weakness that started abruptly at 8:30 PM on 2/19.  At that time, he was talking to his wife when he suddenly began talking like he had "marbles in his mouth."  She called 911 and when EMS was taken way, he was completely flaccid on the left side.  He was brought in and a code stroke was activated.  He was taken for CT which demonstrated stable metastasis, CTA reveals a M3 occlusion with some of the area already infarcted.  Stroke - Acute right MCA embolic infarct - likely due to hyercoagulable state with advance malignancy vs. atrial fibrillation vs. paradoxical embolus   Code Stroke CT head - No acute Finding, known metastatic disease   CTA head & neck: right M3 branch occlusion with associated ischemia  CTP 9cc core infarct with 19cc penumbra  MRI w/wo showed multiple acute right MCA territory infarcts, largest in the temporoparietal region. Minimally increased size of 4 mm right frontal operculum metastasis. Essentially unchanged size  of other lesions with decreased vasogenic edema in the temporal lobe. Unchanged left cerebral leptomeningeal enhancement  2D Echo 06/17/2020, EF 60-65%  LDL 65   A1C 6.4  On Lovenox 60mg  BID with ASA 81mg  daily PTA, now on lovenox 80mg  bid and ASA 81mg . Continue on discharge.   Therapy recommendations:  CIR  Disposition:  TBD  Metastatic melanoma  Follows with Dr. Hinton Rao.    Metastases to brain, pulmonary nodules, left axillary nodules, on steroid taper  decadron taper: 4 mg x4 days (last day of 4 mg is 07/23/20), then 2 mg x7 days (2/21-2/27), 1 mg x7days (2/28-3/6)  MRI 06/15/20 - mixed treatment response with enlarged right inferior temporal lobe lesion and new right frontal operculum lesion and left frontal leptomenigeal enhancement. Interval resolution of leptomeningeal enhancement the left postcentral sulcus.   Was on eliquis 2.5mg  bid and ASA prior 06/2020 admission to [facility],  was on lovenox 60mg  bid with ASA PTA  Now on heparin IV and ASA -> lovenox 80 bid and ASA  Palliative care on board  Chronic AF  Was on eliquis 2.5mg  bid and ASA prior 06/2020 admission to [facility] was on lovenox 60mg  bid with ASA PTA  Now on heparin IV and ASA -> lovenox 80 bid and ASA  B/l DVT  Treated at [facility] 06/2020  eilquis 2.5mg  bid and ASA changed to lovenox 60mg  bid and ASA  Now on heparin IV and ASA-> lovenox 80 bid and ASA  Hyperlipidemia  Home meds:  None   LDL within goal < 70 at 65  No statin needed at this time give LDL at goal and metastasis malignancy  Other Stroke Risk Factors  Former Cigarette smoker  Other Active Problems  Hypokalemia - supplement  Leukocytosis - WBC 24.6->23.3->22.4  Anemia of chronic disease - Hb 9.5->7.9->8.9->8.6  Hospital day # 1   ATTENDING NOTE: I reviewed above note and agree with the assessment and plan. Pt was seen and examined.   Wife at the bedside. Pt drowsy sleepy but able to open eyes with voice. No significant neuro changes. MRI with and without contrast showed right MCA moderate sized infarcts, with largely unchanged brain metastasis. Currently heparin IV discontinued and switched to lovenox 80 bid with ASA 81. Pt recurrent embolic event despite on anticoagulation indicates hypercoagulable stat with advanced malignancy, although afib as a contributing factor. Unfortunately, no further recommendations from neuro standpoint. PT/OT recommend CIR. Pt will continue to follow up with oncology. Detailed assessment and plan please refer to above where I made changes when necessary.   Neurology will sign off. Please call with questions. Thanks for the consult.   Rosalin Hawking, MD PhD Stroke Neurology 07/24/2020 8:48 AM     To contact Stroke Continuity provider, please refer to [email or web address] After hours, contact General Neurology

## 2020-07-24 NOTE — Progress Notes (Signed)
FPTS Interim Progress Note  S: Went to check on patient, and he was sitting comfortably in bed watching TV.  He states that his pain is currently controlled, and that this is the best he has felt while being here. He endorses feeling like he can walk just fine on it right now. Denies any concerns or complaints.  O: BP 131/75   Pulse 95   Temp 98.4 F (36.9 C) (Oral)   Resp 16   Ht 5\' 3"  (1.6 m)   Wt 73.7 kg   SpO2 99%   BMI 28.79 kg/m    Physical exam General: sitting comfortably in bed, NAD Cardiovascular: RRR, no murmurs Lungs: Breathing comfortably on room air  Abdomen: soft, non-distended Lower extremities: RLE with heating pad. Erythematous. warm to the touch. Unable to palpate R DP pulse   A/P:  Concern for lower extremity thrombus causing critical limb ischemia Patient denies current pain of lower extremities. Dr. Oneida Alar with vascular surgery consulted- no significant clot in R leg, but there are clots in his left leg- no intervention necessary at this time - f/u latic acid - s/p dilaudid 1mg  - continue to monitor   Shary Key, DO 07/24/2020, 9:13 PM PGY-1, [place] Medicine Service pager [phone]

## 2020-07-24 NOTE — Progress Notes (Signed)
Inpatient Rehab Admissions Coordinator Note:   Per therapy recommendations, pt was screened for CIR candidacy by Girlie Veltri, MS CCC-SLP. At this time, Pt. Appears to have functional decline and is a good candidate for CIR. Will request order for rehab consult per protocol.  Please contact me with questions.   Cambridge Deleo, MS, CCC-SLP Rehab Admissions Coordinator  [phone] (celll) [phone] (office)  

## 2020-07-24 NOTE — Progress Notes (Addendum)
ANTICOAGULATION CONSULT NOTE - Initial Consult  Pharmacy Consult for Heparin to enoxaparin Indication: atrial fibrillation, recent venous and arterial clot, acute CVA   No Known Allergies  Patient Measurements: Height: 5\' 3"  (160 cm) Weight: 73.7 kg (162 lb 8 oz) IBW/kg (Calculated) : 56.9 Heparin Dosing Weight: 72kg  Vital Signs: BP: 105/72 (02/21 0900) Pulse Rate: 52 (02/21 0900)  Labs: Recent Labs    07/22/20 2200 07/22/20 2212 07/23/20 0310 07/23/20 0807 07/23/20 1711 07/24/20 0100 07/24/20 0241  HGB 8.7*   < > 7.9*  --  8.9*  --  8.6*  HCT 29.0*   < > 25.0*  --  28.9*  --  27.1*  PLT 245  --  201  --   --   --  254  APTT 28  --   --   --   --   --   --   LABPROT 13.0  --   --   --   --   --   --   INR 1.0  --   --   --   --   --   --   HEPARINUNFRC  --   --   --  0.21* 0.13* 0.20*  --   CREATININE 0.72   < > 0.68  --  0.74  --  0.76   < > = values in this interval not displayed.    Estimated Creatinine Clearance: 89.4 mL/min (by C-G formula based on SCr of 0.76 mg/dL).   Medical History: Past Medical History:  Diagnosis Date  . Atrial fibrillation ([place]) 02/29/2020  . Depressed left ventricular ejection fraction 02/29/2020  . Malignant melanoma of other part of trunk ([place]) 02/20/2020  . Malignant melanoma of skin of trunk, except scrotum [facility])     Assessment: 60 y.o. M with acute CVA. Pt on Lovenox 60mg  SQ q12h PTA for atrial fibrillation with recent venous and arterial clot status post R iliac thrombectomy with fasciotomy at [facility] 07/04/20. Patient had bleeding at site requiring RBCs. Last dose of Lovenox given 2/19 0930. Pharmacy consulted for heparin with lower goal rate of 0.3-0.5 units/ml (neurology approved).  HL 0.2 still subtherapeutic.  No issues with infusion or bleeding per RN. H/H , plt stable.   ADDENDUM 10:30: transition to enoxaparin 80mg  Q12 hr (increased from PTA 60mg  Q12 hr) per Heme/Onc recs. Stop heparin and give enoxaparin   Goal of Therapy:   Heparin level 0.3-0.5 units/ml Monitor platelets by anticoagulation protocol: Yes   Plan:  Stop heparin Start enoxaparin 80mg  Q12 hr Monitor for signs/symptoms of bleeding    Benetta Spar, PharmD, BCPS, BCCP Clinical Pharmacist  Please check AMION for all [place] phone numbers After 10:00 PM, call [place] [phone]

## 2020-07-24 NOTE — Progress Notes (Signed)
ANTICOAGULATION CONSULT NOTE  Pharmacy Consult for Heparin Indication: atrial fibrillation, recent venous and arterial clot, acute CVA   Patient Measurements: Height: 5\' 3"  (160 cm) Weight: 73.7 kg (162 lb 8 oz) IBW/kg (Calculated) : 56.9 Heparin Dosing Weight: TBW  Vital Signs: Temp: 98.4 F (36.9 C) (02/20 1936) Temp Source: Oral (02/20 1936) BP: 118/80 (02/20 2330) Pulse Rate: 81 (02/20 2330)  Labs: Recent Labs    07/22/20 2200 07/22/20 2212 07/23/20 0310 07/23/20 0807 07/23/20 1711 07/24/20 0100  HGB 8.7* 9.5* 7.9*  --  8.9*  --   HCT 29.0* 28.0* 25.0*  --  28.9*  --   PLT 245  --  201  --   --   --   APTT 28  --   --   --   --   --   LABPROT 13.0  --   --   --   --   --   INR 1.0  --   --   --   --   --   HEPARINUNFRC  --   --   --  0.21* 0.13* 0.20*  CREATININE 0.72 0.60* 0.68  --  0.74  --     Estimated Creatinine Clearance: 89.4 mL/min (by C-G formula based on SCr of 0.74 mg/dL).   Assessment: 60 y.o. M presented as code stroke. Patient was on Lovenox 60mg  SQ q12h PTA for atrial fibrillation with recent venous and arterial clot status post vascular surgery 2 weeks ago. Last dose of Lovenox given 2/19 0930. Pharmacy was consulted to start IV heparin per pharmacy with lower goal rate of 0.3-0.5 units/ml (neurology approved).  Heparin level remains low (0.2) on gtt at 1300 units/hr. No issues with line or bleeding reported per RN. Hgb low but stable (8.9).  Goal of Therapy:  Heparin level 0.3-0.5 units/ml Monitor platelets by anticoagulation protocol: Yes   Plan:  Increase Heparin gtt to 1450 units/hr. No bolus.  Will f/u 6 hr heparin level   Sherlon Handing, PharmD, BCPS Please see amion for complete clinical pharmacist phone list  07/24/2020 1:49 AM

## 2020-07-24 NOTE — Progress Notes (Signed)
Spoke with Dr Hinton Rao, patients oncologist.  She has no further recommendations for anticoagulation.  Can possibly go up on Lovenox to 80 mg BID with ASA 81 mg daily.  Carollee Leitz, MD [facility]

## 2020-07-25 ENCOUNTER — Inpatient Hospital Stay (HOSPITAL_COMMUNITY): Payer: 59 | Admitting: Certified Registered"

## 2020-07-25 ENCOUNTER — Encounter (HOSPITAL_COMMUNITY): Payer: Self-pay | Admitting: Family Medicine

## 2020-07-25 ENCOUNTER — Other Ambulatory Visit: Payer: Self-pay | Admitting: Radiation Therapy

## 2020-07-25 ENCOUNTER — Encounter (HOSPITAL_COMMUNITY)
Admission: EM | Disposition: A | Discharge status: Home Health | Payer: Self-pay | Source: Home / Self Care | Attending: Family Medicine

## 2020-07-25 ENCOUNTER — Inpatient Hospital Stay (HOSPITAL_COMMUNITY): Payer: 59

## 2020-07-25 DIAGNOSIS — C7931 Secondary malignant neoplasm of brain: Secondary | ICD-10-CM | POA: Diagnosis not present

## 2020-07-25 DIAGNOSIS — I82210 Acute embolism and thrombosis of superior vena cava: Secondary | ICD-10-CM

## 2020-07-25 DIAGNOSIS — R200 Anesthesia of skin: Secondary | ICD-10-CM

## 2020-07-25 DIAGNOSIS — I639 Cerebral infarction, unspecified: Secondary | ICD-10-CM

## 2020-07-25 DIAGNOSIS — I4891 Unspecified atrial fibrillation: Secondary | ICD-10-CM | POA: Diagnosis not present

## 2020-07-25 DIAGNOSIS — I745 Embolism and thrombosis of iliac artery: Secondary | ICD-10-CM

## 2020-07-25 HISTORY — PX: THROMBECTOMY ILIAC ARTERY: SHX6405

## 2020-07-25 HISTORY — PX: EMBOLECTOMY: SHX44

## 2020-07-25 HISTORY — PX: AORTOGRAM: SHX6300

## 2020-07-25 HISTORY — PX: INSERTION OF ILIAC STENT: SHX6256

## 2020-07-25 LAB — BLOOD CULTURE ID PANEL (REFLEXED) - BCID2

## 2020-07-25 LAB — POCT I-STAT EG7
Acid-base deficit: 2 mmol/L (ref 0.0–2.0)
Acid-base deficit: 1 mmol/L (ref 0.0–2.0)
Bicarbonate: 22.2 mmol/L (ref 20.0–28.0)
Bicarbonate: 24.2 mmol/L (ref 20.0–28.0)
Calcium, Ion: 1.18 mmol/L (ref 1.15–1.40)
Calcium, Ion: 1.08 mmol/L — ABNORMAL LOW (ref 1.15–1.40)
HCT: 20 % — ABNORMAL LOW (ref 39.0–52.0)
HCT: 30 % — ABNORMAL LOW (ref 39.0–52.0)
Hemoglobin: 6.8 g/dL — CL (ref 13.0–17.0)
Hemoglobin: 10.2 g/dL — ABNORMAL LOW (ref 13.0–17.0)
O2 Saturation: 97 %
O2 Saturation: 96 %
Patient temperature: 37.4
Potassium: 4.5 mmol/L (ref 3.5–5.1)
Potassium: 5.2 mmol/L — ABNORMAL HIGH (ref 3.5–5.1)
Sodium: 133 mmol/L — ABNORMAL LOW (ref 135–145)
Sodium: 134 mmol/L — ABNORMAL LOW (ref 135–145)
TCO2: 23 mmol/L (ref 22–32)
TCO2: 25 mmol/L (ref 22–32)
pCO2, Ven: 37 mmHg — ABNORMAL LOW (ref 44.0–60.0)
pCO2, Ven: 42.3 mmHg — ABNORMAL LOW (ref 44.0–60.0)
pH, Ven: 7.387 (ref 7.250–7.430)
pH, Ven: 7.366 (ref 7.250–7.430)
pO2, Ven: 93 mmHg — ABNORMAL HIGH (ref 32.0–45.0)
pO2, Ven: 84 mmHg — ABNORMAL HIGH (ref 32.0–45.0)

## 2020-07-25 LAB — CBC
HCT: 25.4 % — ABNORMAL LOW (ref 39.0–52.0)
HCT: 28.9 % — ABNORMAL LOW (ref 39.0–52.0)
Hemoglobin: 8.2 g/dL — ABNORMAL LOW (ref 13.0–17.0)
Hemoglobin: 9.4 g/dL — ABNORMAL LOW (ref 13.0–17.0)
MCH: 28.9 pg (ref 26.0–34.0)
MCH: 29.5 pg (ref 26.0–34.0)
MCHC: 32.3 g/dL (ref 30.0–36.0)
MCHC: 32.5 g/dL (ref 30.0–36.0)
MCV: 89.4 fL (ref 80.0–100.0)
MCV: 90.6 fL (ref 80.0–100.0)
Platelets: 241 10*3/uL (ref 150–400)
Platelets: 202 10*3/uL (ref 150–400)
RBC: 2.84 MIL/uL — ABNORMAL LOW (ref 4.22–5.81)
RBC: 3.19 MIL/uL — ABNORMAL LOW (ref 4.22–5.81)
RDW: 14.9 % (ref 11.5–15.5)
RDW: 14.2 % (ref 11.5–15.5)
WBC: 19.4 10*3/uL — ABNORMAL HIGH (ref 4.0–10.5)
WBC: 18.6 10*3/uL — ABNORMAL HIGH (ref 4.0–10.5)
nRBC: 0.1 % (ref 0.0–0.2)
nRBC: 0 % (ref 0.0–0.2)

## 2020-07-25 LAB — GLUCOSE, CAPILLARY
Glucose-Capillary: 107 mg/dL — ABNORMAL HIGH (ref 70–99)
Glucose-Capillary: 109 mg/dL — ABNORMAL HIGH (ref 70–99)
Glucose-Capillary: 208 mg/dL — ABNORMAL HIGH (ref 70–99)

## 2020-07-25 LAB — POCT ACTIVATED CLOTTING TIME
Activated Clotting Time: 142 seconds
Activated Clotting Time: 243 seconds
Activated Clotting Time: 249 seconds
Activated Clotting Time: 273 seconds

## 2020-07-25 LAB — BASIC METABOLIC PANEL
Anion gap: 11 (ref 5–15)
Anion gap: 11 (ref 5–15)
BUN: 18 mg/dL (ref 6–20)
BUN: 17 mg/dL (ref 6–20)
CO2: 24 mmol/L (ref 22–32)
CO2: 21 mmol/L — ABNORMAL LOW (ref 22–32)
Calcium: 8.3 mg/dL — ABNORMAL LOW (ref 8.9–10.3)
Calcium: 7.8 mg/dL — ABNORMAL LOW (ref 8.9–10.3)
Chloride: 98 mmol/L (ref 98–111)
Chloride: 102 mmol/L (ref 98–111)
Creatinine, Ser: 0.83 mg/dL (ref 0.61–1.24)
Creatinine, Ser: 0.77 mg/dL (ref 0.61–1.24)
GFR, Estimated: >60 mL/min (ref >60)
GFR, Estimated: >60 mL/min (ref >60)
Glucose, Bld: 115 mg/dL — ABNORMAL HIGH (ref 70–99)
Glucose, Bld: 113 mg/dL — ABNORMAL HIGH (ref 70–99)
Potassium: 4.2 mmol/L (ref 3.5–5.1)
Potassium: 4.4 mmol/L (ref 3.5–5.1)
Sodium: 133 mmol/L — ABNORMAL LOW (ref 135–145)
Sodium: 134 mmol/L — ABNORMAL LOW (ref 135–145)

## 2020-07-25 LAB — ABO/RH: ABO/RH(D): A POS

## 2020-07-25 LAB — PREPARE RBC (CROSSMATCH)

## 2020-07-25 LAB — HEPARIN LEVEL (UNFRACTIONATED)
Heparin Unfractionated: 0.28 IU/mL — ABNORMAL LOW (ref 0.30–0.70)
Heparin Unfractionated: 0.21 IU/mL — ABNORMAL LOW (ref 0.30–0.70)
Heparin Unfractionated: 0.6 IU/mL (ref 0.30–0.70)

## 2020-07-25 SURGERY — EMBOLECTOMY
Anesthesia: General | Laterality: Left

## 2020-07-25 MED ORDER — OXYCODONE HCL 5 MG PO TABS: 5.0000 mg | ORAL_TABLET | Freq: Once | ORAL | Status: DC | PRN

## 2020-07-25 MED ORDER — MIDAZOLAM HCL 2 MG/2ML IJ SOLN
INTRAMUSCULAR | Status: AC
  Filled 2020-07-25: qty 2

## 2020-07-25 MED ORDER — SODIUM CHLORIDE 0.9 % IV SOLN
INTRAVENOUS | Status: AC
  Filled 2020-07-25: qty 1.2

## 2020-07-25 MED ORDER — 0.9 % SODIUM CHLORIDE (POUR BTL) OPTIME
TOPICAL | Status: DC | PRN
  Administered 2020-07-25: 2000 mL

## 2020-07-25 MED ORDER — LACTATED RINGERS IV SOLN: INTRAVENOUS | Status: DC | PRN

## 2020-07-25 MED ORDER — HEPARIN (PORCINE) 25000 UT/250ML-% IV SOLN
1400.0000 [IU]/h | INTRAVENOUS | Status: DC
  Filled 2020-07-25: qty 250

## 2020-07-25 MED ORDER — ONDANSETRON HCL 4 MG/2ML IJ SOLN
INTRAMUSCULAR | Status: AC
  Filled 2020-07-25: qty 2

## 2020-07-25 MED ORDER — DEXAMETHASONE SODIUM PHOSPHATE 10 MG/ML IJ SOLN
INTRAMUSCULAR | Status: AC
  Filled 2020-07-25: qty 1

## 2020-07-25 MED ORDER — LACTATED RINGERS IV SOLN: INTRAVENOUS | Status: DC

## 2020-07-25 MED ORDER — CHLORHEXIDINE GLUCONATE 0.12 % MT SOLN
OROMUCOSAL | Status: AC
  Administered 2020-07-25: 15 mL via OROMUCOSAL
  Filled 2020-07-25: qty 15

## 2020-07-25 MED ORDER — SUCCINYLCHOLINE CHLORIDE 200 MG/10ML IV SOSY
PREFILLED_SYRINGE | INTRAVENOUS | Status: AC
  Filled 2020-07-25: qty 10

## 2020-07-25 MED ORDER — SUCCINYLCHOLINE 20MG/ML (10ML) SYRINGE FOR MEDFUSION PUMP - OPTIME
INTRAMUSCULAR | Status: DC | PRN
  Administered 2020-07-25: 100 mg via INTRAVENOUS

## 2020-07-25 MED ORDER — SODIUM CHLORIDE (PF) 0.9 % IJ SOLN
INTRAMUSCULAR | Status: AC
  Filled 2020-07-25: qty 10

## 2020-07-25 MED ORDER — FENTANYL CITRATE (PF) 100 MCG/2ML IJ SOLN: 25.0000 ug | INTRAMUSCULAR | Status: DC | PRN

## 2020-07-25 MED ORDER — SUFENTANIL CITRATE 50 MCG/ML IV SOLN
INTRAVENOUS | Status: DC | PRN
  Administered 2020-07-25: 5 ug via INTRAVENOUS
  Administered 2020-07-25: 10 ug via INTRAVENOUS
  Administered 2020-07-25: 5 ug via INTRAVENOUS

## 2020-07-25 MED ORDER — HEPARIN SODIUM (PORCINE) 1000 UNIT/ML IJ SOLN
INTRAMUSCULAR | Status: DC | PRN
  Administered 2020-07-25: 8000 [IU] via INTRAVENOUS
  Administered 2020-07-25: 5000 [IU] via INTRAVENOUS

## 2020-07-25 MED ORDER — LIDOCAINE 2% (20 MG/ML) 5 ML SYRINGE
INTRAMUSCULAR | Status: DC | PRN
  Administered 2020-07-25: 100 mg via INTRAVENOUS

## 2020-07-25 MED ORDER — ROCURONIUM BROMIDE 10 MG/ML (PF) SYRINGE
PREFILLED_SYRINGE | INTRAVENOUS | Status: AC
  Filled 2020-07-25: qty 10

## 2020-07-25 MED ORDER — MIDAZOLAM HCL 5 MG/5ML IJ SOLN
INTRAMUSCULAR | Status: DC | PRN
  Administered 2020-07-25: 2 mg via INTRAVENOUS

## 2020-07-25 MED ORDER — CEFAZOLIN SODIUM-DEXTROSE 2-3 GM-%(50ML) IV SOLR
INTRAVENOUS | Status: DC | PRN
  Administered 2020-07-25: 2 g via INTRAVENOUS

## 2020-07-25 MED ORDER — ONDANSETRON HCL 4 MG/2ML IJ SOLN: 4.0000 mg | Freq: Four times a day (QID) | INTRAMUSCULAR | Status: DC | PRN

## 2020-07-25 MED ORDER — SUFENTANIL CITRATE 50 MCG/ML IV SOLN
INTRAVENOUS | Status: AC
  Filled 2020-07-25: qty 1

## 2020-07-25 MED ORDER — PROPOFOL 10 MG/ML IV BOLUS
INTRAVENOUS | Status: AC
  Filled 2020-07-25: qty 20

## 2020-07-25 MED ORDER — SODIUM CHLORIDE 0.9 % IV SOLN
INTRAVENOUS | Status: DC | PRN
  Administered 2020-07-25: 500 mL

## 2020-07-25 MED ORDER — SODIUM CHLORIDE 0.9 % IV SOLN: INTRAVENOUS | Status: DC

## 2020-07-25 MED ORDER — LIDOCAINE 2% (20 MG/ML) 5 ML SYRINGE
INTRAMUSCULAR | Status: AC
  Filled 2020-07-25: qty 5

## 2020-07-25 MED ORDER — IODIXANOL 320 MG/ML IV SOLN
INTRAVENOUS | Status: DC | PRN
  Administered 2020-07-25: 75 mL
  Administered 2020-07-25: 50 mL

## 2020-07-25 MED ORDER — ONDANSETRON HCL 4 MG/2ML IJ SOLN
INTRAMUSCULAR | Status: DC | PRN
Start: 2020-07-25 — End: 2020-07-25
  Administered 2020-07-25: 4 mg via INTRAVENOUS

## 2020-07-25 MED ORDER — ALBUMIN HUMAN 5 % IV SOLN: INTRAVENOUS | Status: DC | PRN

## 2020-07-25 MED ORDER — SODIUM CHLORIDE 0.9 % IV SOLN: 10.0000 mL/h | Freq: Once | INTRAVENOUS | Status: DC

## 2020-07-25 MED ORDER — SUGAMMADEX SODIUM 200 MG/2ML IV SOLN
INTRAVENOUS | Status: DC | PRN
  Administered 2020-07-25: 100 mg via INTRAVENOUS

## 2020-07-25 MED ORDER — OXYCODONE HCL 5 MG/5ML PO SOLN: 5.0000 mg | Freq: Once | ORAL | Status: DC | PRN

## 2020-07-25 MED ORDER — PROPOFOL 10 MG/ML IV BOLUS
INTRAVENOUS | Status: DC | PRN
  Administered 2020-07-25: 80 mg via INTRAVENOUS

## 2020-07-25 MED ORDER — CHLORHEXIDINE GLUCONATE CLOTH 2 % EX PADS
6.0000 | MEDICATED_PAD | Freq: Every day | CUTANEOUS | Status: DC
  Administered 2020-07-25 – 2020-07-29 (×5): 6 via TOPICAL

## 2020-07-25 MED ORDER — CEFAZOLIN SODIUM 1 G IJ SOLR
INTRAMUSCULAR | Status: AC
  Filled 2020-07-25: qty 20

## 2020-07-25 MED ORDER — CHLORHEXIDINE GLUCONATE 0.12 % MT SOLN: 15.0000 mL | Freq: Once | OROMUCOSAL | Status: AC

## 2020-07-25 MED ORDER — PHENYLEPHRINE HCL-NACL 10-0.9 MG/250ML-% IV SOLN
INTRAVENOUS | Status: DC | PRN
  Administered 2020-07-25: 50 ug/min via INTRAVENOUS

## 2020-07-25 MED ORDER — ROCURONIUM BROMIDE 10 MG/ML (PF) SYRINGE
PREFILLED_SYRINGE | INTRAVENOUS | Status: DC | PRN
  Administered 2020-07-25 (×2): 50 mg via INTRAVENOUS

## 2020-07-25 SURGICAL SUPPLY — 75 items
ADH SKN CLS APL DERMABOND .7 (GAUZE/BANDAGES/DRESSINGS) ×2
BAG SNAP BAND KOVER 36X36 (MISCELLANEOUS) ×1 IMPLANT
BALLN MUSTANG 10.0X40 75 (BALLOONS) ×3
BALLOON MUSTANG 10.0X40 75 (BALLOONS) IMPLANT
BANDAGE ESMARK 6X9 LF (GAUZE/BANDAGES/DRESSINGS) IMPLANT
BNDG CMPR 9X6 STRL LF SNTH (GAUZE/BANDAGES/DRESSINGS)
BNDG ESMARK 6X9 LF (GAUZE/BANDAGES/DRESSINGS)
CANISTER SUCT 3000ML PPV (MISCELLANEOUS) ×3 IMPLANT
CANNULA VESSEL 3MM 2 BLNT TIP (CANNULA) ×4 IMPLANT
CATH BEACON 5 .035 65 KMP TIP (CATHETERS) ×1 IMPLANT
CATH EMB 3FR 80CM (CATHETERS) ×2 IMPLANT
CATH EMB 4FR 80CM (CATHETERS) ×2 IMPLANT
CATH EMB 5FR 80CM (CATHETERS) ×1 IMPLANT
CATH OMNI FLUSH .035X70CM (CATHETERS) ×2 IMPLANT
CLIP VESOCCLUDE MED 24/CT (CLIP) ×3 IMPLANT
CLIP VESOCCLUDE SM WIDE 24/CT (CLIP) ×3 IMPLANT
COVER WAND RF STERILE (DRAPES) ×3 IMPLANT
CUFF TOURN SGL QUICK 24 (TOURNIQUET CUFF)
CUFF TOURN SGL QUICK 34 (TOURNIQUET CUFF)
CUFF TOURN SGL QUICK 42 (TOURNIQUET CUFF) IMPLANT
CUFF TRNQT CYL 24X4X16.5-23 (TOURNIQUET CUFF) IMPLANT
CUFF TRNQT CYL 34X4.125X (TOURNIQUET CUFF) IMPLANT
DECANTER SPIKE VIAL GLASS SM (MISCELLANEOUS) IMPLANT
DERMABOND ADVANCED (GAUZE/BANDAGES/DRESSINGS) ×1
DERMABOND ADVANCED .7 DNX12 (GAUZE/BANDAGES/DRESSINGS) IMPLANT
DEVICE INFLATION ENCORE 26 (MISCELLANEOUS) ×2 IMPLANT
DRAIN CHANNEL 15F RND FF W/TCR (WOUND CARE) ×2 IMPLANT
DRAIN SNY 10X20 3/4 PERF (WOUND CARE) IMPLANT
DRAPE X-RAY CASS 24X20 (DRAPES) IMPLANT
ELECT REM PT RETURN 9FT ADLT (ELECTROSURGICAL) ×3
ELECTRODE REM PT RTRN 9FT ADLT (ELECTROSURGICAL) ×2 IMPLANT
EVACUATOR SILICONE 100CC (DRAIN) ×2 IMPLANT
GLOVE BIO SURGEON STRL SZ7.5 (GLOVE) ×3 IMPLANT
GLOVE ECLIPSE 6.0 STRL STRAW (GLOVE) ×1 IMPLANT
GOWN STRL REUS W/ TWL LRG LVL3 (GOWN DISPOSABLE) ×6 IMPLANT
GOWN STRL REUS W/TWL LRG LVL3 (GOWN DISPOSABLE) ×9
GUIDEWIRE ANGLED .035X150CM (WIRE) ×2 IMPLANT
KIT BASIN OR (CUSTOM PROCEDURE TRAY) ×3 IMPLANT
KIT MICROPUNCTURE NIT STIFF (SHEATH) ×2 IMPLANT
KIT TURNOVER KIT B (KITS) ×3 IMPLANT
NDL PERC 18GX7CM (NEEDLE) IMPLANT
NEEDLE PERC 18GX7CM (NEEDLE) ×3 IMPLANT
NS IRRIG 1000ML POUR BTL (IV SOLUTION) ×6 IMPLANT
PACK PERIPHERAL VASCULAR (CUSTOM PROCEDURE TRAY) ×3 IMPLANT
PAD ARMBOARD 7.5X6 YLW CONV (MISCELLANEOUS) ×6 IMPLANT
SET COLLECT BLD 21X3/4 12 (NEEDLE) IMPLANT
SHEATH BRITE TIP 7FR 35CM (SHEATH) ×2 IMPLANT
SHEATH PINNACLE 5F 10CM (SHEATH) ×1 IMPLANT
SPONGE SURGIFOAM ABS GEL 100 (HEMOSTASIS) IMPLANT
STAPLER VISISTAT 35W (STAPLE) IMPLANT
STENT VIABAHN VBX 8X59X135 (Permanent Stent) ×1 IMPLANT
STOPCOCK 4 WAY LG BORE MALE ST (IV SETS) IMPLANT
STOPCOCK MORSE 400PSI 3WAY (MISCELLANEOUS) ×1 IMPLANT
SUT ETHILON 3 0 PS 1 (SUTURE) ×2 IMPLANT
SUT PROLENE 5 0 C 1 24 (SUTURE) ×4 IMPLANT
SUT PROLENE 6 0 CC (SUTURE) ×3 IMPLANT
SUT VIC AB 2-0 CTX 36 (SUTURE) ×3 IMPLANT
SUT VIC AB 3-0 SH 27 (SUTURE) ×3
SUT VIC AB 3-0 SH 27X BRD (SUTURE) ×2 IMPLANT
SUT VIC AB 4-0 PS2 27 (SUTURE) ×1 IMPLANT
SYR 20CC LL (SYRINGE) ×1 IMPLANT
SYR 30ML LL (SYRINGE) ×1 IMPLANT
SYR 3ML LL SCALE MARK (SYRINGE) ×3 IMPLANT
SYR BULB IRRIG 60ML STRL (SYRINGE) ×1 IMPLANT
SYR MEDRAD MARK V 150ML (SYRINGE) ×2 IMPLANT
SYR TB 1ML LUER SLIP (SYRINGE) ×1 IMPLANT
TAPE VIPERTRACK RADIOPAQ 30X (MISCELLANEOUS) IMPLANT
TAPE VIPERTRACK RADIOPAQUE (MISCELLANEOUS) ×3
TOWEL GREEN STERILE (TOWEL DISPOSABLE) ×3 IMPLANT
TRAY FOLEY MTR SLVR 16FR STAT (SET/KITS/TRAYS/PACK) ×3 IMPLANT
TUBING EXTENTION W/L.L. (IV SETS) IMPLANT
TUBING INJECTOR 48 (MISCELLANEOUS) ×1 IMPLANT
UNDERPAD 30X36 HEAVY ABSORB (UNDERPADS AND DIAPERS) ×3 IMPLANT
WATER STERILE IRR 1000ML POUR (IV SOLUTION) ×3 IMPLANT
WIRE HI TORQ VERSACORE J 260CM (WIRE) ×1 IMPLANT

## 2020-07-25 NOTE — Progress Notes (Addendum)
ANTICOAGULATION CONSULT NOTE - Follow Up Consult  Pharmacy Consult for IV Heparin Indication: atrial fibrillation, recent venous and arterial clot, acute CVA   No Known Allergies  Patient Measurements: Height: 5' 2.99" (160 cm) Weight: 73.7 kg (162 lb 8 oz) IBW/kg (Calculated) : 56.88 Heparin Dosing Weight: 72 kg  Vital Signs: Temp: 97.2 F (36.2 C) (02/22 1530) Temp Source: Oral (02/22 0714) BP: 94/54 (02/22 1530) Pulse Rate: 63 (02/22 1530)  Labs: Recent Labs    07/22/20 2200 07/22/20 2212 07/24/20 0100 07/24/20 0241 07/24/20 1554 07/24/20 1810 07/25/20 0347 07/25/20 1009  HGB 8.7*   < >  --  8.6*  --  8.4* 8.2*  --   HCT 29.0*   < >  --  27.1*  --  26.4* 25.4*  --   PLT 245   < >  --  254  --  256 241  --   APTT 28  --   --   --   --   --   --   --   LABPROT 13.0  --   --   --   --   --   --   --   INR 1.0  --   --   --   --   --   --   --   HEPARINUNFRC  --    < > 0.20*  --   --   --  0.28* 0.21*  CREATININE 0.72   < >  --  0.76 0.80 0.82 0.83  --    < > = values in this interval not displayed.    Estimated Creatinine Clearance: 86.2 mL/min (by C-G formula based on SCr of 0.83 mg/dL).   Medical History: Past Medical History:  Diagnosis Date  . Atrial fibrillation ([place]) 02/29/2020  . Depressed left ventricular ejection fraction 02/29/2020  . 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 Malignant melanoma of skin of trunk, except scrotum [facility])     Assessment: 60 yr old man with acute CVA. Pt was on Lovenox 60 mg SQ q12h PTA for atrial fibrillation with recent venous and arterial clot, S/P R iliac thrombectomy with fasciotomy at [facility] 07/04/20. Patient had bleeding at site requiring RBCs. Last dose of Lovenox was given 2/19 0930. Pharmacy was consulted for heparin with lower goal rate of 0.3-0.5 units/ml (neurology approved).  Patient was transitioned from heparin toLovenox yesterday. Pharmacy was consulted to transition pt back to IV heparin for  aortic iliac femoral thrombus despite DOAC and Lovenox use, per vascular (last dose of Lovenox 80 mg was at ~1100 on 07/24/2020).   Pt was on heparin infusion at 1400 units/hr prior to and during surgery today; heparin level on this rate was 0.21 units/ml prior to surgery (below the goal range for this pt). Pt is S/P vascular surgery for acute ischemia in L leg this afternoon. During the surgery, pt rec'd 12,000 units of IV heparin and was given additional heparin during the case to maintain ACT >250, per VVS note (heparin administered ~1300) today). Per PACU RN, heparin infusion was not stopped during the procedure; no bleeding issues post op. H/H earlier today: 8.2/25.4, plt 241 (CBC stable)  Goal of Therapy:  Heparin level 0.3-0.5 units/ml Monitor platelets by anticoagulation protocol: Yes   Plan:  Check heparin level ~6 hrs after heparin administered in surgery before making any adjustments to heparin infusion rate (~1900) Continue heparin infusion at 1400 units/hr at this point Monitor heparin level,  CBC daily Monitor for bleeding Follow up plans for anticoagulation  Gillermina Hu, PharmD, BCPS, [facility] Clinical Pharmacist   ADDENDUM  Heparin level at 2008 this evening was 0.60 units/ml, which is above the goal range for this pt. Most recent Hgb 9.4. Per RN, no issues with IV or bleeding observed.  Plan:  Decrease heparin infusion to 1300 units/hr Check heparin level in 6 hrs

## 2020-07-25 NOTE — Progress Notes (Signed)
ANTICOAGULATION CONSULT NOTE - Follow Up Consult  Pharmacy Consult for heparin Indication: atrial fibrillation, recent venous and arterial clot, and acute stroke  Labs: Recent Labs    07/22/20 2200 07/22/20 2212 07/23/20 1711 07/24/20 0100 07/24/20 0241 07/24/20 1554 07/24/20 1810 07/25/20 0347  HGB 8.7*   < > 8.9*  --  8.6*  --  8.4* 8.2*  HCT 29.0*   < > 28.9*  --  27.1*  --  26.4* 25.4*  PLT 245   < >  --   --  254  --  256 241  APTT 28  --   --   --   --   --   --   --   LABPROT 13.0  --   --   --   --   --   --   --   INR 1.0  --   --   --   --   --   --   --   HEPARINUNFRC  --    < > 0.13* 0.20*  --   --   --  0.28*  CREATININE 0.72   < > 0.74  --  0.76 0.80 0.82  --    < > = values in this interval not displayed.    Assessment/Plan:  60yo male slightly subtherapeutic on heparin after resumed though will likely continue to accumulate. Will continue gtt at current rate of 1400 units/hr and check additional level.   Wynona Neat, PharmD, BCPS  07/25/2020,5:03 AM

## 2020-07-25 NOTE — Plan of Care (Signed)
  Problem: Education: Goal: Knowledge of disease or condition will improve Outcome: Progressing Goal: Knowledge of secondary prevention will improve Outcome: Progressing Goal: Knowledge of patient specific risk factors addressed and post discharge goals established will improve Outcome: Progressing   

## 2020-07-25 NOTE — Anesthesia Procedure Notes (Signed)
Procedure Name: Intubation Date/Time: 07/25/2020 11:50 AM Performed by: Claris Che, CRNA Pre-anesthesia Checklist: Patient identified, Emergency Drugs available, Suction available, Patient being monitored and Timeout performed Patient Re-evaluated:Patient Re-evaluated prior to induction Oxygen Delivery Method: Circle system utilized Preoxygenation: Pre-oxygenation with 100% oxygen Induction Type: IV induction, Cricoid Pressure applied and Rapid sequence Laryngoscope Size: Mac Grade View: Grade II Tube type: Oral Tube size: 7.5 mm Number of attempts: 1 Airway Equipment and Method: Stylet Placement Confirmation: ETT inserted through vocal cords under direct vision,  positive ETCO2 and breath sounds checked- equal and bilateral Secured at: 23 cm Tube secured with: Tape Dental Injury: Teeth and Oropharynx as per pre-operative assessment

## 2020-07-25 NOTE — Progress Notes (Signed)
SLP Cancellation Note  Patient Details Name: Adam Bender. MRN: [id number] DOB: 08-13-1960   Cancelled treatment:       Reason Eval/Treat Not Completed: Other (comment). Pt NPO for upcoming procedure. Will follow up for completion of clinical swallow evaluation after procedure.   [place], CCC-SLP Acute Rehabilitation Services   07/25/2020, 8:47 AM

## 2020-07-25 NOTE — Anesthesia Preprocedure Evaluation (Addendum)
Anesthesia Evaluation  Patient identified by MRN, date of birth, ID band Patient awake    Reviewed: Allergy & Precautions, H&P , NPO status , Patient's Chart, lab work & pertinent test results  Airway Mallampati: II   Neck ROM: full    Dental   Pulmonary former smoker,    breath sounds clear to auscultation       Cardiovascular + Peripheral Vascular Disease  + dysrhythmias Atrial Fibrillation  Rhythm:irregular Rate:Normal  EF 60%   Neuro/Psych CVA    GI/Hepatic   Endo/Other    Renal/GU      Musculoskeletal   Abdominal   Peds  Hematology  (+) anemia ,   Anesthesia Other Findings Metastatic melanoma (brain and lung)  Reproductive/Obstetrics                            Anesthesia Physical Anesthesia Plan  ASA: IV  Anesthesia Plan: General   Post-op Pain Management:    Induction: Intravenous  PONV Risk Score and Plan: 2 and Ondansetron, Dexamethasone, Midazolam and Treatment may vary due to age or medical condition  Airway Management Planned: Oral ETT  Additional Equipment:   Intra-op Plan:   Post-operative Plan: Extubation in OR  Informed Consent: I have reviewed the patients History and Physical, chart, labs and discussed the procedure including the risks, benefits and alternatives for the proposed anesthesia with the patient or authorized representative who has indicated his/her understanding and acceptance.     Dental advisory given  Plan Discussed with: CRNA, Anesthesiologist and Surgeon  Anesthesia Plan Comments:         Anesthesia Quick Evaluation

## 2020-07-25 NOTE — Progress Notes (Addendum)
Patient is allowed 2 visitors at a time, departmental leadership aware due to the need for ongoing goals of care meetings. Will monitor patient. Kameka Whan, Bettina Gavia RN

## 2020-07-25 NOTE — Progress Notes (Signed)
PHARMACY - PHYSICIAN COMMUNICATION CRITICAL VALUE ALERT - BLOOD CULTURE IDENTIFICATION (BCID)  Adam Hagy. is an 60 y.o. male who presented to [facility] on 07/22/2020 with a chief complaint of CVA workup  Assessment: 1/4 BC with Staph epi, could be contaminant  Name of physician (or Provider) Contacted: Arby Barrette  Current antibiotics: none  Changes to prescribed antibiotics recommended:  None  Results for orders placed or performed during the hospital encounter of 07/22/20  Blood Culture ID Panel (Reflexed) (Collected: 07/23/2020  2:53 AM)  Result Value Ref Range   Enterococcus faecalis NOT DETECTED NOT DETECTED   Enterococcus Faecium NOT DETECTED NOT DETECTED   Listeria monocytogenes NOT DETECTED NOT DETECTED   Staphylococcus species DETECTED (A) NOT DETECTED   Staphylococcus aureus (BCID) NOT DETECTED NOT DETECTED   Staphylococcus epidermidis DETECTED (A) NOT DETECTED   Staphylococcus lugdunensis NOT DETECTED NOT DETECTED   Streptococcus species NOT DETECTED NOT DETECTED   Streptococcus agalactiae NOT DETECTED NOT DETECTED   Streptococcus pneumoniae NOT DETECTED NOT DETECTED   Streptococcus pyogenes NOT DETECTED NOT DETECTED   A.calcoaceticus-baumannii NOT DETECTED NOT DETECTED   Bacteroides fragilis NOT DETECTED NOT DETECTED   Enterobacterales NOT DETECTED NOT DETECTED   Enterobacter cloacae complex NOT DETECTED NOT DETECTED   Escherichia coli NOT DETECTED NOT DETECTED   Klebsiella aerogenes NOT DETECTED NOT DETECTED   Klebsiella oxytoca NOT DETECTED NOT DETECTED   Klebsiella pneumoniae NOT DETECTED NOT DETECTED   Proteus species NOT DETECTED NOT DETECTED   Salmonella species NOT DETECTED NOT DETECTED   Serratia marcescens NOT DETECTED NOT DETECTED   Haemophilus influenzae NOT DETECTED NOT DETECTED   Neisseria meningitidis NOT DETECTED NOT DETECTED   Pseudomonas aeruginosa NOT DETECTED NOT DETECTED   Stenotrophomonas maltophilia NOT DETECTED NOT DETECTED   Candida  albicans NOT DETECTED NOT DETECTED   Candida auris NOT DETECTED NOT DETECTED   Candida glabrata NOT DETECTED NOT DETECTED   Candida krusei NOT DETECTED NOT DETECTED   Candida parapsilosis NOT DETECTED NOT DETECTED   Candida tropicalis NOT DETECTED NOT DETECTED   Cryptococcus neoformans/gattii NOT DETECTED NOT DETECTED   Methicillin resistance mecA/C NOT DETECTED NOT DETECTED    Adam Bender 07/25/2020  11:52 PM

## 2020-07-25 NOTE — Anesthesia Postprocedure Evaluation (Signed)
Anesthesia Post Note  Patient: Adam Bender.  Procedure(s) Performed: LEFT ILIOFEMORAL EMBOLECTOMY (Left ) THROMBECTOMY ILIAC ARTERY (Left ) INSERTION OF COMMON ILIAC STENT (Left ) AORTOGRAM with Angiogram     Patient location during evaluation: PACU Anesthesia Type: General Level of consciousness: awake and alert Pain management: pain level controlled Vital Signs Assessment: post-procedure vital signs reviewed and stable Respiratory status: spontaneous breathing, nonlabored ventilation, respiratory function stable and patient connected to nasal cannula oxygen Cardiovascular status: blood pressure returned to baseline and stable Postop Assessment: no apparent nausea or vomiting Anesthetic complications: no   No complications documented.  Last Vitals:  Vitals:   07/25/20 1651 07/25/20 1706  BP: 95/60   Pulse: 80   Resp: 18   Temp: 36.8 C 36.9 C  SpO2: 98%     Last Pain:  Vitals:   07/25/20 1706  TempSrc: Oral  PainSc: 0-No pain                 Nasia Cannan COKER

## 2020-07-25 NOTE — Progress Notes (Signed)
Inpatient Rehabilitation Admissions Coordinator  Inpatient rehab consult received. I will follow up postoperatively to assist with planning dispo options.  Danne Baxter, RN, MSN Rehab Admissions Coordinator [phone] 07/25/2020 4:29 PM

## 2020-07-25 NOTE — Evaluation (Signed)
Occupational Therapy Evaluation Patient Details Name: Adam Bender. MRN: [id number] DOB: 12-Jun-1960 Today's Date: 07/25/2020    History of Present Illness 60yo male presenting with acute CVA symptoms likely due to embolic event and related ischemia. Found to have multiple R MCA CVA acute infarcts. PMH metastatic malignant melanoma with brain mets, A-fib with recent venous and arterial clots s/p thrombectomy, hypercoagulable state due to high level cancer   Clinical Impression   PTA patient was living with his wife in a private residence and was independent with ADLs/IADLs including driving prior to hospital admission at [facility] ~07/06/2020. Since that time patient has required some assist from his wife with ADLs/IADLs without AD. Patient currently presents with deficits including L-hemiparesis greatest in LUE, decreased sensation, decreased coordination, decreased balance, and need for Min A overall for completion of ADLs, ADL transfers, and bed mobility. Patient would benefit from continued acute OT services to maximize safety and independence with self-care tasks in prep for safe d/c to next level of care. Recommendation for intensive CIR.     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 Patient also able to lower trunk to bed sufrace and advance BLE from EOB to bed level with return to supine. No external assist  needed.    Transfers Overall transfer level: Needs assistance Equipment used: Rolling walker (2 wheeled);1 person hand held assist Transfers: Sit to/from Stand Sit to Stand: Min assist         General transfer comment: Light Min A for sit to stand from EOB x2 trials with cues for hand placement and technique.    Balance Overall balance assessment: Needs assistance Sitting-balance support: Single extremity supported;Feet supported Sitting balance-Leahy Scale: Fair Sitting balance - Comments: Patient with mild posterior LOB with donning footwear but able to self correct.   Standing balance support: Single extremity supported;Bilateral upper extremity supported;During functional activity Standing balance-Leahy Scale: Poor Standing balance comment: Reliant on at least unilateral UE support to maintain standing balance.                           ADL either performed or assessed with clinical judgement   ADL Overall ADL's : Needs assistance/impaired                 Upper Body Dressing : Minimal assistance Upper Body Dressing Details (indicate cue type and reason): Donned posterior hospital gown. Lower Body Dressing: Minimal assistance;Sit to/from stand Lower Body Dressing Details (indicate cue type and reason): Min A to doff/don footwear seated EOB. Toilet Transfer: Minimal Insurance claims handler Details (indicate cue type and reason): Simluated with use of RW and hand held assist to maintain position of LUE on RW. Toileting- Clothing Manipulation and Hygiene: Minimal assistance;Sit to/from stand Toileting - Clothing Manipulation Details (indicate cue type and reason): Patient able to stand with use of RW and manage urinal to void bladder with Min A  and hand held assist to maintain position of LUE on RW.       General ADL Comments: Patient limited by L-hemiparesis greater in LUE than in LLE, decreased balance, decreased sensation and decreased coordination.      Vision Baseline Vision/History: Wears glasses Wears Glasses: Reading only Patient Visual Report: No change from baseline Additional Comments: Will assess further at time of next treatment session. Patient leaving floor for surgery.     Perception     Praxis      Pertinent Vitals/Pain Pain Assessment: 0-10 Pain Score: 5  Pain Location: R foot Pain Descriptors / Indicators: Discomfort Pain Intervention(s): Limited activity within patient's tolerance;Monitored during session;Repositioned     Hand Dominance Right   Extremity/Trunk Assessment Upper Extremity Assessment Upper Extremity Assessment: LUE deficits/detail LUE Deficits / Details: MMT 2-/5 at shoulder, elbow, wrist and digits. Patient with good activation at triceps. LUE Sensation: decreased proprioception;decreased light touch LUE Coordination: decreased gross motor;decreased fine motor   Lower Extremity Assessment Lower Extremity Assessment: Defer to PT evaluation   Cervical / Trunk Assessment Cervical / Trunk Assessment: Kyphotic   Communication Communication Communication: No difficulties   Cognition Arousal/Alertness: Awake/alert Behavior During Therapy: Flat affect Overall Cognitive Status: Within Functional Limits for tasks assessed                                 General Comments: Answered questions and followed cues appropriately, A&Ox4   General Comments  Wife and mother present at bedside.    Exercises     Shoulder Instructions      Home Living Family/patient expects to be discharged to:: Private residence Living Arrangements: Spouse/significant other Available Help at Discharge: Family;Available 24 hours/day Type of Home: House Home Access: Stairs to enter [employer] of Steps: 2 STE cannot reach both at the same time   Home Layout: One level               Home Equipment: Hospital bed;Bedside commode;Shower seat;Walker - 4 wheels;Walker - 2 wheels           Prior Functioning/Environment Level of Independence: Needs assistance  Gait / Transfers Assistance Needed: Independent prior to 2/3. ADL's / Homemaking Assistance Needed: Independent prior to admission at [place] 3 weeks ago.   Comments: still needed physical assistance after surgeries at [place] 2 weeks ago; wants to maintain independence as long as possible        OT Problem List: Decreased strength;Decreased range of motion;Decreased activity tolerance;Impaired balance (sitting and/or standing);Decreased coordination;Decreased safety awareness;Decreased knowledge of use of DME or AE;Impaired sensation;Impaired UE functional use;Pain      OT Treatment/Interventions: Self-care/ADL training;Therapeutic exercise;Neuromuscular education;Energy conservation;DME and/or AE instruction;Therapeutic activities;Patient/family education;Balance training    OT Goals(Current goals can be found in the care plan section) Acute Rehab OT Goals Patient Stated Goal: be as independent as possible at home OT Goal Formulation: With patient Time For Goal Achievement: Aug 25, 2020 Potential to Achieve Goals: Good ADL Goals Pt Will Perform Grooming: with supervision;standing Pt Will Perform Upper Body Dressing: with set-up;sitting Pt Will Perform Lower Body Dressing: with supervision;sit to/from stand Pt Will Transfer to Toilet: with supervision;ambulating Pt Will Perform Toileting - Clothing Manipulation and hygiene: with supervision;sit to/from stand Pt Will Perform Tub/Shower Transfer: with supervision;shower seat;rolling walker Additional ADL Goal #1: Patient will demo self-ROM HEP with I and use of written handout to improve strength and coordination in prep for ADLs.  OT Frequency: Min  2X/week   Barriers to D/C:            Co-evaluation              AM-PAC OT "6 Clicks" Daily Activity     Outcome Measure Help from another person eating meals?: A Little Help from another person taking care  of personal grooming?: A Little Help from another person toileting, which includes using toliet, bedpan, or urinal?: A Little Help from another person bathing (including washing, rinsing, drying)?: A Lot Help from another person to put on and taking off regular upper body clothing?: A Little Help from another person to put on and taking off regular lower body clothing?: A Little 6 Click Score: 17   End of Session Equipment Utilized During Treatment: Gait belt;Rolling walker Nurse Communication: Mobility status  Activity Tolerance: Patient tolerated treatment well Patient left: in bed;with call bell/phone within reach;with chair alarm set;with family/visitor present (Wife and mother present at bedside.)  OT Visit Diagnosis: Unsteadiness on feet (R26.81);Other abnormalities of gait and mobility (R26.89);Muscle weakness (generalized) (M62.81);Hemiplegia and hemiparesis;Pain Hemiplegia - Right/Left: Left Hemiplegia - dominant/non-dominant: Non-Dominant Hemiplegia - caused by: Cerebral infarction Pain - Right/Left: Right Pain - part of body: Leg;Ankle and joints of foot                Time: 5809-9833 OT Time Calculation (min): 33 min Charges:  OT General Charges $OT Visit: 1 Visit OT Evaluation $OT Eval Moderate Complexity: 1 Mod OT Treatments $Self Care/Home Management : 8-22 mins  Iwalani Templeton H. OTR/L Supplemental OT, Department of rehab services [phone]  Shiloh Swopes R H. 07/25/2020, 10:32 AM

## 2020-07-25 NOTE — Transfer of Care (Signed)
Immediate Anesthesia Transfer of Care Note  Patient: Adam Bender.  Procedure(s) Performed: LEFT ILIOFEMORAL EMBOLECTOMY (Left ) THROMBECTOMY ILIAC ARTERY (Left ) INSERTION OF COMMON ILIAC STENT (Left ) AORTOGRAM with Angiogram  Patient Location: PACU  Anesthesia Type:General  Level of Consciousness: drowsy and patient cooperative  Airway & Oxygen Therapy: Patient Spontanous Breathing  Post-op Assessment: Report given to RN, Post -op Vital signs reviewed and stable and Patient moving all extremities X 4  Post vital signs: Reviewed and stable  Last Vitals:  Vitals Value Taken Time  BP 94/54 07/25/20 1526  Temp    Pulse 77 07/25/20 1528  Resp 14 07/25/20 1528  SpO2 93 % 07/25/20 1528  Vitals shown include unvalidated device data.  Last Pain:  Vitals:   07/25/20 0827  TempSrc:   PainSc: 0-No pain      Patients Stated Pain Goal: 0 (06/77/03 4035)  Complications: No complications documented.

## 2020-07-25 NOTE — Consult Note (Signed)
Physical Medicine and Rehabilitation Consult Reason for Consult: Left side weakness Referring Physician: Internal medicine  HPI: Zell Hylton. is a 60 y.o. 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  Patient had undergone redo thrombectomy of his right femoral iliac system on July 07, 2020 at [facility] and a wound VAC was applied.Marland Kitchen History taken from chart review, patient, and family.  Patient lives with spouse.  1 level home 2 steps to entry.  Patient did need physical assistance prior to admission.  He presented on 07/22/2018 acute left hemiparesis and dysarthria.  CT head showing bilateral temporal lobe metastatic disease.  Subtle architectural distortion of the inferior temporal lobe on the right.  Some volume loss and distortion of the inferior and anterior left temporal lobe.  CT angiogram of head and neck occlusion of distal right M3 branch in the inferior right parietal region.  9 mL core infarct with slight loss of gray-white differentiation at the inferior right parietal region.  No carotid bifurcation disease.  Hilar and mediastinal lymphadenopathy consistent with diagnosis of metastatic disease.  Patient did not receive TPA.  MRI of the brain showed multiple acute right MCA territory infarcts largest in the temporoparietal region.  Minimally increased size of 4 mm right frontal operculum metastasis.  Essentially unchanged size of other lesions with decreased vasogenic edema in the temporal lobes.  Most recent echocardiogram with  ejection fraction of 60-65%, no wall motion abnormalities.  Admission chemistries unremarkable except sodium 134, potassium 3.2,  glucose 213, WBC 24,600 hemoglobin 8.7, blood cultures no growth to date.  Neurology follow-up currently maintained on Decadron protocol Lovenox transition at this time to intravenous heparin.  Wound VAC remains in place after recent redo thrombectomy of right iliac artery at [facility] and currently followed by vascular surgery Dr. Oneida Alar as patient has now formed thrombus in his left iliac and femoral system despite being on full anticoagulation and underwent left iliac femoral thrombectomy with left common iliac stent on 07/25/2020 per Dr. Oneida Alar...  Palliative care continues to follow to establish goals of care.  Therapy evaluations completed due to patient decreasing in mobility, self-care recommendations for physical medicine rehab consult.  Review of Systems  Constitutional: Positive for malaise/fatigue. Negative for chills and fever.  HENT: Negative for hearing loss.   Eyes: Negative for blurred vision and double vision.  Cardiovascular: Positive for leg swelling.  Gastrointestinal: Positive for constipation. Negative for heartburn, nausea and vomiting.  Genitourinary: Negative for dysuria, flank pain and hematuria.  Musculoskeletal: Positive for joint pain and myalgias.  Skin: Negative for rash.  Neurological: Positive for speech change, focal weakness and weakness.  Psychiatric/Behavioral: The patient has insomnia.   All other systems reviewed and are negative.  Past Medical History:  Diagnosis Date  . Atrial fibrillation ([place]) 02/29/2020  . Depressed left ventricular ejection fraction 02/29/2020  . Malignant melanoma of other part of trunk ([place]) 02/20/2020  . Malignant melanoma of skin of trunk, except scrotum [facility])    Past Surgical History:  Procedure Laterality Date  . AORTOGRAM  07/25/2020   Procedure: AORTOGRAM with Angiogram;  Surgeon: Elam Dutch, MD;  Location: [facility] OR;  Service: Vascular;;  . EMBOLECTOMY Left 07/25/2020   Procedure: LEFT ILIOFEMORAL EMBOLECTOMY;  Surgeon: Elam Dutch, MD;  Location: [place];  Service: Vascular;  Laterality: Left;  . INSERTION OF ILIAC STENT Left 07/25/2020   Procedure: INSERTION OF COMMON ILIAC STENT;  Surgeon: Elam Dutch, MD;  Location: [place];  Service: Vascular;  Laterality: Left;  . MOLE REMOVAL    . SALIVARY GLAND SURGERY    . THROMBECTOMY ILIAC ARTERY Left 07/25/2020   Procedure: THROMBECTOMY ILIAC ARTERY;  Surgeon: Elam Dutch, MD;  Location: [facility] OR;  Service: Vascular;  Laterality: Left;   Family History  Problem Relation Age of Onset  . Hypertension Mother   . Hypertension Father    Social History:  reports that he has quit smoking. He has never used smokeless tobacco. No history on file for alcohol use and drug use. Allergies: No Known Allergies Medications Prior to Admission  Medication Sig Dispense Refill  . acetaminophen (TYLENOL) 500 MG tablet Take 1,000 mg by mouth every 6 (six) hours as needed for mild pain or moderate pain.    Marland Kitchen amiodarone (PACERONE) 200 MG tablet Take 200 mg by mouth daily.    Marland Kitchen aspirin 81 MG chewable tablet Chew 81 mg by mouth daily.    . benzonatate (TESSALON) 100 MG capsule Take 1 capsule by mouth three times daily as needed for cough 90 capsule 0  . cyclobenzaprine (FLEXERIL) 10 MG tablet Take 1 tablet (10 mg total) by mouth 3 (three) times daily as needed for muscle spasms. 30 tablet 5  . dexamethasone (DECADRON) 2 MG tablet Take by mouth See admin instructions. 4mg  x 4 days, 2 mg x 7 days, 1mg  x 7 days    . digoxin (LANOXIN) 0.125 MG tablet Take 0.0625 mg by mouth daily.    Marland Kitchen diltiazem (TIAZAC) 120 MG 24 hr capsule Take 120 mg by mouth daily.    Marland Kitchen enoxaparin (LOVENOX) 60 MG/0.6ML injection Inject 60 mg into the skin in the morning and at bedtime.    . gabapentin (NEURONTIN) 100 MG capsule Take 100 mg by mouth 3 (three) times daily.     Marland Kitchen lidocaine (LIDODERM) 5 % Place 1 patch onto the skin See admin instructions. Apply to the most painful area for up to 12 hours in a 24 hour period    . melatonin 3 MG TABS tablet Take 3 mg by mouth at bedtime.    . metoprolol tartrate (LOPRESSOR) 50 MG tablet Take 1 tablet (50 mg total) by mouth 2 (two) times daily. 60 tablet 5  . ondansetron (ZOFRAN ODT) 4 MG disintegrating tablet Take 1 tablet (4 mg total) by mouth every 4 (four) hours as needed for nausea or vomiting. 30 tablet 5  . Oxycodone HCl 10 MG TABS Take 10 mg by mouth every 6 (six) hours as needed (pain).    . prochlorperazine (COMPAZINE) 10 MG tablet Take 10 mg by mouth every 6 (six) hours as needed for nausea or vomiting.    Donavan Burnet S 8.6-50 MG tablet Take 2 tablets by mouth 2 (two) times daily.    Marland Kitchen ENTRESTO 24-26 MG Take 1 tablet by mouth 2 (two) times daily. (Patient not taking: No sig reported)    . furosemide (LASIX) 20 MG tablet Take 1 tablet (20 mg total) by mouth daily. (Patient not taking: Reported on 07/22/2020) 90 tablet 3    Home: Home Living Family/patient expects to  be discharged to:: Private residence Living Arrangements: Spouse/significant other Available Help at Discharge: Family,Available 24 hours/day Type of Home: House Home Access: Stairs to enter [employer] of Steps: 2 STE cannot reach both at the same time [place]: One [place]an[place] commode,Shower seat,Walker - 4 wheels,Walker - 2 wheels  Functional History: Prior Function Level of Independence: Needs assistance Gait / Transfers Assistance Needed: Independent prior to 2/3. ADL's / Homemaking Assistance Needed: Independent prior to admission at [place] 3 weeks ago. Comments: still needed physical assistance after surgeries at [place] 2 weeks ago; wants to maintain independence as long as possible Functional Status:  Mobility: Bed Mobility Overal bed mobility: Needs Assistance Bed Mobility: Supine to Sit,Sit  to Supine Supine to sit: Min assist Sit to supine: Min guard General bed mobility comments: Patient able to advance BLE from bed surface to EOB but requires assist to elevate trunk. Patient also able to lower trunk to bed sufrace and advance BLE from EOB to bed level with return to supine. No external assist needed. Transfers Overall transfer level: Needs assistance Equipment used: Rolling walker (2 wheeled),1 person hand held assist Transfers: Sit to/from Stand Sit to Stand: Min assist General transfer comment: Light Min A for sit to stand from EOB x2 trials with cues for hand placement and technique. Ambulation/Gait General Gait Details: deferred- safety, very high ED stretcher    ADL: ADL Overall ADL's : Needs assistance/impaired Upper Body Dressing : Minimal assistance Upper Body Dressing Details (indicate cue type and reason): Donned posterior hospital gown. Lower Body Dressing: Minimal assistance,Sit to/from stand Lower Body Dressing Details (indicate cue type and reason): Min A to doff/don footwear seated EOB. Toilet Transfer: Minimal Technical brewer Details (indicate cue type and reason): Simluated with use of RW and hand held assist to maintain position of LUE on RW. Toileting- Clothing Manipulation and Hygiene: Minimal assistance,Sit to/from stand Toileting - Clothing Manipulation Details (indicate cue type and reason): Patient able to stand with use of RW and manage urinal to void bladder with Min A and hand held assist to maintain position of LUE on RW. General ADL Comments: Patient limited by L-hemiparesis greater in LUE than in LLE, decreased balance, decreased sensation and decreased coordination.  Cognition: Cognition Overall Cognitive Status: Within Functional Limits for tasks assessed Orientation Level: Oriented X4 Cognition Arousal/Alertness: Awake/alert Behavior During Therapy: Flat affect Overall Cognitive Status: Within Functional Limits for tasks  assessed General Comments: Answered questions and followed cues appropriately, A&Ox4  Blood pressure 102/68, pulse 87, temperature 98.7 F (37.1 C), temperature source Oral, resp. rate 16, height 5' 2.99" (1.6 m), weight 73.7 kg, SpO2 98 %. Physical Exam Vitals reviewed.  Constitutional:      General: He is not in acute distress. HENT:     Head: Normocephalic and atraumatic.     Right Ear: External ear normal.     Left Ear: External ear normal.     Nose: Nose normal.  Eyes:     General:        Right eye: No discharge.        Left eye: No discharge.     Extraocular Movements: Extraocular movements intact.  Cardiovascular:     Comments: Irregularly irregular Pulmonary:     Effort: Pulmonary effort is normal. No respiratory distress.     Breath sounds: No stridor.  Abdominal:     General: Abdomen is flat. There is distension.     Comments: Bowel sounds hypoactive  Musculoskeletal:  Cervical back: Normal range of motion and neck supple.     Comments: Right lower extremity with edema and tenderness  Skin:    Comments: Wound VAC right lower extremity fasciotomy site  Neurological:     Mental Status: He is alert.     Comments: Alert Makes eye contact with examiner follows commands.  Motor: Right upper extremity: 5/5 proximal distal Right lower extremity: 4/5 proximal distal some pain inhibition Left upper extremity: 3 -/5 proximal distant and full extremity: 4+/5 proximal distal  Psychiatric:        Mood and Affect: Mood normal.        Behavior: Behavior normal.        Thought Content: Thought content normal.     Results for orders placed or performed during the hospital encounter of 07/22/20 (from the past 24 hour(s))  Basic metabolic panel     Status: Abnormal   Collection Time: 07/25/20  4:17 PM  Result Value Ref Range   Sodium 134 (L) 135 - 145 mmol/L   Potassium 4.4 3.5 - 5.1 mmol/L   Chloride 102 98 - 111 mmol/L   CO2 21 (L) 22 - 32 mmol/L   Glucose, Bld 113  (H) 70 - 99 mg/dL   BUN 17 6 - 20 mg/dL   Creatinine, Ser 0.77 0.61 - 1.24 mg/dL   Calcium 7.8 (L) 8.9 - 10.3 mg/dL   GFR, Estimated >60 >60 mL/min   Anion gap 11 5 - 15  CBC     Status: Abnormal   Collection Time: 07/25/20  4:17 PM  Result Value Ref Range   WBC 18.6 (H) 4.0 - 10.5 K/uL   RBC 3.19 (L) 4.22 - 5.81 MIL/uL   Hemoglobin 9.4 (L) 13.0 - 17.0 g/dL   HCT 28.9 (L) 39.0 - 52.0 %   MCV 90.6 80.0 - 100.0 fL   MCH 29.5 26.0 - 34.0 pg   MCHC 32.5 30.0 - 36.0 g/dL   RDW 14.2 11.5 - 15.5 %   Platelets 202 150 - 400 K/uL   nRBC 0.0 0.0 - 0.2 %  Heparin level (unfractionated)     Status: None   Collection Time: 07/25/20  8:08 PM  Result Value Ref Range   Heparin Unfractionated 0.60 0.30 - 0.70 IU/mL  Glucose, capillary     Status: Abnormal   Collection Time: 07/25/20  9:37 PM  Result Value Ref Range   Glucose-Capillary 208 (H) 70 - 99 mg/dL   Comment 1 Notify RN    Comment 2 Document in Chart   Basic metabolic panel     Status: Abnormal   Collection Time: 07/26/20  3:09 AM  Result Value Ref Range   Sodium 131 (L) 135 - 145 mmol/L   Potassium 4.0 3.5 - 5.1 mmol/L   Chloride 101 98 - 111 mmol/L   CO2 22 22 - 32 mmol/L   Glucose, Bld 129 (H) 70 - 99 mg/dL   BUN 14 6 - 20 mg/dL   Creatinine, Ser 0.79 0.61 - 1.24 mg/dL   Calcium 7.6 (L) 8.9 - 10.3 mg/dL   GFR, Estimated >60 >60 mL/min   Anion gap 8 5 - 15  CBC     Status: Abnormal   Collection Time: 07/26/20  3:09 AM  Result Value Ref Range   WBC 17.6 (H) 4.0 - 10.5 K/uL   RBC 2.93 (L) 4.22 - 5.81 MIL/uL   Hemoglobin 8.9 (L) 13.0 - 17.0 g/dL   HCT 25.8 (L) 39.0 - 52.0 %  MCV 88.1 80.0 - 100.0 fL   MCH 30.4 26.0 - 34.0 pg   MCHC 34.5 30.0 - 36.0 g/dL   RDW 14.5 11.5 - 15.5 %   Platelets 197 150 - 400 K/uL   nRBC 0.0 0.0 - 0.2 %  Heparin level (unfractionated)     Status: Abnormal   Collection Time: 07/26/20  3:09 AM  Result Value Ref Range   Heparin Unfractionated 0.16 (L) 0.30 - 0.70 IU/mL  Glucose, capillary      Status: Abnormal   Collection Time: 07/26/20  6:31 AM  Result Value Ref Range   Glucose-Capillary 242 (H) 70 - 99 mg/dL   Comment 1 Notify RN    Comment 2 Document in Chart   Glucose, capillary     Status: Abnormal   Collection Time: 07/26/20 10:56 AM  Result Value Ref Range   Glucose-Capillary 142 (H) 70 - 99 mg/dL  Heparin level (unfractionated)     Status: Abnormal   Collection Time: 07/26/20 11:49 AM  Result Value Ref Range   Heparin Unfractionated 0.16 (L) 0.30 - 0.70 IU/mL  CBC     Status: Abnormal   Collection Time: 07/26/20  1:26 PM  Result Value Ref Range   WBC 18.4 (H) 4.0 - 10.5 K/uL   RBC 3.11 (L) 4.22 - 5.81 MIL/uL   Hemoglobin 9.4 (L) 13.0 - 17.0 g/dL   HCT 27.3 (L) 39.0 - 52.0 %   MCV 87.8 80.0 - 100.0 fL   MCH 30.2 26.0 - 34.0 pg   MCHC 34.4 30.0 - 36.0 g/dL   RDW 14.6 11.5 - 15.5 %   Platelets 218 150 - 400 K/uL   nRBC 0.0 0.0 - 0.2 %   CT ANGIO AO+BIFEM W & OR WO CONTRAST  Result Date: 07/25/2020 CLINICAL DATA:  60 year old male with arterial embolism into lower leg. EXAM: CT ANGIOGRAPHY OF ABDOMINAL AORTA WITH ILIOFEMORAL RUNOFF TECHNIQUE: Multidetector CT imaging of the abdomen, pelvis and lower extremities was performed using the standard protocol during bolus administration of intravenous contrast. Multiplanar CT image reconstructions and MIPs were obtained to evaluate the vascular anatomy. CONTRAST:  24mL OMNIPAQUE IOHEXOL 350 MG/ML SOLN COMPARISON:  07/04/2020, 07/22/2020 FINDINGS: VASCULAR Aorta: The aorta is normal in caliber and patent throughout. There is increased burden of wall adherent mural thrombus in the infrarenal aorta resulting in approximately 40% stenosis the distal aorta. Celiac: Mild ostial stenosis likely secondary to median arcuate ligament compression with mild poststenotic dilation. Otherwise widely patent. SMA: Patent without evidence of aneurysm, dissection, vasculitis or significant stenosis. Renals: Single bilateral renal arteries are  patent without evidence of aneurysm, dissection, vasculitis, fibromuscular dysplasia or significant stenosis. IMA: Patent without evidence of aneurysm, dissection, vasculitis or significant stenosis. RIGHT Lower Extremity Inflow: Right common iliac stent in place which is widely patent, extending from the aortic bifurcation to the proximal external iliac artery. The internal iliac arteries covered, therefore occluded. The remaining external iliac and common femoral artery patent. Outflow: Common, superficial and profunda femoral arteries and the popliteal artery are patent without evidence of aneurysm, dissection, vasculitis or significant stenosis. Runoff: Patent trifurcation. Abrupt occlusion of the proximal peroneal artery. Abrupt occlusion of the distal anterior tibial artery. Diminutive appearance of the posterior tibial artery at the level of the ankle. LEFT Lower Extremity Inflow: Occlusion of the indwelling left common iliac stent. Patent internal and external iliac arteries. Outflow: Occlusion of the left distal common femoral artery extending into the proximal profunda and superficial femoral artery. Otherwise widely patent  superficial femoral artery and popliteal artery. Runoff: Patent trifurcation. The peroneal artery is diminutive distally. Inline flow to the foot via the anterior tibial posterior tibial arteries. Veins: No obvious venous abnormality within the limitations of this arterial phase study. Review of the MIP images confirms the above findings. NON-VASCULAR Lower chest: Lingular mosaic attenuation. Left basilar subsegmental atelectasis. Scattered nodular opacities in the lingula, the largest measuring up to 1.1 cm. Multifocal scattered punctate solid and ground-glass nodules throughout the visualized right lung base. Hepatobiliary: No focal liver abnormality is seen. No gallstones, gallbladder wall thickening, or biliary dilatation. Pancreas: Unremarkable. No pancreatic ductal dilatation or  surrounding inflammatory changes. Spleen: Normal in size without focal abnormality. Adrenals/Urinary Tract: Adrenal glands are unremarkable. Soft tissue density exophytic lesion arising from the medial aspect of the right kidney superior pole, better visualized the comparison study. Kidneys are otherwise normal, without renal calculi, focal lesion, or hydronephrosis. Bladder is unremarkable. Stomach/Bowel: Stomach is within normal limits. Appendix appears normal. No evidence of bowel wall thickening, distention, or inflammatory changes. Lymphatic: No abdominopelvic lymphadenopathy. Reproductive: Prostate is unremarkable. Other: Expected postsurgical changes after right common femoral artery cutdown with minimal fluid about the subcutaneous incision. Musculoskeletal: Significant interval progression of previously visualized soft tissue nodularity and multilobular mass like formation, incompletely visualized, in the left axilla with associated worsening left lateral abdominal in thoracic anasarca extending into the left thigh. No acute osseous abnormality or aggressive appearing osseous lesions. Postsurgical changes after right lower leg medial and lateral compartment fasciotomies without complicating features. IMPRESSION: VASCULAR 1. Acute occlusion of the indwelling left common iliac stent. 2. Increased burden of mural thrombus in the distal abdominal aorta resulting in up to approximately 40% stenosis just proximal to the indwelling kissing iliac stents. 3. Acute occlusion of the distal left common femoral artery extending into the proximal profunda and superficial femoral artery. 4. Embolic occlusion of the left proximal peroneal and distal anterior tibial arteries. NON-VASCULAR 1. Significant interval progression in size of partially visualized left axillary lobular soft tissue mass with associated left trunk anasarca. 2. Scattered subcentimeter solid and ground-glass nodules in the visualized lung bases, largest  in the lingula, concerning for metastatic disease. 3. Postsurgical changes after right femoral cutdown and right lower extremity mediolateral compartment fasciotomies without complicating features. 4. Indeterminate soft tissue density 1 cm exophytic right superior pole mass. Hemorrhagic cyst is most likely, however, metastasis or synchronous primary neoplasm could appear similarly. These results were called by telephone at the time of interpretation on 07/25/2020 at 9:22 am to provider Dr. Matilde Haymaker, Who verbally acknowledged these results. Ruthann Cancer, MD [facility] Radiology Electronically Signed   By: Ruthann Cancer MD   On: 07/25/2020 09:30   DG Ang/Ext/Uni/Or Left  Result Date: 07/26/2020 CLINICAL DATA:  LEFT ILIOFEMORAL EMBOLECTOMYTHROMBECTOMY ILIAC ARTERYINSERTION OF COMMON ILIAC STENTAORTOGRAM with Angiogram EXAM: LEFT ANG/EXT/UNI/ OR CONTRAST:  Not specified FLUOROSCOPY TIME:  Fluoroscopy Time:  7 minutes 13 seconds Radiation Exposure Index (if provided by the fluoroscopic device): 64 mGy Number of Acquired Spot Images: 0 COMPARISON:  CT angiography abdominal aorta with runoff 07/24/2020 FINDINGS: Submitted images demonstrate bilateral common iliac artery stents, with complete to near complete occlusion of the left common iliac artery stent. Right common and external iliac arteries are patent. Balloon plasty of the left common iliac artery stent is seen. Final angiogram demonstrates patency of the left common iliac artery with continued mild narrowing at the origin. Please refer to dictation of the procedure for complete  report. IMPRESSION: Distal aortic and bilateral iliac angiogram as above. Electronically Signed   By: Miachel Roux M.D.   On: 07/26/2020 08:08    Assessment/Plan: Diagnosis: multiple acute right MCA  Stroke: Continue secondary stroke prophylaxis and Risk Factor Modification listed below:   Antiplatelet therapy:   Blood Pressure  Management:  Continue current medication with prn's with permisive HTN per primary team Statin Agent:   Diabetes management:   Left sided hemiparesis: fit for orthosis to prevent contractures (resting hand splint for day, wrist cock up splint at night, PRAFO, etc) PT/OT for mobility, ADL training  Motor recovery: Fluoxetine Labs independently reviewed.  Records reviewed and summated above.  1. Does the need for close, 24 hr/day medical supervision in concert with the patient's rehab needs make it unreasonable for this patient to be served in a less intensive setting? Yes  2. 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 Due to safety, disease management and patient education, does the patient require 24 hr/day rehab nursing? Yes 4. Does the patient require coordinated care of a physician, rehab nurse, therapy disciplines of PT/OT to address physical and functional deficits in the context of the above medical diagnosis(es)? Yes Addressing deficits in the following areas: balance, endurance, locomotion, strength, transferring, bathing, dressing, toileting and psychosocial support 5. Can the patient actively participate in an intensive therapy program of at least 3 hrs of therapy per day at least 5 days per week? Yes 6. The potential for patient to make measurable gains while on inpatient rehab is excellent 7. Anticipated functional outcomes  upon discharge from inpatient rehab are supervision and min assist  with PT, supervision and min assist with OT, n/a with SLP. 8. Estimated rehab length of stay to reach the above functional goals is: 9-13 days. 9. Anticipated discharge destination: Home 10. Overall Rehab/Functional Prognosis: good and fair  RECOMMENDATIONS: This patient's condition is appropriate for continued rehabilitative care in the following setting: CIR Patient has agreed to participate in recommended program. Yes Note that insurance prior authorization may be required for reimbursement for recommended care.  Comment: Rehab Admissions Coordinator to follow up.  I have personally performed a face to face diagnostic evaluation, including, but not limited to relevant history and physical exam findings, of this patient and developed relevant assessment and plan.  Additionally, I have reviewed and concur with the physician assistant's documentation above.   Delice Lesch, MD, ABPMRDaniel Sloan Leiter, PA-C 07/26/2020

## 2020-07-25 NOTE — Progress Notes (Signed)
Pt w/ 2.02 sec SVR at 0204 per telemetry. Pt AOx4 upon assessment w/o complaint. Paige, DO (RFM) informed.

## 2020-07-25 NOTE — Progress Notes (Signed)
Family Medicine Teaching Service Daily Progress Note Intern Pager: [phone]  Patient name: Adam Bender. Medical record number: [id number] Date of birth: 1961-04-28 Age: 60 y.o. Gender: male  Primary Care Provider: Patient, No Pcp Per Consultants: Neurology, hematology-oncology, vascular surgery   Code Status: FULL  Pt Overview and Major Events to Date:  Admitted 2/19: CVA work-up 2/21: Acute worsening of right foot pain, CTA showing left lower extremity clots 2/22: Left embolectomy with fasciotomy  Assessment and Plan: Adam Bender. is a 60 y.o. male presenting with slurred speech, left-sided facial droop, left-sided weakness found to have CVA and progression of metastatic disease  PMH is significant for metastatic malignant melanoma, brain metastases, atrial fibrillation with recent venous and arterial clot s/p right lower extremity thrombectomy and fasciotomy on 07/04/2020 and repeat thrombectomy on 07/07/2020.  History of Recurrent Right Iliac Artery Thrombus, Now With Left Iliac Artery Thrombus  Evening of 2/21 patient began to have worsening right leg pain not relieved with pain medication. Concern for acute limb ischemia, CTA lower extremities showed left iliac artery thrombus with additional significant clot burden.  Switched from heparin drip to lovenox 80mg  subcut yesterday, but restarted heparin drip at 1400u/hour. Today, pain is improved, and patient is awaiting surgery. -Monitor pain  -Goals of care discussion with patient and wife today, as systemic embolic disease is likely even with anticoagulation  -Vascular surgery consulted: left thrombectomy with fasciotomies today -Heparin drip, goal level 0.3-0.5, rate per pharmacy  Left-Sided Weakness, 2/2 Acute CVA Speech improved, weakness present. MRI showed multiple acute right MCA territory infarcts, largest in tempoparietal region with minimally increased frontal operculum metastasis and unchanged size of other  lesions. -Frequent neuro checks -Heparin gtt, rate per pharmacy -Neurology recs: stroke etiology most likely due to hypercoaguable state vs chronic atrial fibrillation v paradoxical emboli, anticoagulate  -PT recs CIR, but goals of care with rehab vs home important to discuss   Metastatic Melanoma Follows with Dr. Hinton Rao. Metastases to brain, pulmonary nodules, left axillary nodules, on steroid taper, see below. Radiation to left axilla started 2/1. Pain management as below. -Decadron taper: 2 mg x7 days (2/21-2/27), 1 mg x7days (2/28-3/6) -Pain control: oxycodone 10mg  q6h prn, tylenol 1000mg  q6 PRN, gabapentin 100mg  TID, flexeril 10mg  q8 PRN, lidoderm patch, dilaudid 0.5 IV q4 PRN  -Palliative consult: continue full scope treatment, full code, patient and wife are open to continuing cancer treatment and symptom relief  -Patient and family would like for hematology-oncology to weigh in about prognosis and ongoing therapies   Goals of care Adam Bender and his wife are aware that he has a very poor prognosis.  His ongoing blood clot disorders are related to his known metastatic melanoma.  He has been having talks with palliative care with his wife.  We appreciate the ongoing support and help with palliative care.  At this point, many of his medical problems are not reversible.  Vascular surgery and oncology have both highlighted the importance of ongoing goals of care discussions. -Palliative care following, appreciate recommendations and continued goals of care conversations.  Atrial Fibrillation Follows with Dr. Roetta Sessions. Admission ECG showed atrial fibrillation with RVR, rate 115-130. Wife reports this is his baseline. Irregular rhythm on exam but denies symptoms. Due to hypercoaguable state 2/2 cancer diagnosis, anticoagulation has been an ongoing challenge and patient continues to have systemic clotting. -Continue home rate and rhythm control: digoxin 0.0625mg  qd, diltiazem 180mg  qd,  amiodarone 200mg  qd, metoprolol 50mg  BID -Anticoagulation: currently on heparin drip,  will plan to continue heparin drip for now.  Outpatient anticoagulation will be challenging.  It may be appropriate to transition to Lovenox.  We will discuss this with pharmacy.  Leukocytosis, improving Admission WBC 24.6>19.4. Likely in the setting of known metastatic melanoma and current steroid use. Could also consider infectious etiology, but unlikely as blood cultures no growth, CXR negative. -Daily CBC  Normocytic Anemia, stable Admission Hgb 8.7>8.2. Likely in the setting of recent vascular procedure and known metastatic cancer. Transfusion threshold 8. -Daily CBC  Hypokalemia, resolved Admission K 3.2>4.2. Mg 2.2. -Daily BMP  HFrEF, Previous EF 50% in 2021 Echo 06/17/20, EF 42-59%, diastolic parameters indeterminate. Home meds: metoprolol as above, no longer on jardiance 10mg , Lasix 20mg  QD, entresto 49/51 BID since [facility] admission. Unclear why these medications were discontinued, but will refer to cardiology for further med reconciliation.   Elevated A1c at 6.4% -Sensitive SSI while admitted, no outpatient medication  FEN/GI: NPO for surgery, heart healthy  PPx: Heparin drip  Disposition: Admit to inpatient, FPTS, attending Dr. Owens Shark  Subjective:  Patient seen today in bed. Resting comfortably awaiting surgery later this morning. Wife at bedside. Both patient and wife were tearful today when discussing patient's recurrent clotting. I believe they are aware prognosis is poor but they would like to do as much as they can for treatment at this point. Patient denies pain, shortness of breath, nausea, chest pain.   Objective: Temp:  [98.1 F (36.7 C)-99.7 F (37.6 C)] 98.1 F (36.7 C) (02/22 0714) Pulse Rate:  [52-98] 85 (02/22 0714) Resp:  [15-20] 18 (02/22 0714) BP: (85-147)/(48-85) 94/64 (02/22 0714) SpO2:  [96 %-100 %] 98 % (02/22 0714)  Physical Exam: General: Resting in bed  comfortably.  No acute distress. Cardiovascular: Irregular rhythm, no murmurs, rubs, gallops.Bilateral posterior tibial and dorsalis pedis weak.  Respiratory: Breathing comfortably on room air.  No respiratory distress. Abdomen: Soft, nontender to palpation Lower extremities: Right foot maintains its dusky appearance without palpable pulses.  Pain significantly improved compared to prior exam. Motor: right upper and lower extremity 5/5 strength. Left upper extremity: unable to abduct greater than 15 degrees, little effort with hand grip and finger movement. Left lower extremity: equal to right LE with plantar flexion, 1/5 strength dorsiflexion - however, I believe this was due to poor effort and less neurological  Psych: tearful and flat affect, mood depressed but appropriate  Laboratory: Recent Labs  Lab 07/24/20 0241 07/24/20 1810 07/25/20 0347  WBC 22.4* 20.3* 19.4*  HGB 8.6* 8.4* 8.2*  HCT 27.1* 26.4* 25.4*  PLT 254 256 241   Recent Labs  Lab 07/22/20 2200 07/22/20 2212 07/24/20 1554 07/24/20 1810 07/25/20 0347  NA 134*   < > 132* 131* 133*  K 3.2*   < > 4.3 4.3 4.2  CL 100   < > 100 98 98  CO2 22   < > 22 23 24   BUN 13   < > 16 16 18   CREATININE 0.72   < > 0.80 0.82 0.83  CALCIUM 8.0*   < > 8.1* 8.0* 8.3*  PROT 5.5*  --   --   --   --   BILITOT 0.4  --   --   --   --   ALKPHOS 294*  --   --   --   --   ALT 28  --   --   --   --   AST 27  --   --   --   --  GLUCOSE 213*   < > 158* 147* 115*   < > = values in this interval not displayed.    Imaging/Diagnostic Tests: CT ANGIO AO+BIFEM W OR WO CONTRAST 07/24/2020 IMPRESSION: 1. Acute occlusion of the indwelling left common iliac stent. 2. Increased burden of mural thrombus in the distal abdominal aorta resulting in up to approximately 40% stenosis just proximal to the indwelling kissing iliac stents. 3. Acute occlusion of the distal left common femoral artery extending into the proximal profunda and superficial femoral  artery. 4. Embolic occlusion of the left proximal peroneal and distal anterior tibial arteries.  CT Code Stroke CTA Head/Neck W/WO contrast 07/22/2020 IMPRESSION:  1. Occlusion of a distal right M3 branch in the inferior right parietal region. 9 mL core infarction with slight loss of gray-white differentiation at the inferior right parietal region. 19 CC region of at risk brain with a mismatch volume of 19 mL.  2. No carotid bifurcation disease. No visible aortic atherosclerosis.  3. Hilar and mediastinal lymphadenopathy consistent with the diagnosis of metastatic disease.  MR BRAIN W WO CONTRAST 07/23/2020 IMPRESSION:  1. Multiple acute right MCA territory infarcts, largest in the temporoparietal region.  2. Minimally increased size of 4 mm right frontal operculum metastasis.  3. Essentially unchanged size of other lesions with decreased vasogenic edema in the temporal lobes.  4. Unchanged left cerebral leptomeningeal enhancement.   DG Chest Port 1 View 07/22/2020 IMPRESSION: No definite acute cardiopulmonary process.   CT HEAD CODE STROKE WO CONTRAST 07/22/2020 IMPRESSION:  1. Known bilateral temporal lobe metastatic disease. Subtle architectural distortion of the inferior temporal lobe on the right. Some volume loss and distortion of the inferior and anterior left temporal lobe. Other tiny metastases previously seen by MRI are not specifically appreciable. No acute finding by CT. 2. ASPECTS is 10.    Karle Plumber, Medical Student 07/25/2020, 7:38 AM Acting Intern, [place] Intern pager: [phone], text pages welcome  FPTS Upper-Level Resident Addendum   I have independently interviewed and examined the patient. I have discussed the above with the original author and agree with their documentation. My edits for correction/addition/clarification are in blue. Please see also any attending notes.    Matilde Haymaker MD PGY-3, [facility] 07/25/2020  12:04 PM  [place] Service pager: [phone] (text pages welcome through [facility])

## 2020-07-25 NOTE — Progress Notes (Signed)
S: Went to evaluate patient at the start of shift.  Found patient resting comfortably with his spouse and another family member in the room with him.  Patient denied complaints at this time and states his legs feel better than they did yesterday.  Patient states he will let us know if he develops any significant pain but at this time he is comfortable.  O: BP 95/60 (BP Location: Right Arm)   Pulse 80   Temp 98.5 F (36.9 C) (Oral)   Resp 18   Ht 5' 2.99" (1.6 m)   Wt 73.7 kg   SpO2 98%   BMI 28.79 kg/m  General: Alert and oriented in no apparent distress Heart: Regular rate and rhythm with no murmurs appreciated Lungs: CTA bilaterally, no wheezing Skin: Warm and dry Extremities: Left lower extremity with palpable pulses, which feel improved from yesterday.  Extremity is warm and appears well perfused.  Right lower extremity with faintly palpable pulses similar to yesterday's exam.   A/P: 60 year old male status post thrombectomy of his left lower extremity due to clot burden.  Patient doing well at this time with no significant pain.  I discussed in depth with patient to let us know if he gets any significant pain as we will have low threshold for consideration of additional thrombus versus compartment syndrome, though at this time the patient has no complaints.  Pulses appear to be improved in the left leg versus yesterday and similar in the right and both extremities appear well-perfused at this time. Plan: -Patient's spouse, [name], requests to be called anytime tonight if the patient has a change in status -If the patient develops any worsening pain tonight we will assess for concern of additional thrombus versus compartment syndrome versus any other cause of his pain and will have low threshold to reach out to IR versus surgery. -We will continue to monitor

## 2020-07-25 NOTE — Progress Notes (Signed)
Patient really with no left leg symptoms this morning.  History is difficult to obtain but he thinks he may have a little bit of numbness and tingling in the left foot.  He complains more of the right foot.  He has some pain around the right lateral ankle and heel.  He has some intermittent muscle spasms in the right leg.  I do not believe that he has complete insight into his overall condition.  His understanding of his melanoma is that we will currently treat symptoms as they appear.  I am not sure that he understands that this is not a curable process.  His wife is supposed to come in later this morning to have further discussions with him.  He does understand that "clots keep moving around".  Physical exam:  Vitals:   07/24/20 2359 07/25/20 0353 07/25/20 0423 07/25/20 0714  BP: 109/63 (!) 91/52 (!) 101/55 94/64  Pulse: 63 75  85  Resp: 17 17  18   Temp: 98.2 F (36.8 C) 98.7 F (37.1 C)  98.1 F (36.7 C)  TempSrc: Oral Oral  Oral  SpO2: 96% 99%  98%  Weight:      Height:        Extremities: Left foot is slightly more pale and cool than the right there is some capillary refill right foot is pink with brisk capillary refill, there is some ecchymosis on the lateral malleolus.  This does not appear to be ischemic skin.  Neuro: He still has some clumsiness and weakness of the left hand.  He is able to move the left foot normally.  He is also able to move the right foot normally.  Motor function of both lower extremities is 5/5.  Assessment: Patient with metastatic melanoma now with recurrent emboli.  Previous embolus to the right leg and brain within the last 2 weeks.  New embolus to left iliac and common femoral artery.  I will call the wife this morning to see if she wishes to proceed with operation.  If the plan is for comfort care only then I would not consider an intervention at this point.  Plan: Keep patient n.p.o. for now  Continue heparin IV as I would consider this a Lovenox  failure  Left leg embolectomy possible fasciotomies later this morning if wife wishes to proceed.  Ruta Hinds, MD [facility] Office: [phone]

## 2020-07-25 NOTE — Op Note (Signed)
Procedure: Left iliac and femoral thrombectomy, abdominal aortogram and pelvic angiogram, left common iliac stent (VBX 8 x 60 postdilated with a 10 balloon)  Preoperative diagnosis: Acute ischemia left leg  Postoperative diagnosis: Same  Anesthesia: General  Assistant: Gae Gallop, MD, Arlee Muslim, PA-C to expedite procedure and manipulation of wires  Operative findings: 1.  Embolic versus thrombotic material left iliac femoral system  2.  Diffuse greater than 80% narrowing pre-existing left common iliac stent Reliant with a 8 x 60 VBX stent and postdilated with a 10x 40 balloon  3.  Patent right common iliac stent  Operative details: After pain form consent, the patient was taken the operating.  The patient was placed in supine position operating table.  After induction general esthesia endotracheal ovation patient's entire left lower extremities prepped and draped in usual sterile fashion.  Next a longitudinal incision was made in the left groin carried on through subcutaneous tissues down the level left common femoral artery.  There was a large amount of edematous fluid.  Left common femoral artery was exposed and there was no pulse within it.  There was a Pro-glide suture device at the distal common femoral artery and this was all taken down with Metzenbaum scissors.  Superficial femoral artery was dissected free circumferentially about 4 cm after its origin to make sure that we did not have further distal embolization.  Profunda femoris artery was dissected free circumferentially.  It was a relatively high common femoral bifurcation.  I had to take down a portion of the inguinal ligament and a fair amount of retraction was required to expose suitable segment of the common femoral artery so that we had working room to perform the embolectomy.  Several circumflex iliac branches were dissected free circumferentially and Vesseloops placed around these.  Patient was then given 12,000 units of  intravenous heparin.  He was given additional heparin during the course of the case to maintain an ACT greater than 250.  At this point a transverse arteriotomy was made in the common femoral artery just above the femoral bifurcation.  A transverse arteriotomy was made in the common femoral artery just above the femoral bifurcation.  #4 and 5 Fogarty catheters were passed proximally up in the left iliac system there was some resistance at about 20 cm.  This was consistent with the previously placed iliac stent.  I was able to create a channel with a 5 Fogarty catheter and establish some inflow but it was not brisk.  At this point #3 and 4 Fogarty catheters reviewed this thrombectomize the profunda and superficial femoral arteries.  Multiple passes were made until 2 clean passes were obtained.  There was brisk backbleeding from both of these.  The embolic material was fairly rubbery and thick on appearance and was sent to pathology as a specimen in light of the patient's known metastatic melanoma.  At this point the arteriotomy was reapproximated using a running 6-0 Prolene suture.  Everything was for blood backbled and thoroughly flushed.  Anastomosis was completed clamps were released and at this point the patient did have a posterior tibial Doppler signal but did not really have a good quality femoral pulse especially compared to the contralateral side.  At this point an introducer needle was placed into the common femoral artery just above my prior suture line and an 035 Bentson wire advanced into the left iliac system.  A 5 French sheath was then placed over the Bentson wire into the left iliac system.  A 5 [place] KMP catheter was then advanced over the guidewire and I use this to direct the wire across a pre-existing left common iliac stent.  Contrast angiogram was performed which showed 70 to 80% narrowing of the left common iliac stent.  At this point the 5 French sheath was swapped out over an 035 versa core  wire for a 7 [place] Brite tip sheath and this was advanced in the left iliac system.  Contrast angiogram was used for roadmapping purposes.  The patient did have a patent internal iliac artery bilaterally although these were fairly small.  I then marked the area of the internal iliac as well as the area of narrowing in the left common iliac stent.  Preoperative imaging had shown thrombus extending into the distal aorta and there was a scalloped edge along the proximal end of the left common iliac extending into the aorta which probably this represented.  I then placed an 8 x 60 VBX stent and deployed this to nominal pressure extending from the distal aorta into the left common iliac artery.  Completion angiogram showed that the right common iliac stent was still patent.  There was still a scallop in the aorta which I suspected was thrombus just proximal to the stent.  Therefore I brought up a Sadorus balloon and angioplastied this up to nominal pressure including the distal aorta.  Completion angiogram showed about a 25% narrowing of the right common iliac stent at this point but there was brisk flow through this.  The left common iliac artery stent was widely patent and there was a good quality pulse in the left femoral artery and a palpable posterior tibial pulse on the left side at this point.  Guidewires catheters and sheaths were removed and the arteriotomy repaired with a 5-0 Prolene.  The wound was thoroughly irrigated normal saline solution.  Hemostasis was obtained.  A 10 round Blake drain was placed in the left groin and sutured to the skin just below the incision.  The incision was then closed with multiple layers of running 2-0 and 3-0 Vicryl suture and 4-0 Vicryl subcuticular stitch in the skin.  Patient tolerated the procedure well and there were no complications.  Instrument sponge and needle count was correct at the end of the case.  Patient was taken to the recovery room in stable  condition.  The patient's vascular exam at the conclusion of the case was a posterior tibial Doppler signal in the right foot and a palpable posterior tibial pulse in the left foot.  Ruta Hinds, MD [facility] Office: [phone]

## 2020-07-26 ENCOUNTER — Encounter (HOSPITAL_COMMUNITY): Payer: Self-pay | Admitting: Vascular Surgery

## 2020-07-26 DIAGNOSIS — C439 Malignant melanoma of skin, unspecified: Secondary | ICD-10-CM

## 2020-07-26 DIAGNOSIS — I63511 Cerebral infarction due to unspecified occlusion or stenosis of right middle cerebral artery: Secondary | ICD-10-CM

## 2020-07-26 DIAGNOSIS — R29898 Other symptoms and signs involving the musculoskeletal system: Secondary | ICD-10-CM | POA: Diagnosis not present

## 2020-07-26 DIAGNOSIS — I745 Embolism and thrombosis of iliac artery: Secondary | ICD-10-CM | POA: Diagnosis not present

## 2020-07-26 DIAGNOSIS — C7931 Secondary malignant neoplasm of brain: Secondary | ICD-10-CM | POA: Diagnosis not present

## 2020-07-26 DIAGNOSIS — I5042 Chronic combined systolic (congestive) and diastolic (congestive) heart failure: Secondary | ICD-10-CM

## 2020-07-26 DIAGNOSIS — I4891 Unspecified atrial fibrillation: Secondary | ICD-10-CM | POA: Diagnosis not present

## 2020-07-26 DIAGNOSIS — I639 Cerebral infarction, unspecified: Secondary | ICD-10-CM | POA: Diagnosis not present

## 2020-07-26 LAB — CBC
HCT: 25.8 % — ABNORMAL LOW (ref 39.0–52.0)
HCT: 27.3 % — ABNORMAL LOW (ref 39.0–52.0)
Hemoglobin: 8.9 g/dL — ABNORMAL LOW (ref 13.0–17.0)
Hemoglobin: 9.4 g/dL — ABNORMAL LOW (ref 13.0–17.0)
MCH: 30.4 pg (ref 26.0–34.0)
MCH: 30.2 pg (ref 26.0–34.0)
MCHC: 34.5 g/dL (ref 30.0–36.0)
MCHC: 34.4 g/dL (ref 30.0–36.0)
MCV: 88.1 fL (ref 80.0–100.0)
MCV: 87.8 fL (ref 80.0–100.0)
Platelets: 197 10*3/uL (ref 150–400)
Platelets: 218 10*3/uL (ref 150–400)
RBC: 2.93 MIL/uL — ABNORMAL LOW (ref 4.22–5.81)
RBC: 3.11 MIL/uL — ABNORMAL LOW (ref 4.22–5.81)
RDW: 14.5 % (ref 11.5–15.5)
RDW: 14.6 % (ref 11.5–15.5)
WBC: 17.6 10*3/uL — ABNORMAL HIGH (ref 4.0–10.5)
WBC: 18.4 10*3/uL — ABNORMAL HIGH (ref 4.0–10.5)
nRBC: 0 % (ref 0.0–0.2)
nRBC: 0 % (ref 0.0–0.2)

## 2020-07-26 LAB — BASIC METABOLIC PANEL
Anion gap: 8 (ref 5–15)
BUN: 14 mg/dL (ref 6–20)
CO2: 22 mmol/L (ref 22–32)
Calcium: 7.6 mg/dL — ABNORMAL LOW (ref 8.9–10.3)
Chloride: 101 mmol/L (ref 98–111)
Creatinine, Ser: 0.79 mg/dL (ref 0.61–1.24)
GFR, Estimated: >60 mL/min (ref >60)
Glucose, Bld: 129 mg/dL — ABNORMAL HIGH (ref 70–99)
Potassium: 4 mmol/L (ref 3.5–5.1)
Sodium: 131 mmol/L — ABNORMAL LOW (ref 135–145)

## 2020-07-26 LAB — BPAM RBC
Blood Product Expiration Date: 202203122359
Blood Product Expiration Date: 202203122359
ISSUE DATE / TIME: 202202221308
ISSUE DATE / TIME: 202202221308
Unit Type and Rh: 6200
Unit Type and Rh: 6200

## 2020-07-26 LAB — GLUCOSE, CAPILLARY
Glucose-Capillary: 242 mg/dL — ABNORMAL HIGH (ref 70–99)
Glucose-Capillary: 142 mg/dL — ABNORMAL HIGH (ref 70–99)
Glucose-Capillary: 119 mg/dL — ABNORMAL HIGH (ref 70–99)
Glucose-Capillary: 156 mg/dL — ABNORMAL HIGH (ref 70–99)

## 2020-07-26 LAB — HEPARIN LEVEL (UNFRACTIONATED)
Heparin Unfractionated: 0.16 IU/mL — ABNORMAL LOW (ref 0.30–0.70)
Heparin Unfractionated: 0.16 IU/mL — ABNORMAL LOW (ref 0.30–0.70)
Heparin Unfractionated: 0.18 IU/mL — ABNORMAL LOW (ref 0.30–0.70)

## 2020-07-26 LAB — TYPE AND SCREEN
ABO/RH(D): A POS
Antibody Screen: NEGATIVE
Unit division: 0
Unit division: 0

## 2020-07-26 LAB — DIGOXIN LEVEL: Digoxin Level: 0.5 ng/mL — ABNORMAL LOW (ref 0.8–2.0)

## 2020-07-26 LAB — SURGICAL PATHOLOGY

## 2020-07-26 MED ORDER — ACETAMINOPHEN 500 MG PO TABS
1000.0000 mg | ORAL_TABLET | Freq: Four times a day (QID) | ORAL | Status: DC
  Administered 2020-07-26 – 2020-07-30 (×16): 1000 mg via ORAL
  Filled 2020-07-26 (×15): qty 2

## 2020-07-26 NOTE — Progress Notes (Signed)
Manufacturing engineer [facility])  Hospital Liaison: RN note       Notified by [facility] manager of patient/family request for [facility] Palliative services at home/rehab after discharge.              [place] Palliative team will follow up with patient after discharge.       Please call with any hospice or palliative related questions.       Thank you for this referral.       Farrel Gordon, RN, CCM [place] (listed on [facility] under Hospice[facility])   [phone]

## 2020-07-26 NOTE — Progress Notes (Signed)
FPTS Interim Progress Note  S: Patient examined at bedside after receiving call from day shift nurse which reported scant blood in stool reported by the tech. Tech reports brown stool with streak of blood, with no blood or pain noted on wiping. Upon exam, patient seems comfortable in bed. Denies nausea, abdominal pain, history of hemorrhoids, pain with defecation. Patient states this is his first bowel movement since Sunday. Wife, [name], in the room.  O: BP 102/68 (BP Location: Right Arm)   Pulse 87   Temp 98.7 F (37.1 C) (Oral)   Resp 16   Ht 5' 2.99" (1.6 m)   Wt 73.7 kg   SpO2 98%   BMI 28.79 kg/m    Physical Exam:  General: 60 y.o. male in NAD, resting in bed, pleasant. Cardio: s1s2 noted, atrial fibrillation. Peripheral pulses in tact.  Lungs: no increased work of breathing on room air Abdomen: Soft, mild tenderness in LLQ next to surgical site, nondistended. Bowel sounds normoactive.  GU: Foley in place GI: Perianal warts visualized with stool residue without obvious signs of bleeding  A/P: Low suspicion for gastrointestinal bleed. More likely blood 2/2 straining, constipation, or anal fissure vs lesion vs hemorrhoid. Stat CBC ordered, not yet resulted. Patient NPO for now, will restart regular diet as able. No need to consult GI or vascular at this time, but due to patient being on heparin drip, we have low threshold for gastrointestinal bleeding workup and intervention.  Karle Plumber, Medical Student 07/26/2020, 1:55 PM Acting Intern, [place] Medicine Service pager [phone]

## 2020-07-26 NOTE — Progress Notes (Signed)
ANTICOAGULATION CONSULT NOTE - Follow Up Consult  Pharmacy Consult for heparin Indication: atrial fibrillation, recent venous and arterial clot, and acute stroke  Labs: Recent Labs    07/25/20 0347 07/25/20 1009 07/25/20 1617 07/25/20 2008 07/26/20 0309 07/26/20 1149 07/26/20 1326 07/26/20 2023  HGB 8.2*   < > 9.4*  --  8.9*  --  9.4*  --   HCT 25.4*   < > 28.9*  --  25.8*  --  27.3*  --   PLT 241  --  202  --  197  --  218  --   HEPARINUNFRC 0.28*   < >  --    < > 0.16* 0.16*  --  0.18*  CREATININE 0.83  --  0.77  --  0.79  --   --   --    < > = values in this interval not displayed.    Assessment: 60yo male with acute CVA. PMH s/f PFO, cancer, atrial fibrillation on lovenox PTA, recent venous and arterial clots, s/p R iliac thrombectomy with fasciotomy at [facility] on 07/04/20. Pharmacy has been consulted for heparin with a lower goal rate of 0.3-0.5 units/mL.    6-hr follow up heparin level remains subtherapeutic at 0.18 after the heparin infusion rate was increased to 1500 units/hr.   Of note, earlier today RN reported patient had scant red blood in stool, otherwise no bleeding reported. CBC trended down, received 2u PRBCs yesterday 2/22.  Hgb increased to 9.4 later today.  PLTC wnl. No issues with heparin infusion and IV site looks good per RN's report.   Given significant history of clots will increase heparin rate cautiously and continue monitoring for s/sx bleeding.   Goal of Therapy:  Heparin level 0.3-0.5 units/ml   Plan:  Increase heparin gtt to 1600 units/hr and recheck level in 6 hours Follow daily CBC, s/sx bleeding Follow up further plans for anticoagulation  Nicole Cella, RPh Clinical Pharmacist Please refer to [facility] for unit-specific pharmacist 07/26/2020 9:17 PM

## 2020-07-26 NOTE — Progress Notes (Addendum)
Family Medicine Teaching Service Daily Progress Note Intern Pager: [phone]  Patient name: Adam Bender. Medical record number: [id number] Date of birth: [birth year] Age: 60 y.o. Gender: male  Primary Care Provider: Patient, No Pcp Per Consultants: Neurology, hematology-oncology, vascular surgery   Code Status: FULL  Pt Overview and Major Events to Date:  Admitted 2/19: CVA work-up 2/21: Acute worsening of right foot pain, CTA showing left lower extremity clots 2/22: Left embolectomy with fasciotomy  Assessment and Plan: Adam Bender. is a 60 y.o. male presenting with slurred speech, left-sided facial droop, left-sided weakness found to have CVA and progression of metastatic disease. PMH is significant for metastatic malignant melanoma, brain metastases, atrial fibrillation with recent venous and arterial clot s/p right lower extremity thrombectomy and fasciotomy on 07/04/2020 and repeat thrombectomy on 07/07/2020.  S/p Left Iliac and Femoral Thrombectomy 2/22  History of Recurrent Right Iliac Artery Thrombus  Patient tolerated surgery well. States he feels much better than yesterday without leg pain. Wound on left groin dressed, no bleeding noted, nontender, drain in place. Foley in place, d/c today. -Monitor pain  -Vascular surgery recs: d/c JP drain, leave on heparin drip, encourage out of bed and mobilization  -Embolic material pathology showed organizing thrombus  -Heparin drip, goal level 0.3-0.5, rate per pharmacy  Metastatic Melanoma with Brain, Lung, Axillary Involvement Follows with Dr. Hinton Rao. On steroid taper, see below.  -Decadron taper: 2 mg x7 days (2/21-2/27), 1 mg x7days (2/28-3/6) -Pain control: oxycodone 10mg  q6h prn, tylenol 1000mg  q6 scheduled, gabapentin 100mg  TID, flexeril 10mg  q8 PRN, lidoderm patch, dilaudid 0.5 IV q4 PRN  -Hematology-oncology recs: immunotherapy treatment options plus radiation, but treatment is palliative and not curative    Left-Sided Weakness 2/2 Acute CVA Speech improved, weakness of LUE persistent. Sensation in tact.  -Heparin gtt, rate per pharmacy -PT/OT recs CIR, rehab admissions coordinator on board.  Goals of Care Patient and his wife understand the poor prognosis of Adam Bender disease. He has significant clot burden in relation to his metastatic cancer. Palliative care is meeting with patient and family regularly (allowed two visitors at a time), and all teams on board have discussed goals of care with them, appreciate all recommendations.  -Palliative consult: continue full scope treatment, full code, patient and wife are open to continuing cancer treatment and symptom relief; reasonable to transition to hospice/comfort care -Goals of care with rehab vs home   Atrial Fibrillation Follows with Dr. Roetta Sessions. In atrial fibrillation on exam but denies symptoms. Due to hypercoaguable state 2/2 cancer diagnosis, anticoagulation has been an ongoing challenge and patient continues to have systemic clotting. -Continue home rate and rhythm control -Anticoagulation: currently on heparin drip, will plan to continue heparin drip for entire admission. -Discuss outpatient anticoagulation therapy with pharmacy and hematology-oncology  Hyperglycemia in the Setting of No Diabetes Mellitus Diagnosis (A1c: 6.4%) Likely due to steroid administration. Sugars ranged from 109-242 over last 24 hours. 242 fasting this AM, 3 units given. -sSSI aspart, no basal at this time  Leukocytosis, improving Admission WBC 24.6>17.6. Likely in the setting of known metastatic melanoma and current steroid use.  Normocytic Anemia (Transfusion Threshold: 8) Admission Hgb 8.7. Over last 24 hours: 6.8>10.2>9.4>8.9 today. Received 2 units of PRBCs due to loss of 600cc intraoperatively. Likely in the setting of recent vascular procedure and known metastatic cancer.  -Monitor for bleeding at incision site  HFrEF, Previous EF 50% in  2021 Echo 06/17/20, EF 50-27%, diastolic parameters indeterminate. Euvolemic since admission.  FEN/GI: Regular diet  PPx: Heparin drip, pharm to dose/adjust rate  Disposition:  Status is: Inpatient   Remains inpatient appropriate because: IV treatments appropriate due to intensity of illness   Dispo:             Patient From: Home             Planned Disposition: Home vs CIR              Expected discharge date: Unknown             Medically stable for discharge: No  Subjective:  Patient examined at bedside, resting comfortably. No acute complaints. States surgery went well and was able to get up and walk shortly afterwards. No chest pain, shortness of breath, palpitations, nausea, vomiting. Reports having a bowel movement since admission, but feels slightly constipated today. No other concerns.   Objective: Temp:  [97.2 F (36.2 C)-98.5 F (36.9 C)] 98.4 F (36.9 C) (02/23 0328) Pulse Rate:  [63-112] 73 (02/23 0328) Resp:  [14-20] 20 (02/23 0328) BP: (92-117)/(54-71) 104/63 (02/23 0328) SpO2:  [96 %-100 %] 98 % (02/23 0328) Weight:  [73.7 kg] 73.7 kg (02/22 1058)  Physical Exam:  General: 60 y.o. male in NAD, sitting in bed. Comfortable, pleasant.  Cardio: s1s2 noted, no murmurs or gallops. In atrial fibrillation on exam, irregularly irregular rhythm. Strong R dorsalis pedis pulse, 1+ L DP pulse.  Lungs: Clear lung fields bilaterally, no increased work of breathing on room air Abdomen: Soft, non-tender to palpation, non-distended, bowel sounds present.  Skin: Warm and dry.  Extremities: No edema. Previous surgical site on left groin dry without staples, no erythema. Right groin surgical site dressed with drain in place. Wound vac in place on right shin, no tenderness or erythema. Right foot slightly pale and dusky around lateral malleolus, but stable, warm, and pulses intact. Left extremity warm with brisk capillary refill.  Neuro: LE 5/5 strength bilaterally. 2/5 strength on  LUE, 5/5 strength RUE.  Laboratory: Recent Labs  Lab 07/25/20 0347 07/25/20 1314 07/25/20 1425 07/25/20 1617 07/26/20 0309  WBC 19.4*  --   --  18.6* 17.6*  HGB 8.2*   < > 10.2* 9.4* 8.9*  HCT 25.4*   < > 30.0* 28.9* 25.8*  PLT 241  --   --  202 197   < > = values in this interval not displayed.   Recent Labs  Lab 07/22/20 2200 07/22/20 2212 07/25/20 0347 07/25/20 1314 07/25/20 1425 07/25/20 1617 07/26/20 0309  NA 134*   < > 133*   < > 134* 134* 131*  K 3.2*   < > 4.2   < > 5.2* 4.4 4.0  CL 100   < > 98  --   --  102 101  CO2 22   < > 24  --   --  21* 22  BUN 13   < > 18  --   --  17 14  CREATININE 0.72   < > 0.83  --   --  0.77 0.79  CALCIUM 8.0*   < > 8.3*  --   --  7.8* 7.6*  PROT 5.5*  --   --   --   --   --   --   BILITOT 0.4  --   --   --   --   --   --   ALKPHOS 294*  --   --   --   --   --   --  ALT 28  --   --   --   --   --   --   AST 27  --   --   --   --   --   --   GLUCOSE 213*   < > 115*  --   --  113* 129*   < > = values in this interval not displayed.    Imaging/Diagnostic Tests: LE ANGIO 07/25/2020 FINDINGS: 1. Submitted images demonstrate bilateral common iliac artery stents, with complete to near complete occlusion of the left common iliac artery stent. 2. Right common and external iliac arteries are patent. Balloon plasty of the left common iliac artery stent is seen. Final angiogram demonstrates patency of the left common iliac artery with continued mild narrowing at the origin.  CT ANGIO AO+BIFEM W OR WO CONTRAST 07/24/2020 IMPRESSION: 1. Acute occlusion of the indwelling left common iliac stent. 2. Increased burden of mural thrombus in the distal abdominal aorta resulting in up to approximately 40% stenosis just proximal to the indwelling kissing iliac stents. 3. Acute occlusion of the distal left common femoral artery extending into the proximal profunda and superficial femoral artery. 4. Embolic occlusion of the left proximal peroneal and  distal anterior tibial arteries.  MR BRAIN W WO CONTRAST 07/23/2020 IMPRESSION:  1. Multiple acute right MCA territory infarcts, largest in the temporoparietal region.  2. Minimally increased size of 4 mm right frontal operculum metastasis.  3. Essentially unchanged size of other lesions with decreased vasogenic edema in the temporal lobes.  4. Unchanged left cerebral leptomeningeal enhancement.   CT Code Stroke CTA Head/Neck W/WO contrast 07/22/2020 IMPRESSION:  1. Occlusion of a distal right M3 branch in the inferior right parietal region. 9 mL core infarction with slight loss of gray-white differentiation at the inferior right parietal region. 19 CC region of at risk brain with a mismatch volume of 19 mL.  2. No carotid bifurcation disease. No visible aortic atherosclerosis.  3. Hilar and mediastinal lymphadenopathy consistent with the diagnosis of metastatic disease.  DG Chest Port 1 View 07/22/2020 IMPRESSION: No definite acute cardiopulmonary process.   CT HEAD CODE STROKE WO CONTRAST 07/22/2020 IMPRESSION:  1. Known bilateral temporal lobe metastatic disease. Subtle architectural distortion of the inferior temporal lobe on the right. Some volume loss and distortion of the inferior and anterior left temporal lobe. Other tiny metastases previously seen by MRI are not specifically appreciable. No acute finding by CT. 2. ASPECTS is 10.    Karle Plumber, Medical Student 07/26/2020, 8:14 AM Acting Intern, [place] Intern pager: [phone], text pages welcome   FPTS Upper-Level Resident Addendum   I have independently interviewed and examined the patient. I have discussed the above with the original author and agree with their documentation. Please see also any attending notes.    Carollee Leitz MD PGY-2, [facility] 07/26/2020 1:45 PM  [place] Service pager: [phone] (text pages welcome through [place])

## 2020-07-26 NOTE — TOC Initial Note (Signed)
Transition of Care (TOC) - Initial/Assessment Note  Valentina Gu, BSN Transitions of Care Unit 4E- RN Case Manager See Treatment Team for direct phone #    Patient Details  Name: Adam Bender. MRN: [id number] Date of Birth: 1961/02/10  Transition of Care [facility]) CM/SW Contact:    Dawayne Patricia, RN Phone Number: 07/26/2020, 3:11 PM  Clinical Narrative:                 Pt from home with wife, admitted with CVA workup, 2/22: Left embolectomy with stenting. Referral received for outpt PC needs and financial hardship resources.  Call made to wife to discuss both. Discussed with wife options for outpt PC needs- confirmed choice for [facility] (now [facility]). Wife agreeable to referral for outpt PC follow up.  Also discussed financial hardship concerns, explained that [facility] did not have someone in house that assisted with applying for hardship programs- referred wife to call [place] to ask about any programs to assist, as well as calling the number on the bills to ask about any financial assistance that pt. May qualify for.  Referral called to Audrea Muscat with [facility] for outpt PC follow up.   TOC to continue to follow for any additional transition needs.    Expected Discharge Plan: IP Rehab Facility Barriers to Discharge: Continued Medical Work up   Patient Goals and CMS Choice Patient states their goals for this hospitalization and ongoing recovery are:: hopeful for INPT rehab CMS Medicare.gov Compare Post Acute Care list provided to:: Patient Represenative (must comment) Choice offered to / list presented to : [facility]  Expected Discharge Plan and Services Expected Discharge Plan: [place] In-house Referral: Hospice / Palliative Care Discharge Planning Services: CM Consult,Other - See comment Post Acute Care Choice: IP Rehab Living arrangements for the past 2 months: Single Family Home                                       Prior Living Arrangements/Services Living arrangements for the past 2 months: Single Family Home Lives with:: Spouse Patient language and need for interpreter reviewed:: Yes Do you feel safe going back to the place where you live?: Yes      Need for Family Participation in Patient Care: Yes (Comment) Care giver support system in place?: Yes (comment) Current home services: DME Criminal Activity/Legal Involvement Pertinent to Current Situation/Hospitalization: No - Comment as needed  Activities of Daily Living      Permission Sought/Granted Permission sought to share information with : Chartered certified accountant granted to share information with : Yes, Verbal Permission Granted     Permission granted to share info w AGENCY: [facility]        Emotional Assessment       Orientation: : Oriented to Self,Oriented to Place,Oriented to  Time,Oriented to Situation Alcohol / Substance Use: Not Applicable Psych Involvement: No (comment)  Admission diagnosis:  Stroke [facility]) [I63.9] Left arm numbness [R20.0] Left arm weakness [R29.898] Cerebrovascular accident (CVA), unspecified mechanism ([place]) [I63.9] Patient Active Problem List   Diagnosis Date Noted  . Stroke ([place]) 07/23/2020  . Iliac artery thrombosis, right ([place]) 07/23/2020  . 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 Left arm numbness   . Left arm weakness   . Nausea with vomiting 06/29/2020  . Swollen lymph nodes 06/26/2020  . Pulmonary nodules 05/10/2020  Class: Chronic  . Atrial fibrillation with rapid ventricular response ([place]) 02/29/2020  . Depressed left ventricular ejection fraction 02/29/2020  . Malignant melanoma ([place]) 02/20/2020  . Brain metastases ([place]) 01/11/2020    Class: Chronic  . Skin, metastatic cancer to [facility]) 01/11/2020    Class: Chronic   PCP:  Patient, No Pcp Per Pharmacy:   [facility] [address] [place] [address] [place]  [place] [address] Phone: [phone] Fax: [phone]     Social Determinants of Health (SDOH) Interventions    Readmission Risk Interventions No flowsheet data found.

## 2020-07-26 NOTE — Progress Notes (Signed)
Physical Therapy Treatment Patient Details Name: Adam Bender. MRN: [id number] DOB: July 16, 1960 Today's Date: 07/26/2020    History of Present Illness 60yo male presenting with acute CVA symptoms likely due to embolic event and related ischemia. Found to have multiple R MCA CVA acute infarcts. PMH metastatic malignant melanoma with brain mets, A-fib with recent venous and arterial clots s/p thrombectomy, hypercoagulable state due to high level cancer    PT Comments    Pt received in supine, agreeable to therapy session and with good participation and tolerance for mobility. Pt with improved LUE mobility compared with previous session and given LUE exercises to perform, will benefit from handout for compliance and pt also requesting squeeze ball for next session. Pt performed functional mobility tasks with up to minA and agreeable to sit up in chair for dinner. HR elevated up to 125 bpm during mobility otherwise VSS. Pt continues to benefit from PT services to progress toward functional mobility goals. Continue to recommend CIR.   Follow Up Recommendations  CIR;Supervision for mobility/OOB     Equipment Recommendations  Rolling walker with 5" wheels;3in1 (PT);Wheelchair (measurements PT);Wheelchair cushion (measurements PT)    Recommendations for Other Services       Precautions / Restrictions Precautions Precautions: Fall;Other (comment) Precaution Comments: L hemi (LUE>LLE), hypercoagulable 2/2 cancer Restrictions Weight Bearing Restrictions: Yes RLE Weight Bearing: Weight bearing as tolerated LLE Weight Bearing: Weight bearing as tolerated    Mobility  Bed Mobility Overal bed mobility: Needs Assistance Bed Mobility: Supine to Sit     Supine to sit: Min assist     General bed mobility comments: Patient able to advance BLE from bed surface to EOB and uses rail with RUE to R EOB, min guard for safety/lines.    Transfers Overall transfer level: Needs  assistance Equipment used: Rolling walker (2 wheeled);1 person hand held assist Transfers: Sit to/from [facility] Sit to Stand: Min assist Stand pivot transfers: Min assist       General transfer comment: Light Min A for sit to stand from EOB x2 trials with cues for hand placement and technique, light assist to manage RW/lines during stand pivot transfer and to keep LUE on RW handle  Ambulation/Gait Ambulation/Gait assistance: Min assist Gait Distance (Feet): 3 Feet Assistive device: Rolling walker (2 wheeled) Gait Pattern/deviations: Step-to pattern     General Gait Details: minA at most for stability, only with light LUE grip on RW so assist to maintain   Stairs             Wheelchair Mobility    Modified [name] (Stroke Patients Only)       Balance Overall balance assessment: Needs assistance Sitting-balance support: Single extremity supported;Feet supported Sitting balance-Leahy Scale: Fair Sitting balance - Comments: Patient with mild posterior LOB with donning footwear but able to self correct.   Standing balance support: Single extremity supported;Bilateral upper extremity supported;During functional activity Standing balance-Leahy Scale: Poor Standing balance comment: Reliant on at least unilateral UE support to maintain standing balance.                            Cognition Arousal/Alertness: Awake/alert Behavior During Therapy: WFL for tasks assessed/performed Overall Cognitive Status: Within Functional Limits for tasks assessed                                 General Comments: Answered questions  and followed cues appropriately, A&Ox4      Exercises Other Exercises Other Exercises: LUE AROM: elbow flex/ext, wrist flex/ext, finger gross grasp/ext, shoulder flex/abd 1x5 reps ea encouraged pt to perform frequently Other Exercises: Standing BLE AROM: hip flexion with high knees x5 reps    General Comments  General comments (skin integrity, edema, etc.): improved LUE ROM, encouraged frequent use of LUE pt able to wipe face with wash cloth      Pertinent Vitals/Pain Pain Assessment: No/denies pain Pain Intervention(s): Monitored during session;Repositioned    Home Living                      Prior Function            PT Goals (current goals can now be found in the care plan section) Acute Rehab PT Goals Patient Stated Goal: be as independent as possible at home PT Goal Formulation: With patient/family Time For Goal Achievement: 08/07/20 Potential to Achieve Goals: Good Progress towards PT goals: Progressing toward goals    Frequency    Min 3X/week      PT Plan Current plan remains appropriate    Co-evaluation              AM-PAC PT "6 Clicks" Mobility   Outcome Measure  Help needed turning from your back to your side while in a flat bed without using bedrails?: A Little Help needed moving from lying on your back to sitting on the side of a flat bed without using bedrails?: A Little Help needed moving to and from a bed to a chair (including a wheelchair)?: A Little Help needed standing up from a chair using your arms (e.g., wheelchair or bedside chair)?: A Little Help needed to walk in hospital room?: A Lot Help needed climbing 3-5 steps with a railing? : Total 6 Click Score: 15    End of Session Equipment Utilized During Treatment: Gait belt Activity Tolerance: Patient tolerated treatment well;Other (comment) (HR elevated to 125 bpm) Patient left: in chair;with call bell/phone within reach;with chair alarm set;with family/visitor present (heels floated) Nurse Communication: Mobility status PT Visit Diagnosis: Unsteadiness on feet (R26.81);Muscle weakness (generalized) (M62.81);Other symptoms and signs involving the nervous system (R29.898);Difficulty in walking, not elsewhere classified (R26.2)     Time: 1610-9604 PT Time Calculation (min) (ACUTE  ONLY): 30 min  Charges:  $Therapeutic Exercise: 8-22 mins $Therapeutic Activity: 8-22 mins                     Carly P., PTA Acute Rehabilitation Services Pager: [phone] Office: [place] 07/26/2020, 6:12 PM

## 2020-07-26 NOTE — Evaluation (Signed)
Clinical/Bedside Swallow Evaluation Patient Details  Name: Adam Bender. MRN: [id number] Date of Birth: February 05, 1961  Today's Date: 07/26/2020 Time: SLP Start Time (ACUTE ONLY): 0900 SLP Stop Time (ACUTE ONLY): 0914 SLP Time Calculation (min) (ACUTE ONLY): 14 min  Past Medical History:  Past Medical History:  Diagnosis Date  . Atrial fibrillation ([place]) 02/29/2020  . Depressed left ventricular ejection fraction 02/29/2020  . Malignant melanoma of other part of trunk ([place]) 02/20/2020  . Malignant melanoma of skin of trunk, except scrotum [facility])    Past Surgical History:  Past Surgical History:  Procedure Laterality Date  . MOLE REMOVAL    . SALIVARY GLAND SURGERY     HPI:  Pt is a 60 y.o. male with PMH is significant for metastatic malignant melanoma, brain metastases, atrial fibrillation with recent venous and arterial clot s/p vascular surgery 2 weeks prior to admission. He presented with slurred speech, left-sided facial droop, left-sided weakness. MRI brain: Multiple acute right MCA territory infarcts, largest in the temporoparietal region. Minimally increased size of 4 mm right frontal operculum metastasis. Palliative care has been consulted and plan is continue full scope of care.   Assessment / Plan / Recommendation Clinical Impression  Pt was seen for bedside swallow evaluation and he denied a history of dysphagia. Oral mechanism exam was [facility] and he presented with limited mandibular teeth only. He tolerated all solids and liquids without signs or symptoms of oropharyngeal dysphagia. A regular texture diet with thin liquids is recommended at this time and further skilled SLP services are not clinically indicated for swallowing. SLP Visit Diagnosis: Dysphagia, unspecified (R13.10)    Aspiration Risk  No limitations    Diet Recommendation Regular;Thin liquid   Liquid Administration via: Cup;Straw Medication Administration: Whole meds with liquid Supervision: Patient able to  self feed Postural Changes: Seated upright at 90 degrees    Other  Recommendations Oral Care Recommendations: Oral care BID   Follow up Recommendations None      Frequency and Duration            Prognosis        Swallow Study   General Date of Onset: 07/23/20 HPI: Pt is a 60 y.o. male with PMH is significant for metastatic malignant melanoma, brain metastases, atrial fibrillation with recent venous and arterial clot s/p vascular surgery 2 weeks prior to admission. He presented with slurred speech, left-sided facial droop, left-sided weakness. MRI brain: Multiple acute right MCA territory infarcts, largest in the temporoparietal region. Minimally increased size of 4 mm right frontal operculum metastasis. Palliative care has been consulted and plan is continue full scope of care. Type of Study: Bedside Swallow Evaluation Previous Swallow Assessment: None Diet Prior to this Study: Regular;Thin liquids Temperature Spikes Noted: No Respiratory Status: Room air History of Recent Intubation: No Behavior/Cognition: Alert;Cooperative;Pleasant mood Oral Cavity Assessment: Within Functional Limits Oral Care Completed by SLP: No Oral Cavity - Dentition:  (natural mandibular teeth, absent maxillary dentition) Vision: Functional for self-feeding Self-Feeding Abilities: Able to feed self Patient Positioning: Upright in bed;Postural control adequate for testing Baseline Vocal Quality: Normal Volitional Cough: Strong Volitional Swallow: Able to elicit    Oral/Motor/Sensory Function Overall Oral Motor/Sensory Function: Within functional limits   Ice Chips Ice chips: Within functional limits Presentation: Spoon   Thin Liquid Thin Liquid: Within functional limits Presentation: Straw    Nectar Thick Nectar Thick Liquid: Not tested   Honey Thick Honey Thick Liquid: Not tested   Puree Puree: Within functional limits Presentation: Spoon  Solid     Solid: Within functional  limits Presentation: Self Fed     Jakira Mcfadden I. Hardin Negus, [place], [place] Office number [phone] Pager [phone]  Horton Marshall 07/26/2020,9:16 AM

## 2020-07-26 NOTE — Progress Notes (Addendum)
ANTICOAGULATION CONSULT NOTE - Follow Up Consult  Pharmacy Consult for heparin Indication: atrial fibrillation, recent venous and arterial clot, and acute stroke  Labs: Recent Labs    07/25/20 0347 07/25/20 1009 07/25/20 1314 07/25/20 1425 07/25/20 1617 07/25/20 2008 07/26/20 0309  HGB 8.2*  --    < > 10.2* 9.4*  --  8.9*  HCT 25.4*  --    < > 30.0* 28.9*  --  25.8*  PLT 241  --   --   --  202  --  197  HEPARINUNFRC 0.28* 0.21*  --   --   --  0.60 0.16*  CREATININE 0.83  --   --   --  0.77  --  0.79   < > = values in this interval not displayed.    Assessment: 60yo male subtherapeutic on heparin after rate decrease for high level though had rec'd multiple boluses in OR and likely had not yet cleared; no gtt issues or signs of bleeding per RN.  Goal of Therapy:  Heparin level 0.3-0.5 units/ml   Plan:  Will increase heparin gtt slightly to 1400 units/hr and check level in 6 hours.    Wynona Neat, PharmD, BCPS  07/26/2020,4:21 AM

## 2020-07-26 NOTE — Consult Note (Addendum)
[place] Nurse wound follow up Patient receiving care in [facility] 4E19 Wound type: Medial and laterial fasciotomies of the RLE.      No changes in the wound bed of both wounds.  Dressingprocedure/placement/frequency:  Wound Vac change today. Removed 3 pieces of black foam. Cleansed the wounds with NS. Minor bleeding. Mepitel was used over both wounds followed by 1 piece of black foam to each wound. Drape placed over the foam, suction obtained at 125 mmHg. The wounds were "Y"d together with an adapter with one vac machine. Patient tolerated the dressing change very well.  Supplies at bedside.   Ginger Blue nurse to change NPWT dressings M/W/F.  Monitor the wound area(s) for worsening of condition such as: Signs/symptoms of infection, increase in size, development of or worsening of odor, development of pain, or increased pain at the affected locations.   Notify the medical team if any of these develop.  Thank you for the consult. Please re-consult the [facility] team if needed.  Cathlean Marseilles Tamala Julian, MSN, RN, [place], Lysle Pearl, [place] [facility] Associate Pager [phone]

## 2020-07-26 NOTE — Progress Notes (Addendum)
  Progress Note    07/26/2020 7:46 AM 1 Day Post-Op  Subjective:  Sitting up in bed eating breakfast. Still complaining of coldness in feet but says it is much better   Vitals:   07/26/20 0300 07/26/20 0328  BP: 92/61 104/63  Pulse: 76 73  Resp:  20  Temp:  98.4 F (36.9 C)  SpO2: 97% 98%   Physical Exam: Cardiac: regular rate and rhythm Lungs: non labored Incisions: Left groin incision clean, dry and intact. No swelling or hematoma. JP drain with 20 cc serosanguinous output over night Extremities: 2+ femoral pulses bilaterally, brisk right PT and peroneal signal, brisk left, Dp and Pt signals. Both feet warm and well perfused. Motor and sensation intact Abdomen:  Soft, non tender, non distended Neurologic: alert and oriented  CBC    Component Value Date/Time   WBC 17.6 (H) 07/26/2020 0309   RBC 2.93 (L) 07/26/2020 0309   HGB 8.9 (L) 07/26/2020 0309   HGB 15.2 04/14/2020 1446   HCT 25.8 (L) 07/26/2020 0309   PLT 197 07/26/2020 0309   PLT 363 04/14/2020 1446   MCV 88.1 07/26/2020 0309   MCH 30.4 07/26/2020 0309   MCHC 34.5 07/26/2020 0309   RDW 14.5 07/26/2020 0309   LYMPHSABS 1.4 07/22/2020 2200   MONOABS 0.7 07/22/2020 2200   EOSABS 0.0 07/22/2020 2200   BASOSABS 0.0 07/22/2020 2200    BMET    Component Value Date/Time   NA 131 (L) 07/26/2020 0309   NA 138 06/29/2020 0000   K 4.0 07/26/2020 0309   CL 101 07/26/2020 0309   CO2 22 07/26/2020 0309   GLUCOSE 129 (H) 07/26/2020 0309   BUN 14 07/26/2020 0309   BUN 9 06/29/2020 0000   CREATININE 0.79 07/26/2020 0309   CREATININE 1.07 04/14/2020 1446   CALCIUM 7.6 (L) 07/26/2020 0309   GFRNONAA >60 07/26/2020 0309   GFRNONAA >60 04/14/2020 1446   GFRAA 109 02/29/2020 1538    INR    Component Value Date/Time   INR 1.0 07/22/2020 2200     Intake/Output Summary (Last 24 hours) at 07/26/2020 0746 Last data filed at 07/26/2020 0600 Gross per 24 hour  Intake 3002.13 ml  Output 2220 ml  Net 782.13 ml      Assessment/Plan:  60 y.o. male is s/p Left iliac and femoral thrombectomy, abdominal aortogram and pelvic angiogram, left common iliac stent (VBX 8 x 60 postdilated with a 10 balloon)  1 Day Post-Op. Pain improved. Brisk doppler signals bilateral feet. Left groin incision intact. JP drain may be able to come out later today. Minimal output overnight. Hemodynamically stable. Multimodal pain control. PT/ OT evaluation. Encourage oob and mobilization   DVT prophylaxis: IV Heparin    Karoline Caldwell, PA-C [facility] [phone] 07/26/2020 7:46 AM   Agree with above D/c drain today DO NOT STOP heparin Would leave on IV heparin until disposition plan is in place Pt is very high risk for recurrent embolus Follow up path on embolic material Compartments soft incision clean left groin 2+ left PT pulse 1+ right PT pulse  D/c foley  Ruta Hinds, MD [facility] [place] Office: [phone]

## 2020-07-26 NOTE — Progress Notes (Signed)
ANTICOAGULATION CONSULT NOTE - Follow Up Consult  Pharmacy Consult for heparin Indication: atrial fibrillation, recent venous and arterial clot, and acute stroke  Labs: Recent Labs    07/25/20 0347 07/25/20 1009 07/25/20 1425 07/25/20 1617 07/25/20 2008 07/26/20 0309 07/26/20 1149  HGB 8.2*   < > 10.2* 9.4*  --  8.9*  --   HCT 25.4*   < > 30.0* 28.9*  --  25.8*  --   PLT 241  --   --  202  --  197  --   HEPARINUNFRC 0.28*   < >  --   --  0.60 0.16* 0.16*  CREATININE 0.83  --   --  0.77  --  0.79  --    < > = values in this interval not displayed.    Assessment: 60yo male with acute CVA. PMH s/f PFO, cancer, atrial fibrillation on lovenox PTA, recent venous and arterial clots, s/p R iliac thrombectomy with fasciotomy at [facility] on 07/04/20. Pharmacy has been consulted for heparin with a lower goal rate of 0.3-0.5 units/mL.   6-hr follow up heparin level remains subtherapeutic at 0.16. Of note, pt had scant red blood in stool, otherwise no bleeding reported. CBC trending down, received 2u PRBCs yesterday 2/23.  Given significant history of clots will increase heparin rate cautiously and continue monitoring for s/sx bleeding.   Goal of Therapy:  Heparin level 0.3-0.5 units/ml   Plan:  Increase heparin gtt slightly to 1500 units/hr and recheck level in 6 hours Follow daily CBC, s/sx bleeding Follow up further plans for anticoagulation  Mercy Riding, PharmD PGY1 Acute Care Pharmacy Resident Please refer to [facility] for unit-specific pharmacist

## 2020-07-26 NOTE — Progress Notes (Signed)
Scant red blood reported by NT in stool.   MD paged.  MD returned call & aware.

## 2020-07-26 NOTE — Progress Notes (Signed)
Plan of care reviewed. Pt's alert and oriented x 4. We allowed Pt's daughter to stay overnight tonight per MD noted. Pt's hemodynamically stable. Atrial fib on monitor, HR 68-112, BP stable, afebrile, on room air SPO2 96-98%, no acute distress noted.   Right and left groin had no hematoma. Right anterior tibial with wound vac, JP drain on left groin had no active bleeding. On Heparin gtt titrated by pharmacist. Pain tolerated well. Dilaudid and Oxy and Flexeril given PRN. Pt able to rest well tonight.   Right DP and PT pulses had faint signals with doppler. Left DP and PT  were palpable and patent. We will continue to monitor.  Kennyth Lose, RN

## 2020-07-27 LAB — CBC
HCT: 27.1 % — ABNORMAL LOW (ref 39.0–52.0)
Hemoglobin: 9 g/dL — ABNORMAL LOW (ref 13.0–17.0)
MCH: 29.6 pg (ref 26.0–34.0)
MCHC: 33.2 g/dL (ref 30.0–36.0)
MCV: 89.1 fL (ref 80.0–100.0)
Platelets: 225 10*3/uL (ref 150–400)
RBC: 3.04 MIL/uL — ABNORMAL LOW (ref 4.22–5.81)
RDW: 14.6 % (ref 11.5–15.5)
WBC: 16.8 10*3/uL — ABNORMAL HIGH (ref 4.0–10.5)
nRBC: 0 % (ref 0.0–0.2)

## 2020-07-27 LAB — BASIC METABOLIC PANEL
Anion gap: 9 (ref 5–15)
BUN: 11 mg/dL (ref 6–20)
CO2: 21 mmol/L — ABNORMAL LOW (ref 22–32)
Calcium: 8.2 mg/dL — ABNORMAL LOW (ref 8.9–10.3)
Chloride: 101 mmol/L (ref 98–111)
Creatinine, Ser: 0.65 mg/dL (ref 0.61–1.24)
GFR, Estimated: >60 mL/min (ref >60)
Glucose, Bld: 129 mg/dL — ABNORMAL HIGH (ref 70–99)
Potassium: 4.1 mmol/L (ref 3.5–5.1)
Sodium: 131 mmol/L — ABNORMAL LOW (ref 135–145)

## 2020-07-27 LAB — HEPARIN LEVEL (UNFRACTIONATED)
Heparin Unfractionated: 0.26 IU/mL — ABNORMAL LOW (ref 0.30–0.70)
Heparin Unfractionated: 0.25 IU/mL — ABNORMAL LOW (ref 0.30–0.70)
Heparin Unfractionated: 0.28 IU/mL — ABNORMAL LOW (ref 0.30–0.70)

## 2020-07-27 LAB — GLUCOSE, CAPILLARY
Glucose-Capillary: 127 mg/dL — ABNORMAL HIGH (ref 70–99)
Glucose-Capillary: 145 mg/dL — ABNORMAL HIGH (ref 70–99)
Glucose-Capillary: 206 mg/dL — ABNORMAL HIGH (ref 70–99)
Glucose-Capillary: 140 mg/dL — ABNORMAL HIGH (ref 70–99)

## 2020-07-27 LAB — CULTURE, BLOOD (ROUTINE X 2)

## 2020-07-27 NOTE — Progress Notes (Signed)
ANTICOAGULATION CONSULT NOTE - Follow Up Consult  Pharmacy Consult for heparin Indication: atrial fibrillation, recent venous and arterial clot, and acute stroke  Labs: Recent Labs    07/25/20 1617 07/25/20 2008 07/26/20 0309 07/26/20 1149 07/26/20 1326 07/26/20 2023 07/27/20 0358  HGB 9.4*  --  8.9*  --  9.4*  --   --   HCT 28.9*  --  25.8*  --  27.3*  --   --   PLT 202  --  197  --  218  --   --   HEPARINUNFRC  --    < > 0.16* 0.16*  --  0.18* 0.26*  CREATININE 0.77  --  0.79  --   --   --  0.65   < > = values in this interval not displayed.    Assessment: 60yo male with acute CVA. PMH s/f PFO, cancer, atrial fibrillation on lovenox PTA, recent venous and arterial clots, s/p R iliac thrombectomy with fasciotomy at [facility] on 07/04/20. Pharmacy has been consulted for heparin with a lower goal rate of 0.3-0.5 units/mL.  Heparin level this am 0.26 units/ml No issues with heparin infusion.  No bleeding reported Given significant history of clots will increase heparin rate cautiously and continue monitoring for s/sx bleeding.   Goal of Therapy:  Heparin level 0.3-0.5 units/ml   Plan:  Increase heparin gtt to 1650 units/hr and recheck level in 6 hours Follow daily CBC, s/sx bleeding Follow up further plans for anticoagulation Thanks for allowing pharmacy to be a part of this patient's care.  Excell Seltzer, PharmD Clinical Pharmacist  07/27/2020 4:46 AM

## 2020-07-27 NOTE — Progress Notes (Signed)
[facility] [place]  Subjective  - legs feel ok   Objective 101/66 73 97.6 F (36.4 C) (Oral) 12 98%  Intake/Output Summary (Last 24 hours) at 07/27/2020 0809 Last data filed at 07/27/2020 0300 Gross per 24 hour  Intake 567.42 ml  Output 1200 ml  Net -632.58 ml   Groin incisions healing no hematoma 2+ left PT pulse Right PT non palpable today but foot is pink and warm some ankle ecchymosis  Assessment/Planning: S/p left leg embolectomy and re stent left iliac Still adjusting heparin.  He has never been at a therapeutic level since admission. Path c/w embolic material  Would keep on therapeutic heparin until time of d/c   Probably only option as outpt is supratherapeutic lovenox dose as suggested by Oncology earlier this admission.  Risk is going to be intracranial bleed with brain mets.  Ok for d/c from my standpoint.  Pt very high risk for recurrent embolization.  Ruta Hinds 07/27/2020 8:09 AM --  Laboratory Lab Results: Recent Labs    07/26/20 0309 07/26/20 1326  WBC 17.6* 18.4*  HGB 8.9* 9.4*  HCT 25.8* 27.3*  PLT 197 218   BMET Recent Labs    07/26/20 0309 07/27/20 0358  NA 131* 131*  K 4.0 4.1  CL 101 101  CO2 22 21*  GLUCOSE 129* 129*  BUN 14 11  CREATININE 0.79 0.65  CALCIUM 7.6* 8.2*    COAG Lab Results  Component Value Date   INR 1.0 07/22/2020   No results found for: PTT

## 2020-07-27 NOTE — Progress Notes (Addendum)
Representative from [facility] called unit asking for additional patient insurance information, informed her I will pass this along to day shift RN and to try contacting IP rehab coordinator in AM.

## 2020-07-27 NOTE — Progress Notes (Signed)
ANTICOAGULATION CONSULT NOTE - Follow Up Consult  Pharmacy Consult for heparin Indication: atrial fibrillation, recent venous and arterial clot, and acute stroke  Labs: Recent Labs    07/25/20 1617 07/25/20 2008 07/26/20 0309 07/26/20 1149 07/26/20 1326 07/26/20 2023 07/27/20 0358 07/27/20 0910 07/27/20 1144  HGB 9.4*  --  8.9*  --  9.4*  --   --  9.0*  --   HCT 28.9*  --  25.8*  --  27.3*  --   --  27.1*  --   PLT 202  --  197  --  218  --   --  225  --   HEPARINUNFRC  --    < > 0.16*   < >  --  0.18* 0.26*  --  0.25*  CREATININE 0.77  --  0.79  --   --   --  0.65  --   --    < > = values in this interval not displayed.    Assessment: 60yo male with acute CVA. PMH s/f PFO, cancer, atrial fibrillation on lovenox PTA, recent venous and arterial clots, s/p R iliac thrombectomy with fasciotomy at [facility] on 07/04/20. Pharmacy has been consulted for heparin with a lower goal rate of 0.3-0.5 units/mL.    Heparin level remains subtherapeutic and did not increase (0.26>0.25) after the heparin infusion rate was increased to 1650 units/hr.   Of note, yesterday RN reported scant red blood in stool, otherwise no bleeding reported. CBC trended down, received 2u PRBCs 2/22.  Hgb 9 today.  PLTC wnl. No issues with heparin infusion or bleeding and IV site looks good per RN.   Given significant history of clots will increase heparin rate cautiously and continue monitoring for s/sx bleeding.   Goal of Therapy:  Heparin level 0.3-0.5 units/ml   Plan:  Increase heparin gtt to 1750 units/hr and recheck level in 6 hours Follow daily CBC, s/sx bleeding Plan is to discharge patient on slightly supratherapeutic Lovenox 80 mg BID per Heme/onc recommendations   Thank you for allowing [place] to participate in this patients care.   Jens Som, PharmD Please see amion for complete clinical pharmacist phone list. 07/27/2020 1:22 PM

## 2020-07-27 NOTE — Progress Notes (Signed)
Inpatient Rehabilitation Admissions Coordinator  I met at bedside with patient and his Mom. I also spoke with his wife via phone. We dicussed goals and expectations of a possible CIR admit. They are in agreement. I will begin insurance authorization with [facility] for a possible CIR admit.   , RN, MSN Rehab Admissions Coordinator [phone] 07/27/2020 11:37 AM  

## 2020-07-27 NOTE — Progress Notes (Signed)
ANTICOAGULATION CONSULT NOTE - Follow Up Consult  Pharmacy Consult for heparin Indication: atrial fibrillation, recent venous and arterial clot, and acute stroke  Labs: Recent Labs    07/25/20 1617 07/25/20 2008 07/26/20 0309 07/26/20 1149 07/26/20 1326 07/26/20 2023 07/27/20 0358 07/27/20 0910 07/27/20 1144 07/27/20 1925  HGB 9.4*  --  8.9*  --  9.4*  --   --  9.0*  --   --   HCT 28.9*  --  25.8*  --  27.3*  --   --  27.1*  --   --   PLT 202  --  197  --  218  --   --  225  --   --   HEPARINUNFRC  --    < > 0.16*   < >  --    < > 0.26*  --  0.25* 0.28*  CREATININE 0.77  --  0.79  --   --   --  0.65  --   --   --    < > = values in this interval not displayed.    Assessment: 60yo male with acute CVA. PMH s/f PFO, cancer, atrial fibrillation on lovenox PTA, recent venous and arterial clots, s/p R iliac thrombectomy with fasciotomy at [facility] on 07/04/20. Pharmacy has been consulted for heparin with a lower goal rate of 0.3-0.5 units/mL.    Heparin level remains subtherapeutic at 0.28,  Slight increas upward after heparin infusion rate was increased to 1750 units/hr.   Of note, yesterday 2/23 RN reported scant red blood in stool, otherwise no bleeding reported. CBC trended down this morning 2/24, received 2u PRBCs 2/22.  Hgb 9 today.  PLTC wnl. Currently no issues with heparin infusion,  IV site looks good and no bleeding noted  per RN.   Given significant history of clots will increase heparin rate cautiously and continue monitoring for s/sx bleeding.   Goal of Therapy:  Heparin level 0.3-0.5 units/ml   Plan:  Increase heparin gtt to 1850 units/hr and recheck level in ~ 6 hours  Follow daily CBC, s/sx bleeding Plan is to discharge patient on slightly supratherapeutic Lovenox 80 mg BID per Heme/onc recommendations   Thank you for allowing [place] to participate in this patients care. Nicole Cella, RPh Clinical Pharmacist Please see amion for complete clinical pharmacist phone  list. 07/27/2020 8:16 PM

## 2020-07-27 NOTE — Progress Notes (Signed)
Family Medicine Teaching Service Daily Progress Note Intern Pager: [phone]  Patient name: Adam Bender. Medical record number: [id number] Date of birth: Nov 14, 1960 Age: 60 y.o. Gender: male  Primary Care Provider: Patient, No Pcp Per Consultants: Neurology, hematology-oncology, vascular surgery   Code Status: FULL  Pt Overview and Major Events to Date:  Admitted 2/19: CVA work-up 2/21: Acute worsening of right foot pain, CTA showing left lower extremity clots 2/22: Left embolectomy with fasciotomy  Assessment and Plan: Adam Bender a 60 y.o.malepresenting with slurred speech, left-sided facial droop, left-sided weakness found to have CVA and progression of metastatic disease. PMH is significant formetastatic malignant melanoma, brain metastases, atrial fibrillation with recent venous and arterial clot s/p right lower extremity thrombectomy and fasciotomy on 07/04/2020 and repeat thrombectomy on 07/07/2020.  S/p Left Iliac and Femoral Thrombectomy 2/22  History of Recurrent Right Iliac Artery Thrombus  Embolic material pathology showed organizing thrombus. On heparin drip. Vascular surgery was in room when visiting patient, doppler revealed DP and PT pulses present on the left lower extremity, with only PT pulse present on the right lower extremity. Unchanged from yesterday's exam.  Vascular surgery has signed off at this time with no further recommendations.  [name] is now stable for discharge to CIR or home, based on his preference. -Monitor pain  -Vascular surgery recs: leave on heparin drip until d/c, probably only option for outpatient anticoagulation is supratherapeutic lovenox, encourage out of bed and mobilization, okay for discharge from vascular standpoint  -Dr. Hinton Rao w/ oncology has previously recommended lovenox 80 mg BID -Continue heparin drip, goal level 0.3-0.5, rate per pharmacy (increased to 1650u/hr this am) -Plan to discharge on Lovenox 80 mg twice  daily  Metastatic Melanoma with Brain, Lung, Axillary Involvement Follows with Dr. Hinton Rao. On steroid taper, see below. CTA performed earlier this admission demonstrated significant progression of lung masses, axillary mass and potential new kidney metastasis. -Decadron taper: 2 mg x7 days (2/21-2/27), 1 mg x7days (2/28-3/6) -Hematology-oncology recs: immunotherapy treatment options plus radiation, but treatment is palliative and not curative   Goals of Care Patient and his wife understand the poor prognosis of Adam Bender's disease. He has significant clot burden in relation to his metastatic cancer. Palliative care is meeting with patient and family regularly (unrestricted visitor access), and all teams on board have discussed goals of care with them, appreciate all recommendations.  -Palliative care will continue to follow in the outpatient setting  Positive Blood Culture (1/4 bottles growing Staphylococcus epidermidis) Likely contaminant due to only one bottle positive and took 3 days to grow. Additionally, patient's vitals have remained stable, afebrile, leukocytosis in the setting of steroids improving. However, this patient is considered high risk due to malignancy and multiple hospitalizations and surgeries. -Monitor vitals and CBC -Low threshold to repeat blood cultures  Normocytic Anemia (Transfusion Threshold: 8) Admission Hgb 8.7. Likely in the setting of recent vascular procedure and known metastatic cancer. Blood streaked stool noted yesterday, patient asymptomatic. Follow up CBC showed Hgb 9.4, which increased from 8.9. No bowel movement yet today, but denies hematuria, hemoptysis, excess bleeding from wound site. -Daily CBC -Monitor for signs and symptoms of bleeding  Left-Sided Weakness 2/2 Acute CVA Speech improved, weakness of LUE persistent. Sensation in tact.  -Heparin gtt, rate per pharmacy -PT recs CIR, rehab admissions coordinator on board  Atrial  Fibrillation Follows with Dr. Roetta Sessions. In atrial fibrillation on exam but denies symptoms.Due to hypercoaguable state 2/2 cancer diagnosis, anticoagulation has been an ongoing  challenge and patient continues to have systemic clotting. -Continue home rate and rhythm control, Digoxin level 0.5 yesterday -Anticoagulation: currently on heparin drip, will plan to continue heparin drip for entire admission. -Outpatient anticoagulation therapy pending   Hyperglycemia in the Setting of No Diabetes Mellitus Diagnosis (A1c: 6.4%) Likely due to steroid administration. Sugars ranged from 127-156 over last 24 hours. 2u aspart given. -sSSI aspart, no basal at this time  Leukocytosis, improving Admission WBC 24.6>18.4. Likely in the setting of known metastatic melanoma and current steroid use.  HFrEF, Previous EF 50% in 2021 Echo 06/17/20, EF 95-18%, diastolic parameters indeterminate. Euvolemic since admission.  -Continue to hold home Entresto based on low blood pressures -Continue to hold home Lasix based on euvolemic status  FEN/GI: Regular diet  PPx: Heparin drip, pharm to dose/adjust rate  Disposition:  Status is: Inpatient   Remains inpatient appropriate because: IV treatments appropriate due to intensity of illness   Dispo: Patient From: Home Planned Disposition: IP rehabilitation   Expected discharge date: 07/28/2020 Medically stable for discharge: No  Subjective: Patient examined at bedside. Resting comfortably. No acute concerns, denies SOB, chest pain, nausea, vomiting. States he feels better than yesterday, no pain. Reports he wants to go to an inpatient rehab to get stronger.   Objective: Temp:  [97.6 F (36.4 C)-98.8 F (37.1 C)] 97.6 F (36.4 C) (02/24 0329) Pulse Rate:  [73-100] 73 (02/24 0329) Resp:  [12-18] 12 (02/24 0329) BP: (101-134)/(61-73) 101/66 (02/24 0329) SpO2:  [95 %-98 %] 98 % (02/24 0329)  Physical  Exam:  General: 60 y.o. male in NAD, resting comfortably.  Cardio: s1s2 noted, in atrial fibrillation; doppler revealed DP and PT pulses present on the left lower extremity, with only PT pulse present on the right lower extremity Lungs: clear lung fields bilaterally, no increased work of breathing on room air Abdomen: soft, nontender, nondistended, bowel sounds present Skin: Warm and dry Extremities: No edema noted. Left groin wound dressed with bandage, no erythema or bleeding from site.   Laboratory: Recent Labs  Lab 07/25/20 1617 07/26/20 0309 07/26/20 1326  WBC 18.6* 17.6* 18.4*  HGB 9.4* 8.9* 9.4*  HCT 28.9* 25.8* 27.3*  PLT 202 197 218   Recent Labs  Lab 07/22/20 2200 07/22/20 2212 07/25/20 1617 07/26/20 0309 07/27/20 0358  NA 134*   < > 134* 131* 131*  K 3.2*   < > 4.4 4.0 4.1  CL 100   < > 102 101 101  CO2 22   < > 21* 22 21*  BUN 13   < > 17 14 11   CREATININE 0.72   < > 0.77 0.79 0.65  CALCIUM 8.0*   < > 7.8* 7.6* 8.2*  PROT 5.5*  --   --   --   --   BILITOT 0.4  --   --   --   --   ALKPHOS 294*  --   --   --   --   ALT 28  --   --   --   --   AST 27  --   --   --   --   GLUCOSE 213*   < > 113* 129* 129*   < > = values in this interval not displayed.   Digoxin level (2/23): 0.5*  Imaging/Diagnostic Tests: LE ANGIO 07/25/2020 FINDINGS: 1. Submitted images demonstrate bilateral common iliac artery stents, with complete to near complete occlusion of the left common iliac artery stent. 2. Right common and external iliac  arteries are patent. Balloon plasty of the left common iliac artery stent is seen. Final angiogram demonstrates patency of the left common iliac artery with continued mild narrowing at the origin.  CT ANGIO AO+BIFEM W OR WO CONTRAST 07/24/2020 IMPRESSION: 1. Acute occlusion of the indwelling left common iliac stent. 2. Increased burden of mural thrombus in the distal abdominal aorta resulting in up to approximately 40% stenosis just proximal to  the indwelling kissing iliac stents. 3. Acute occlusion of the distal left common femoral artery extending into the proximal profunda and superficial femoral artery. 4. Embolic occlusion of the left proximal peroneal and distal anterior tibial arteries.  MR BRAIN W WO CONTRAST 07/23/2020 IMPRESSION:  1. Multiple acute right MCA territory infarcts, largest in the temporoparietal region.  2. Minimally increased size of 4 mm right frontal operculum metastasis.  3. Essentially unchanged size of other lesions with decreased vasogenic edema in the temporal lobes.  4. Unchanged left cerebral leptomeningeal enhancement.   CT Code Stroke CTA Head/Neck W/WO contrast 07/22/2020 IMPRESSION:  1. Occlusion of a distal right M3 branch in the inferior right parietal region. 9 mL core infarction with slight loss of gray-white differentiation at the inferior right parietal region. 19 CC region of at risk brain with a mismatch volume of 19 mL.  2. No carotid bifurcation disease. No visible aortic atherosclerosis.  3. Hilar and mediastinal lymphadenopathy consistent with the diagnosis of metastatic disease.  DG Chest Port 1 View 07/22/2020 IMPRESSION: No definite acute cardiopulmonary process.   CT HEAD CODE STROKE WO CONTRAST 07/22/2020 IMPRESSION:  1. Known bilateral temporal lobe metastatic disease. Subtle architectural distortion of the inferior temporal lobe on the right. Some volume loss and distortion of the inferior and anterior left temporal lobe. Other tiny metastases previously seen by MRI are not specifically appreciable. No acute finding by CT. 2. ASPECTS is 10.   Karle Plumber, Medical Student 07/27/2020, 7:26 AM Acting Intern, [place] Intern pager: [phone], text pages welcome  FPTS Upper-Level Resident Addendum   I have independently interviewed and examined the patient. I have discussed the above with the original author and agree with their documentation. My  edits for correction/addition/clarification are in blue. Please see also any attending notes.    Matilde Haymaker MD PGY-3, [facility] 07/27/2020 12:41 PM  [place] Service pager: [phone] (text pages welcome through [place])

## 2020-07-28 DIAGNOSIS — R29898 Other symptoms and signs involving the musculoskeletal system: Secondary | ICD-10-CM

## 2020-07-28 DIAGNOSIS — R531 Weakness: Secondary | ICD-10-CM

## 2020-07-28 DIAGNOSIS — Z419 Encounter for procedure for purposes other than remedying health state, unspecified: Secondary | ICD-10-CM

## 2020-07-28 DIAGNOSIS — D6859 Other primary thrombophilia: Secondary | ICD-10-CM

## 2020-07-28 LAB — CBC
HCT: 26.7 % — ABNORMAL LOW (ref 39.0–52.0)
Hemoglobin: 8.8 g/dL — ABNORMAL LOW (ref 13.0–17.0)
MCH: 29.6 pg (ref 26.0–34.0)
MCHC: 33 g/dL (ref 30.0–36.0)
MCV: 89.9 fL (ref 80.0–100.0)
Platelets: 258 10*3/uL (ref 150–400)
RBC: 2.97 MIL/uL — ABNORMAL LOW (ref 4.22–5.81)
RDW: 14.7 % (ref 11.5–15.5)
WBC: 19.2 10*3/uL — ABNORMAL HIGH (ref 4.0–10.5)
nRBC: 0 % (ref 0.0–0.2)

## 2020-07-28 LAB — CULTURE, BLOOD (ROUTINE X 2)
Culture: NO GROWTH
Special Requests: ADEQUATE

## 2020-07-28 LAB — HEPARIN LEVEL (UNFRACTIONATED)
Heparin Unfractionated: 0.37 IU/mL (ref 0.30–0.70)
Heparin Unfractionated: 0.47 IU/mL (ref 0.30–0.70)
Heparin Unfractionated: 0.39 IU/mL (ref 0.30–0.70)

## 2020-07-28 LAB — GLUCOSE, CAPILLARY
Glucose-Capillary: 91 mg/dL (ref 70–99)
Glucose-Capillary: 128 mg/dL — ABNORMAL HIGH (ref 70–99)
Glucose-Capillary: 160 mg/dL — ABNORMAL HIGH (ref 70–99)
Glucose-Capillary: 131 mg/dL — ABNORMAL HIGH (ref 70–99)

## 2020-07-28 LAB — BASIC METABOLIC PANEL
Anion gap: 9 (ref 5–15)
BUN: 13 mg/dL (ref 6–20)
CO2: 21 mmol/L — ABNORMAL LOW (ref 22–32)
Calcium: 8.2 mg/dL — ABNORMAL LOW (ref 8.9–10.3)
Chloride: 102 mmol/L (ref 98–111)
Creatinine, Ser: 0.62 mg/dL (ref 0.61–1.24)
GFR, Estimated: >60 mL/min (ref >60)
Glucose, Bld: 108 mg/dL — ABNORMAL HIGH (ref 70–99)
Potassium: 4.2 mmol/L (ref 3.5–5.1)
Sodium: 132 mmol/L — ABNORMAL LOW (ref 135–145)

## 2020-07-28 MED ORDER — ENOXAPARIN SODIUM 80 MG/0.8ML ~~LOC~~ SOLN
80.0000 mg | Freq: Two times a day (BID) | SUBCUTANEOUS | Status: DC
  Administered 2020-07-28 – 2020-07-30 (×4): 80 mg via SUBCUTANEOUS
  Filled 2020-07-28 (×4): qty 0.8

## 2020-07-28 NOTE — Progress Notes (Signed)
Occupational Therapy Treatment Patient Details Name: Adam Bender. MRN: [id number] DOB: April 06, 1961 Today's Date: 07/28/2020    History of present illness 60yo male presenting with acute CVA symptoms likely due to embolic event and related ischemia. Found to have multiple R MCA CVA acute infarcts. PMH metastatic malignant melanoma with brain mets, A-fib with recent venous and arterial clots s/p thrombectomy, hypercoagulable state due to high level cancer   OT comments  Patient seen this date for establishment of LUE fine/gross motor HEP.  Patient issued squeeze ball and thera-putty (yellow) and given tasks to complete, see HEP section below.  Patient and spouse demo good understanding of HEP, and patient able to demo exercises back to OT.  Patient issued red foam wrapped in blue foam to build up untensils and encourage use of L UE during meals.  CIR has been recommended, but the spouse and patient are planning for discharge home this Sunday.  Patient has needed DME at home, [facility] may be a nice transition to home.    Follow Up Recommendations    [facility] OT   Equipment Recommendations  None recommended by OT .  Has needed DME   Recommendations for Other Services      Precautions / Restrictions Precautions Precautions: Fall;Other (comment) Precaution Comments: L hemi (LUE>LLE), hypercoagulable 2/2 cancer Restrictions Weight Bearing Restrictions: Yes RLE Weight Bearing: Weight bearing as tolerated LLE Weight Bearing: Weight bearing as tolerated Other Position/Activity Restrictions: L hemiparesis                Vision       Perception     Praxis      Cognition Arousal/Alertness: Awake/alert Behavior During Therapy: WFL for tasks assessed/performed Overall Cognitive Status: Within Functional Limits for tasks assessed                                          Exercises Other Exercises Other Exercises: LUE AROM: elbow flex/ext, wrist flex/ext, finger gross  grasp/ext, shoulder flex/abd.  Emphasized quality of movement, and to not use accessory mm for compensatory movements. Other Exercises: Stacking of large cups in sitting with AA of R arm assisting L arm Other Exercises: Fine motor: picking 3 objects out of theraputty Other Exercises: Gross grasp and in hand manipulation L UE with theraputty Other Exercises: Chest press and lateral raise supine AA using R UE to help L UE.   Shoulder Instructions       General Comments      Pertinent Vitals/ Pain       Pain Score: 3  Pain Location: L axilla area to forward flexion of the shoulder Pain Descriptors / Indicators: Tender Pain Intervention(s): Monitored during session                                                          Frequency  Min 2X/week        Progress Toward Goals  OT Goals(current goals can now be found in the care plan section)  Progress towards OT goals: Progressing toward goals  Acute Rehab OT Goals Patient Stated Goal: be as independent as possible at home OT Goal Formulation: With patient Time For Goal Achievement: 2020-09-05 Potential to Achieve Goals:  Good  Plan Discharge plan remains appropriate    Co-evaluation                 AM-PAC OT "6 Clicks" Daily Activity     Outcome Measure   Help from another person eating meals?: A Little Help from another person taking care of personal grooming?: A Little Help from another person toileting, which includes using toliet, bedpan, or urinal?: A Little Help from another person bathing (including washing, rinsing, drying)?: A Lot Help from another person to put on and taking off regular upper body clothing?: A Little Help from another person to put on and taking off regular lower body clothing?: A Lot 6 Click Score: 16    End of Session    OT Visit Diagnosis: Unsteadiness on feet (R26.81);Other abnormalities of gait and mobility (R26.89);Muscle weakness (generalized)  (M62.81);Hemiplegia and hemiparesis;Pain Hemiplegia - Right/Left: Left Hemiplegia - dominant/non-dominant: Non-Dominant Hemiplegia - caused by: Cerebral infarction Pain - Right/Left: Left Pain - part of body: Arm   Activity Tolerance Patient tolerated treatment well   Patient Left in bed;with family/visitor present;with call bell/phone within reach   Nurse Communication Mobility status        Time: 3734-2876 OT Time Calculation (min): 21 min  Charges: OT General Charges $OT Visit: 1 Visit OT Treatments $Therapeutic Exercise: 8-22 mins  07/28/2020  Rich, OTR/L  Acute Rehabilitation Services  Office:  [phone]    Metta Clines 07/28/2020, 3:07 PM

## 2020-07-28 NOTE — Progress Notes (Addendum)
Daily Progress Note   Patient Name: Adam Bender.       Date: 07/28/2020 DOB: Oct 09, 1960  Age: 60 y.o. MRN#: [id number] Attending Physician: McDiarmid, Blane Ohara, MD Primary Care Physician: Patient, No Pcp Per Admit Date: 07/22/2020  Reason for Follow-up: continued GOC discussion, psychosocial support  Subjective: Patient is status post left iliac and femoral thrombectomy, and left common iliac stent on 2/22. He remains on heparin infusion.    He is resting comfortably in bed. He does report mild pain in the right foot, but per nursing this has been controlled with current pain medication regimen. His mother is at bedside. We discussed that he seems to be doing relatively well post-op, and was able to work with PT yesterday. Per PT notes, he had "good participation and tolerance for mobility" with improved LUE mobility compared to previous session. Per patient and mother, short-term goal of care is CIR to regain functional mobility. Their back up plan is SNF for rehab. The long-term goal of care is to return home and continue cancer treatment.   Provided education and counseling regarding hypercoagulable state secondary to malignancy. Explained that he continues to be at risk for developing clots due to having cancer. Emphasized that this risk can't be eliminated, even with blood thinners. Provided education that blood thinners also have risks, most concerning is the risk for bleeding in the brain. Patient verbalized understanding.   Length of Stay: 5  Current Medications: Scheduled Meds:  . acetaminophen  1,000 mg Oral Q6H  . amiodarone  200 mg Oral Daily  . aspirin  81 mg Oral Daily  . Chlorhexidine Gluconate Cloth  6 each Topical Daily  . dexamethasone  2 mg Oral Daily   Followed by   . [START ON 07/31/2020] dexamethasone  1 mg Oral Daily  . digoxin  0.0625 mg Oral Daily  . diltiazem  180 mg Oral Daily  . gabapentin  100 mg Oral TID  . insulin aspart  0-9 Units Subcutaneous TID WC  . lidocaine  1 patch Transdermal Daily  . melatonin  3 mg Oral QHS  . metoprolol tartrate  50 mg Oral BID  . sodium chloride flush  3 mL Intravenous Once    Continuous Infusions: . sodium chloride 10 mL/hr at 07/26/20 0020  . heparin 1,850 Units/hr (07/27/20  2026)    PRN Meds: cyclobenzaprine, HYDROmorphone (DILAUDID) injection, ondansetron, oxyCODONE, senna-docusate  Physical Exam Vitals reviewed.  Constitutional:      General: He is not in acute distress.    Appearance: He is ill-appearing.  Cardiovascular:     Rate and Rhythm: Normal rate. Rhythm irregularly irregular.     Comments: A-fib Pulmonary:     Effort: Pulmonary effort is normal.  Skin:    Comments: Wound vac to RLE  Neurological:     Mental Status: He is alert and oriented to person, place, and time.             Vital Signs: BP 114/70 (BP Location: Right Arm)   Pulse 67   Temp 98.3 F (36.8 C) (Axillary)   Resp 16   Ht 5' 2.99" (1.6 m)   Wt 73.7 kg   SpO2 95%   BMI 28.79 kg/m  SpO2: SpO2: 95 % O2 Device: O2 Device: Room Air O2 Flow Rate:    Intake/output summary:   Intake/Output Summary (Last 24 hours) at 07/28/2020 1013 Last data filed at 07/28/2020 0500 Gross per 24 hour  Intake --  Output 1245 ml  Net -1245 ml   LBM: Last BM Date: 07/26/20 Baseline Weight: Weight: 73.7 kg Most recent weight: Weight: 73.7 kg       Palliative Assessment/Data: PPS 40-50%      Palliative Care Assessment & Plan   HPI/Patient Profile:59 y.o.malewith past medical history of atrial fibrillation, pulmonary embolism, SVC thrombus,right iliac artery embolism status post thrombectomy and stent placement, CHF, recent diagnosis of malignant melanoma with brain metastasis presented to the ED on 07/22/20 from  home with wife's concerns of patient's facial droop, slurred speech, and no left arm movement or sensation. Code Stroke was initiated on arrival to ED and CTA/CTP showed right M3 branch occlusion and corresponding ischemia.[facility] with left sided weakness and metastatic melanoma.   Patient was hospitalized at [facility] 2/1--2/17 and underwent right iliac thrombectomy with fasciotomy on 2/1. Discharged on lovenox and with a wound vac.  Assessment: - acute CVA - metastatic melanoma - brain metastasis - Atrial fibrillation with RVR - acute on chronic anemia - right lower extremity wound, s/p fasciotomy - superior vena cava thrombus - hyponatremia - hypoalbuminemia - s/p left iliac and femoral thrombectomy, and left common iliac stent  - hypercoagulable state secondary to malignancy  Recommendations/Plan: Goals are clear for full scope treatment Short-term goal is CIR to regain functional mobility Long-term goal is to return home and continue cancer treatment Continue current medical care Patient educated on continued high risk for clots secondary to cancer - he verbalizes understanding Patient and wife have previously verbalized understanding that goal of cancer treatment is palliative , not curative PMT will continue to follow and check-in intermittently, please call [phone] for urgent needs  Goals of Care and Additional Recommendations: Limitations on Scope of Treatment: full scope treatment  Code Status: full  Prognosis:  poor overall   Discharge Planning: CIR versus SNF for rehab  Care plan was discussed with Dr. Pilar Plate  Thank you for allowing the Palliative Medicine Team to assist in the care of this patient.   Total Time 25 minutes Prolonged Time Billed  no      Greater than 50%  of this time was spent counseling and coordinating care related to the above assessment and plan.  Lavena Bullion, NP  Please contact  Palliative Medicine Team phone at [phone] for questions  and concerns.

## 2020-07-28 NOTE — Progress Notes (Signed)
ANTICOAGULATION CONSULT NOTE - Follow Up Consult  Pharmacy Consult for heparin > switch to Lovenox Indication: atrial fibrillation, recent venous and arterial clot, and acute stroke  Labs: Recent Labs    07/26/20 0309 07/26/20 1149 07/26/20 1326 07/26/20 2023 07/27/20 0358 07/27/20 0910 07/27/20 1144 07/28/20 0053 07/28/20 0802 07/28/20 1343  HGB 8.9*  --  9.4*  --   --  9.0*  --  8.8*  --   --   HCT 25.8*  --  27.3*  --   --  27.1*  --  26.7*  --   --   PLT 197  --  218  --   --  225  --  258  --   --   HEPARINUNFRC 0.16*   < >  --    < > 0.26*  --    < > 0.37 0.47 0.39  CREATININE 0.79  --   --   --  0.65  --   --  0.62  --   --    < > = values in this interval not displayed.    Assessment: 60yo male with acute CVA. PMH s/f PFO, cancer, atrial fibrillation on lovenox PTA, recent venous and arterial clots, s/p R iliac thrombectomy with fasciotomy at [facility] on 07/04/20.   No s/sx bleeding noted. PLT stable, Hgb trending down slightly to 8.8 today.   Pharmacy asked to switch heparin to lovenox today.  Prior to admission was on slightly less than 1 mg/kg q 12 hrs when presented with new clots.  Per discussion with MD, will round dose to 80 mg q 12 hrs, which will be slightly more than 1 mg/kg (current weight 73.7kg)  Goal of Therapy:  LMWH level 4 hrs after dose 0.6-1.2  Plan:  Stop IV heparin at 9 pm. Give lovenox 80 mg sq q 12hrs at that time. CBC q 72 hrs LMWH level at steady state prior to discharge if feasible.  [place] Crane, Roylene Reason, BCCP Clinical Pharmacist  07/28/2020 5:34 PM   [facility] pharmacy phone numbers are listed on [email or web address]

## 2020-07-28 NOTE — Progress Notes (Addendum)
Daily Progress Note   Patient Name: Adam Bender.       Date: 07/28/2020 DOB: [birth year]  Age: 60 y.o. MRN#: [id number] Attending Physician: McDiarmid, Blane Ohara, MD Primary Care Physician: Patient, No Pcp Per Admit Date: 07/22/2020  Reason for Consultation/Follow-up: Establishing goals of care  Subjective: PMT was notified earlier that patient and patient's wife had concerns around his plan of care for anticoagulation on discharge - also notified they are considering discharging home vs CIR.   Chart review performed. Received report from primary RN - no acute concerns. RN reports patient is eating and drinking well today. RN states interdisciplinary team had meeting with patient and wife earlier today to address concerns.   Spoke with Teaching Service and received updates about their meeting with patient today. Plan is for discharge in 1-2 days with supratherapeutic dose of Lovenox as suggested by oncology.   Went to visit patient at bedside - wife/[name] is present. Patient is lying in bed awake, alert, oriented, and able to participate in conversation. Patient denies pain. No signs or non-verbal gestures of pain or discomfort noted. No respiratory distress, increased work of breathing, or secretions noted. Patient states he is feeling better today than he has in several days.   [name] shares with me the information she received during the interdisciplinary meeting today - she states they answered all her questions and addressed all her concerns. I reviewed goals with [name] and patient - [name] states they have decided instead of going to CIR they would like the patient to discharge home. We reviewed that, even with increased Lovenox, the patient remains at high risk for recurrent blood clots;  as well as reviewed that blood thinners also have risk, specifically brain bleed in patient's situation. Patient and [name] express understanding of risks. [name] states, "we may get home and have to come right back on Wednesday." Validated that patient is at high risk of rehospitalization. The ultimate goal is for patient to return home and continue to follow with oncology outpatient, where they have been approved to start a new oral medication.   [name] requests that when patient is discharged all appropriate orders for PT and WON are given/placed - therapeutic listening provided as she reflected on their discharge from [facility], when home health RN arrived to their home and did  not have appropriate orders for care.  All questions and concerns addressed. Encouraged to call with questions and/or concerns. PMT card previously provided.   Length of Stay: 5  Current Medications: Scheduled Meds:  . acetaminophen  1,000 mg Oral Q6H  . amiodarone  200 mg Oral Daily  . aspirin  81 mg Oral Daily  . Chlorhexidine Gluconate Cloth  6 each Topical Daily  . dexamethasone  2 mg Oral Daily   Followed by  . [START ON 07/31/2020] dexamethasone  1 mg Oral Daily  . digoxin  0.0625 mg Oral Daily  . diltiazem  180 mg Oral Daily  . gabapentin  100 mg Oral TID  . insulin aspart  0-9 Units Subcutaneous TID WC  . lidocaine  1 patch Transdermal Daily  . melatonin  3 mg Oral QHS  . metoprolol tartrate  50 mg Oral BID  . sodium chloride flush  3 mL Intravenous Once    Continuous Infusions: . sodium chloride 10 mL/hr at 07/26/20 0020  . heparin 1,850 Units/hr (07/27/20 2026)    PRN Meds: cyclobenzaprine, HYDROmorphone (DILAUDID) injection, ondansetron, oxyCODONE, senna-docusate  Physical Exam Vitals and nursing note reviewed.  Constitutional:      General: He is not in acute distress.    Appearance: He is ill-appearing.  Pulmonary:     Effort: No respiratory distress.  Skin:    General: Skin is warm and dry.      Comments: Wound vac  Neurological:     Mental Status: He is alert and oriented to person, place, and time.     Motor: Weakness present.  Psychiatric:        Attention and Perception: Attention normal.        Behavior: Behavior is cooperative.        Cognition and Memory: Cognition and memory normal.             Vital Signs: BP 116/71 (BP Location: Right Arm)   Pulse 81   Temp 97.6 F (36.4 C) (Oral)   Resp 16   Ht 5' 2.99" (1.6 m)   Wt 73.7 kg   SpO2 98%   BMI 28.79 kg/m  SpO2: SpO2: 98 % O2 Device: O2 Device: Room Air O2 Flow Rate:    Intake/output summary:   Intake/Output Summary (Last 24 hours) at 07/28/2020 1456 Last data filed at 07/28/2020 1300 Gross per 24 hour  Intake 480 ml  Output 1245 ml  Net -765 ml   LBM: Last BM Date: 07/26/20 Baseline Weight: Weight: 73.7 kg Most recent weight: Weight: 73.7 kg       Palliative Assessment/Data: PPS 40-50%    Flowsheet Rows   Flowsheet Row Most Recent Value  Intake Tab   Referral Department Hospitalist  Unit at Time of Referral ER  Palliative Care Primary Diagnosis Cancer  Date Notified 07/23/20  Palliative Care Type New Palliative care  Reason for referral Clarify Goals of Care  Date of Admission 07/22/20  Date first seen by Palliative Care 07/23/20  # of days Palliative referral response time 0 Day(s)  # of days IP prior to Palliative referral 1  Clinical Assessment   Psychosocial & Spiritual Assessment   Palliative Care Outcomes   Patient/Family meeting held? Yes  Who was at the meeting? patient, wife  Palliative Care Outcomes Clarified goals of care, Counseled regarding hospice, Provided psychosocial or spiritual support      Patient Active Problem List   Diagnosis Date Noted  . Chronic combined systolic and diastolic  congestive heart failure ([place])   . Stroke ([place]) 07/23/2020  . 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 Left arm  numbness   . Left arm weakness   . Nausea with vomiting 06/29/2020  . Swollen lymph nodes 06/26/2020  . Pulmonary nodules 05/10/2020  . Atrial fibrillation with rapid ventricular response ([place]) 02/29/2020  . Depressed left ventricular ejection fraction 02/29/2020  . Malignant melanoma ([place]) 02/20/2020  . Brain metastases ([place]) 01/11/2020  . Skin, metastatic cancer to [facility]) 01/11/2020    Palliative Care Assessment & Plan   Patient Profile: 60 y.o.malewith past medical history of atrial fibrillation, pulmonary embolism, SVC thrombus,right iliac artery embolism status post thrombectomy and stent placement, CHF, recent diagnosis of malignant melanoma with brain metastasis presented to the ED on 07/22/20 from home with wife's concerns of patient's facial droop, slurred speech, and no left arm movement or sensation. Code Stroke was initiated on arrival to ED and CTA/CTP showed right M3 branch occlusionand corresponding ischemia.Patient wasadmittedto Family Medicine TSwith CVA with left sided weakness and metastatic melanoma.  Patient was hospitalized at [facility] 2/1--2/17 and underwent right iliac thrombectomy with fasciotomy on 2/1. Discharged on lovenox and with a wound vac.  Assessment: Acute CVA Metastatic melanoma Brain metastasis Atrial fibrillation with RVR Acute on chronic anemia Right lower extremity wound, s/p fasciotomy Superior vena cava thrombus Hyponatremia Hypoalbuminemia S/p left iliac and femoral thrombectomy, and left common iliac stent  Hypercoagulable state secondary to malignancy  Recommendations/Plan: Continue current medical treatment Continue full code status Goals are clear: discharge home with outpatient Palliative Care to follow, patient wants to follow up outpatient with oncology On discharge, please ensure appropriate orders are placed for all home health services (WON, PT) per family request (has had issue in the past where no orders were  given) Patient continues to be appropriate for hospice services if/when interested in transition to comfort measures PMT will continue to follow peripherally. If there are any imminent needs please call the service directly   Goals of Care and Additional Recommendations: Limitations on Scope of Treatment: Full Scope Treatment  Code Status:    Code Status Orders  (From admission, onward)         Start     Ordered   07/23/20 0736  Full code  Continuous        07/23/20 0735        Code Status History    Date Active Date Inactive Code Status Order ID Comments User Context   07/23/2020 0729 07/23/2020 0735 Full Code [id number]  Gladys Damme, MD ED   Advance Care Planning Activity      Prognosis:  poor in the setting of malignant melanoma with brain mets and hypercoagulable state    Discharge Planning: Home with [place] was discussed with primary RN, patient, patient's wife, Teaching Service  Thank you for allowing the Palliative Medicine Team to assist in the care of this patient.   Total Time 35 minutes Prolonged Time Billed  no       Greater than 50%  of this time was spent counseling and coordinating care related to the above assessment and plan.  Lin Landsman, NP  Please contact Palliative Medicine Team phone at [phone] for questions and concerns.

## 2020-07-28 NOTE — Progress Notes (Signed)
Inpatient Rehabilitation Admissions Coordinator  I met with patient and his wife at bedside. Wife informs me that she would like to speak to his medical team to further discuss her concerns with his continued clots that Lovenox has not been able to prevent. She does not want to pursue CIR admit until she clarifies with his medical team his clotting issues and his medical treatment to prevent further clots. She may want to take him directly home. I explained that I would inform his acute team and hold pursuing admit to CIR through the weekend until she gets clarity on his goals moving forward. I spoke with his Attending service on speaker phone per their request to convey wife request. I have alerted TOC and palliative team and will follow up on Monday.  Danne Baxter, RN, MSN Rehab Admissions Coordinator [phone] 07/28/2020 11:42 AM

## 2020-07-28 NOTE — Progress Notes (Signed)
Physical Therapy Treatment Patient Details Name: Adam Bender. MRN: [id number] DOB: 12-13-1960 Today's Date: 07/28/2020    History of Present Illness 60yo male presenting with acute CVA symptoms likely due to embolic event and related ischemia. Found to have multiple R MCA CVA acute infarcts. PMH metastatic malignant melanoma with brain mets, A-fib with recent venous and arterial clots s/p thrombectomy, hypercoagulable state due to high level cancer.    PT Comments    Pt received in supine, agreeable to therapy session with encouragement, reporting fatigue after recent stand pivot transfer to/from [facility], spouse present and supportive. Pt performed bed mobility and transfers with up to +1 minA and progressed gait distance to 52ft using RW and up to +2 minA, second person mostly present to manage lines. Reviewed supine/seated/standing BLE exercises and HEP handout printed for reinforcement. Also reviewed stair sequencing and caregiver assist for gait/stairs, pt spouse has gait belt in room and reports they have all DME they need aside from L hand holster to assist with maintaining grip on RW. Pt continues to benefit from PT services to progress toward functional mobility goals. Continue to recommend CIR, if CIR unavailable pt would benefit from HHPT.  Follow Up Recommendations  CIR;Supervision for mobility/OOB     Equipment Recommendations  Rolling walker with 5" wheels;3in1 (PT);Wheelchair (measurements PT);Wheelchair cushion (measurements PT)    Recommendations for Other Services       Precautions / Restrictions Precautions Precautions: Fall;Other (comment) Precaution Comments: L hemi (LUE>LLE), hypercoagulable 2/2 cancer Restrictions Weight Bearing Restrictions: Yes RLE Weight Bearing: Weight bearing as tolerated LLE Weight Bearing: Weight bearing as tolerated Other Position/Activity Restrictions: L hemiparesis    Mobility  Bed Mobility Overal bed mobility: Needs  Assistance Bed Mobility: Supine to Sit     Supine to sit: Min assist Sit to supine: Min guard   General bed mobility comments: HOB flat, did not need rail, minA for trunk rise    Transfers Overall transfer level: Needs assistance Equipment used: Rolling walker (2 wheeled) Transfers: Sit to/from Stand Sit to Stand: Min guard;+2 safety/equipment         General transfer comment: min guard and cues for hand placement, light manual assist to maintain LUE grip  Ambulation/Gait Ambulation/Gait assistance: Min assist;+2 safety/equipment Gait Distance (Feet): 25 Feet (10+27ft forward/retro steps, seated break, then 86ft) Assistive device: Rolling walker (2 wheeled) Gait Pattern/deviations: Step-through pattern;Decreased stride length Gait velocity: decreased; grossly <0.3 m/s Gait velocity interpretation: <1.31 ft/sec, indicative of household ambulator General Gait Details: minA at most for stability with backward steps, only with light LUE grip on RW so assist to maintain, mostly min guard for forward stepping; 2nd staff member present to assist with lines/for safety   Stairs             Wheelchair Mobility    Modified [name] (Stroke Patients Only)       Balance Overall balance assessment: Needs assistance Sitting-balance support: Feet supported;No upper extremity supported Sitting balance-Leahy Scale: Fair Sitting balance - Comments: no LOB   Standing balance support: Bilateral upper extremity supported;During functional activity Standing balance-Leahy Scale: Poor Standing balance comment: Reliant on at least unilateral UE support to maintain standing balance, improved RW grip on LUE but minA to maintain at times                            Cognition Arousal/Alertness: Awake/alert Behavior During Therapy: WFL for tasks assessed/performed Overall Cognitive Status: Within Functional Limits  for tasks assessed                                  General Comments: Answered questions and followed cues appropriately, A&Ox4      Exercises Other Exercises Other Exercises: reviewed HEP handout Other Exercises: Stacking of large cups in sitting with AA of R arm assisting L arm Other Exercises: Fine motor: picking 3 objects out of theraputty Other Exercises: Gross grasp and in hand manipulation L UE with theraputty Other Exercises: Chest press and lateral raise supine AA using R UE to help L UE.    General Comments General comments (skin integrity, edema, etc.): spouse present; brother leaving room at beginning of session      Pertinent Vitals/Pain Pain Assessment: 0-10 Pain Score: 6  Pain Location: L axilla area to forward flexion of the shoulder and R foot with WB Pain Descriptors / Indicators: Tender;Numbness ("cold feet") Pain Intervention(s): Monitored during session;Repositioned;Heat applied;Patient requesting pain meds-RN notified (warm blankets to B feet once back in supine)    Home Living                      Prior Function            PT Goals (current goals can now be found in the care plan section) Acute Rehab PT Goals Patient Stated Goal: be as independent as possible at home PT Goal Formulation: With patient/family Time For Goal Achievement: 08/07/20 Potential to Achieve Goals: Good    Frequency    Min 3X/week      PT Plan Current plan remains appropriate    Co-evaluation              AM-PAC PT "6 Clicks" Mobility   Outcome Measure  Help needed turning from your back to your side while in a flat bed without using bedrails?: A Little Help needed moving from lying on your back to sitting on the side of a flat bed without using bedrails?: A Little Help needed moving to and from a bed to a chair (including a wheelchair)?: A Little Help needed standing up from a chair using your arms (e.g., wheelchair or bedside chair)?: A Little Help needed to walk in hospital room?: A Lot Help needed  climbing 3-5 steps with a railing? : Total 6 Click Score: 15    End of Session Equipment Utilized During Treatment: Gait belt Activity Tolerance: Patient tolerated treatment well;Other (comment) (HR elevated to 125 bpm) Patient left: in chair;with call bell/phone within reach;with chair alarm set;with family/visitor present (heels floated) Nurse Communication: Mobility status PT Visit Diagnosis: Unsteadiness on feet (R26.81);Muscle weakness (generalized) (M62.81);Other symptoms and signs involving the nervous system (R29.898);Difficulty in walking, not elsewhere classified (R26.2)     Time: 8177-1165 PT Time Calculation (min) (ACUTE ONLY): 23 min  Charges:  $Gait Training: 8-22 mins $Therapeutic Exercise: 8-22 mins                     Araminta Zorn P., PTA Acute Rehabilitation Services Pager: [phone] Office: [place] 07/28/2020, 4:42 PM

## 2020-07-28 NOTE — Progress Notes (Signed)
After discussion with the patient, his wife, pharmacy [facility]) and the family medicine teaching service, we decided to begin his transition from heparin to Lovenox.  We are following oncology recommendations and moving forward with Lovenox 80 mg twice daily.  We will plan for a Lovenox level after 3 doses (likely the evening of 2/26) to ensure that he appropriately anticoagulated.    Matilde Haymaker, MD

## 2020-07-28 NOTE — Progress Notes (Signed)
Family Medicine Teaching Service Daily Progress Note Intern Pager: [phone]  Patient name: Adam Bender. Medical record number: [id number] Date of birth: [birth year] Age: 60 y.o. Gender: male  Primary Care Provider: Patient, No Pcp Per Consultants: Neurology, hematology-oncology, vascular surgery  Code Status: FULL  Pt Overview and Major Events to Date:  Admitted 2/19: CVA work-up 2/21: Acute worsening of right foot pain, CTA showing left lower extremity clots 2/22: Left embolectomy with fasciotomy  Assessment and Plan: Adam Benderis a 60 y.o.malepresenting with slurred speech, left-sided facial droop, left-sided weakness found to have CVA and progression of metastatic disease.PMH is significant formetastatic malignant melanoma, brain metastases, atrial fibrillation with recent venous and arterial clot s/p right lower extremity thrombectomy and fasciotomy on 07/04/2020 and repeat thrombectomy on 07/07/2020.  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 Per vascular surgery note this am, brisk biphasic left PT doppler signal and monophasic right PT doppler signal. Stable for discharge. -Monitor pain  -Dr. Hinton Rao w/ oncology has previously recommended Lovenox 80mg  BID with ASA 81mg  qd, plan to discharge on this regimen  -Continue heparin drip, goal level 0.3-0.5, rate per pharmacy (increased to 1850u/hr yesterday, level 0.37)  Metastatic Melanomawith Brain, Lung, Axillary Involvement Follows with Dr. Hinton Rao.On steroid taper, see below. CTA performed earlier this admission demonstrated significant progression of lung masses, axillary mass and potential new kidney metastasis. -Decadron taper: 2 mg x7 days (2/21-2/27), 1 mg x7days (2/28-3/6) -Hematology-oncology recs: immunotherapy treatment options plus radiation, but treatment is palliative and  not curative  Goals ofCare Patient and his wife understand the poor prognosis of Mr. Adam Bender's disease. He has significant clot burden in relation to his metastatic cancer. Palliative care is meeting with patient and family regularly (unrestricted visitor access), and all teams on board have discussed goals of care with them, appreciate all recommendations. -Palliative care will continue to follow in the outpatient setting  Normocytic Anemia(Transfusion Threshold: 8) Admission Hgb 8.7.Likely in the setting of recent vascular procedure and known metastatic cancer.Blood streaked stool noted 2/23, patient asymptomatic. Hgb stable at 8.8. No obvious sides of bleeding. -Daily CBC and monitor for signs and symptoms of bleeding  Left-Sided Weakness 2/2 Acute CVA Speech improved, weaknessof LUE persistent.Sensation in tact. -Heparin gtt, rate per pharmacy -PT recs CIR, rehab admissions coordinator on board, insurance approval in process -Family has decided against CIR placement and wishes to take patient home if Lovenox can be monitored for therapeutic range.  Atrial Fibrillation Follows with Dr. Roetta Sessions.In atrial fibrillationon exam but denies symptoms.Due to hypercoaguable state 2/2 cancer diagnosis, anticoagulation has been an ongoing challenge and patient continues to have systemic clotting. -Continue home rate and rhythm control -Anticoagulation: currently on heparin drip, will plan to continue heparin dripfor entire admission. -Outpatient anticoagulation therapy likely Lovenox 80mg  BID and ASA 81mg  qd   Positive Blood Culture (1/4 bottles growing Staphylococcus epidermidis) Likely contaminant due to only one bottle positive and took 3 days to grow. Additionally, patient's vitals have remained stable, afebrile, leukocytosis in the setting of steroids improving. However, this patient is considered high risk due to malignancy and multiple hospitalizations and  surgeries. -Monitor vitals and CBC -Low threshold to repeat blood cultures  Hyperglycemiain the Setting of No Diabetes Mellitus Diagnosis (A1c: 6.4%) Likely due to steroid administration. Sugars ranged from 91-145 over last 24 hours. 3u aspart given. -sSSI aspart, no basal at this time  Leukocytosis, stable Admission WBC 24.6>19.2.  Likely in the setting of known metastatic melanoma and current steroid use.  HFrEF, Previous EF 50% in 2021 Echo 06/17/20, EF 16-10%, diastolic parameters indeterminate.Euvolemic since admission. -Continue to hold home Entresto based on low blood pressures -Continue to hold home Lasix based on euvolemic status  FEN/GI:Regular diet PPx: Heparin drip, pharm to dose/adjust rate  Disposition: Status is: Inpatient   Remains inpatient appropriate because: IV treatments appropriate due to intensity of illness   Dispo: Patient From: Home Planned Disposition: IP rehabilitation  Expected discharge date:07/28/2020 Medically stable for discharge: No  Subjective:  Patient seen and examined at bedside. Resting comfortably. No acute concerns. Reports arm weakness has improved since yesterday and is ready to go home.   Objective: Temp:  [97.6 F (36.4 C)-98.8 F (37.1 C)] 97.9 F (36.6 C) (02/25 0330) Pulse Rate:  [62-83] 78 (02/25 0400) Resp:  [13-20] 16 (02/25 0330) BP: (104-118)/(66-76) 110/69 (02/25 0330) SpO2:  [96 %-99 %] 96 % (02/25 0330)  Physical Exam: General: Pleasant, no acute distress, comfortable Cardiovascular: Irregularly irregular rhythm, peripheral pulses in tact Respiratory: no increased work of breathing on room air Abdomen: soft, nondistended, nontender. Bowel sounds present. Extremities: No edema. Groin wounds bilaterally are clean and dry, nontender.   Laboratory: Recent Labs  Lab 07/26/20 1326 07/27/20 0910 07/28/20 0053  WBC 18.4* 16.8* 19.2*  HGB 9.4* 9.0* 8.8*   HCT 27.3* 27.1* 26.7*  PLT 218 225 258   Recent Labs  Lab 07/22/20 2200 07/22/20 2212 07/26/20 0309 07/27/20 0358 07/28/20 0053  NA 134*   < > 131* 131* 132*  K 3.2*   < > 4.0 4.1 4.2  CL 100   < > 101 101 102  CO2 22   < > 22 21* 21*  BUN 13   < > 14 11 13   CREATININE 0.72   < > 0.79 0.65 0.62  CALCIUM 8.0*   < > 7.6* 8.2* 8.2*  PROT 5.5*  --   --   --   --   BILITOT 0.4  --   --   --   --   ALKPHOS 294*  --   --   --   --   ALT 28  --   --   --   --   AST 27  --   --   --   --   GLUCOSE 213*   < > 129* 129* 108*   < > = values in this interval not displayed.    Imaging/Diagnostic Tests: LE ANGIO 07/25/2020 FINDINGS: 1.Submitted images demonstrate bilateral common iliac artery stents, with complete to near complete occlusion of the left common iliac artery stent. 2.Right common and external iliac arteries are patent. Balloon plasty of the left common iliac artery stent is seen. Final angiogram demonstrates patency of the left common iliac artery with continued mild narrowing at the origin.  CT ANGIO AO+BIFEM W OR WO CONTRAST 07/24/2020 IMPRESSION: 1. Acute occlusion of the indwelling left common iliac stent. 2. Increased burden of mural thrombus in the distal abdominal aorta resulting in up to approximately 40% stenosis just proximal to the indwelling kissing iliac stents. 3. Acute occlusion of the distal left common femoral artery extending into the proximal profunda and superficial femoral artery. 4. Embolic occlusion of the left proximal peroneal and distal anterior tibial arteries.  MR BRAIN W WO CONTRAST 07/23/2020 IMPRESSION:  1. Multiple acute right MCA territory infarcts, largest in the temporoparietal region.  2. Minimally increased size of 4 mm  right frontal operculum metastasis.  3. Essentially unchanged size of other lesions with decreased vasogenic edema in the temporal lobes.  4. Unchanged left cerebral leptomeningeal enhancement.   CT Code Stroke CTA  Head/Neck W/WO contrast 07/22/2020 IMPRESSION:  1. Occlusion of a distal right M3 branch in the inferior right parietal region. 9 mL core infarction with slight loss of gray-white differentiation at the inferior right parietal region. 19 CC region of at risk brain with a mismatch volume of 19 mL.  2. No carotid bifurcation disease. No visible aortic atherosclerosis.  3. Hilar and mediastinal lymphadenopathy consistent with the diagnosis of metastatic disease.  DG Chest Port 1 View 07/22/2020 IMPRESSION: No definite acute cardiopulmonary process.   CT HEAD CODE STROKE WO CONTRAST 07/22/2020 IMPRESSION:  1. Known bilateral temporal lobe metastatic disease. Subtle architectural distortion of the inferior temporal lobe on the right. Some volume loss and distortion of the inferior and anterior left temporal lobe. Other tiny metastases previously seen by MRI are not specifically appreciable. No acute finding by CT. 2. ASPECTS is 10.  Karle Plumber, Medical Student 07/28/2020, 7:58 AM Acting Intern, [place] Intern pager: [phone], text pages welcome  FPTS Upper-Level Resident Addendum   I have independently interviewed and examined the patient. I have discussed the above with the original author and agree with their documentation. Please see also any attending notes.   Carollee Leitz MD PGY-2, [facility] 07/28/2020 2:51 PM  FPTS Service pager: [phone] (text pages welcome through [facility])

## 2020-07-28 NOTE — Progress Notes (Signed)
ANTICOAGULATION CONSULT NOTE - Follow Up Consult  Pharmacy Consult for heparin Indication: atrial fibrillation, recent venous and arterial clot, and acute stroke  Labs: Recent Labs    07/26/20 0309 07/26/20 1149 07/26/20 1326 07/26/20 2023 07/27/20 0358 07/27/20 0910 07/27/20 1144 07/27/20 1925 07/28/20 0053  HGB 8.9*  --  9.4*  --   --  9.0*  --   --  8.8*  HCT 25.8*  --  27.3*  --   --  27.1*  --   --  26.7*  PLT 197  --  218  --   --  225  --   --  258  HEPARINUNFRC 0.16*   < >  --    < > 0.26*  --  0.25* 0.28* 0.37  CREATININE 0.79  --   --   --  0.65  --   --   --  0.62   < > = values in this interval not displayed.    Assessment: 60yo male with acute CVA. PMH s/f PFO, cancer, atrial fibrillation on lovenox PTA, recent venous and arterial clots, s/p R iliac thrombectomy with fasciotomy at [facility] on 07/04/20. Pharmacy has been consulted for heparin with a lower goal rate of 0.3-0.5 units/mL.    Heparin level remains subtherapeutic at 0.28,  Slight increase upward after heparin infusion rate was increased to 1750 units/hr.   Of note, yesterday 2/23 RN reported scant red blood in stool, otherwise no bleeding reported. CBC trended down this morning 2/24, received 2u PRBCs 2/22.  Hgb 9 today.  PLTC wnl. Heparin level 0.37 units/ml Hg 8.8, PTLC 258  Goal of Therapy:  Heparin level 0.3-0.5 units/ml   Plan:  Continue heparin gtt at 1850 units/hr Follow daily CBC and heparin level Thanks for allowing pharmacy to be a part of this patient's care.  Excell Seltzer, PharmD Clinical Pharmacist 07/28/2020 2:45 AM

## 2020-07-28 NOTE — Progress Notes (Signed)
  Progress Note    07/28/2020 7:28 AM 3 Days Post-Op  Subjective:  Sleeping-wakes easily  afebrile  Vitals:   07/28/20 0330 07/28/20 0400  BP: 110/69   Pulse: 73 78  Resp: 16   Temp: 97.9 F (36.6 C)   SpO2: 96%     Physical Exam: General: sleeping and wakes easily; no distress Cardiac:  irregular Lungs:  Non labored Incisions:  Bilateral groins clean; wound vac in place right leg with good seal Extremities:  brisk biphasic left PT doppler signal and monophasic right PT doppler signal; motor and sensation are in tact.    CBC    Component Value Date/Time   WBC 19.2 (H) 07/28/2020 0053   RBC 2.97 (L) 07/28/2020 0053   HGB 8.8 (L) 07/28/2020 0053   HGB 15.2 04/14/2020 1446   HCT 26.7 (L) 07/28/2020 0053   PLT 258 07/28/2020 0053   PLT 363 04/14/2020 1446   MCV 89.9 07/28/2020 0053   MCH 29.6 07/28/2020 0053   MCHC 33.0 07/28/2020 0053   RDW 14.7 07/28/2020 0053   LYMPHSABS 1.4 07/22/2020 2200   MONOABS 0.7 07/22/2020 2200   EOSABS 0.0 07/22/2020 2200   BASOSABS 0.0 07/22/2020 2200    BMET    Component Value Date/Time   NA 132 (L) 07/28/2020 0053   NA 138 06/29/2020 0000   K 4.2 07/28/2020 0053   CL 102 07/28/2020 0053   CO2 21 (L) 07/28/2020 0053   GLUCOSE 108 (H) 07/28/2020 0053   BUN 13 07/28/2020 0053   BUN 9 06/29/2020 0000   CREATININE 0.62 07/28/2020 0053   CREATININE 1.07 04/14/2020 1446   CALCIUM 8.2 (L) 07/28/2020 0053   GFRNONAA >60 07/28/2020 0053   GFRNONAA >60 04/14/2020 1446   GFRAA 109 02/29/2020 1538    INR    Component Value Date/Time   INR 1.0 07/22/2020 2200     Intake/Output Summary (Last 24 hours) at 07/28/2020 0728 Last data filed at 07/28/2020 0500 Gross per 24 hour  Intake --  Output 1245 ml  Net -1245 ml     Assessment:  60 y.o. male is s/p:  left leg embolectomy and re stent left iliac  3 Days Post-Op  Plan: -pt with brisk biphasic left PT doppler signal and monophasic right PT doppler signal -ok for d/c  from vascular standpoint -per Dr. Oneida Alar note yesterday, would keep on therapeutic heparin until time of dc and probably only option as outpt is supratherapeutic lovenox dose as suggested by Oncology earlier this admission.  Risk is going to be intracranial bleed with brain mets.   Leontine Locket, PA-C [facility] [phone] 07/28/2020 7:28 AM

## 2020-07-28 NOTE — H&P (Incomplete)
Physical Medicine and Rehabilitation Admission H&P    Chief Complaint  Patient presents with  . Code Stroke  : HPI: Adam Bender, Adam Bender. 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 Hinton Rao, atrial fibrillation followed by Dr. 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  Patient had undergone redo thrombectomy of his right femoral iliac system on July 07, 2020 at [facility] a wound VAC was applied.  History taken from chart review and family.  Patient lives with spouse 1 level home 2 steps to entry.  Patient did need physical assistance prior to admission.  Presented 07/22/2020 with acute onset of left-sided weakness and dysarthria.  CT of the head showed bilateral temporal lobe metastatic disease.  Subtle architectural distortion of the inferior temporal lobe on the right.  Some volume loss and distortion of the inferior and anterior left temporal lobe.  CT angiogram of head and neck occlusion of distal right M3 branch in the inferior right parietal region.  9 mL core infarct with slight loss of gray-white differentiation at the inferior right parietal region.  No carotid bifurcation disease.  Hilar and mediastinal lymphadenopathy consistent with diagnosis of metastatic disease.  Patient did not receive TPA.  MRI of the brain showed multiple acute right MCA territory infarctions largest in the temporoparietal region.  Minimally increased size of 4 mm right frontal operculum metastasis.  Essentially unchanged size of other lesions with decreased vasogenic edema in the temporal  lobes.  Most recent echocardiogram with ejection fraction of 60 to 65% no wall motion abnormalities.  Admission chemistries unremarkable except sodium 134 potassium 3.2 glucose 213 WBC 24,600 hemoglobin 8.7 blood cultures no growth to date.  Neurology follow-up currently maintained on Decadron protocol Lovenox transition at this time to intravenous heparin.  Wound VAC remained in place after recent redo thrombectomy of right iliac artery at [facility] and currently followed by vascular surgery Dr. Oneida Alar as patient now formed thrombus in his left iliac and femoral system despite being on full anticoagulation underwent left iliac femoral thrombectomy with left common iliac stent on 07/25/2020 per Dr. Oneida Alar.  Palliative care consulted to establish goals of care.  Therapy evaluations completed due to patient decrease in functional ability self-care recommendations physical medicine rehab consult and patient was admitted for a comprehensive rehab program.  Review of Systems  Constitutional: Positive for malaise/fatigue. Negative for chills and fever.  HENT: Negative for hearing loss.   Eyes: Negative for blurred vision and double vision.  Cardiovascular: Positive for palpitations and leg swelling.  Gastrointestinal: Positive for constipation. Negative for heartburn, nausea and vomiting.  Genitourinary: Negative for dysuria, flank pain and hematuria.  Musculoskeletal: Positive for joint pain and myalgias.  Skin: Negative for rash.  Neurological: Positive for speech change and weakness.  Psychiatric/Behavioral: The patient has insomnia.   All other systems reviewed and are negative.  Past Medical History:  Diagnosis Date  . Atrial fibrillation ([place]) 02/29/2020  . Depressed left ventricular ejection fraction 02/29/2020  . Malignant melanoma of other part of trunk ([place]) 02/20/2020  . Malignant melanoma of skin of trunk, except scrotum [facility])    Past Surgical History:  Procedure Laterality Date   . AORTOGRAM  07/25/2020   Procedure: AORTOGRAM with Angiogram;  Surgeon: Elam Dutch, MD;  Location: [place];  Service: Vascular;;  . EMBOLECTOMY Left 07/25/2020   Procedure: LEFT ILIOFEMORAL EMBOLECTOMY;  Surgeon: Elam Dutch, MD;  Location: [place];  Service: Vascular;  Laterality: Left;  . INSERTION OF ILIAC STENT Left 07/25/2020   Procedure: INSERTION OF COMMON ILIAC STENT;  Surgeon: Elam Dutch, MD;  Location: [place];  Service: Vascular;  Laterality: Left;  . MOLE REMOVAL    . SALIVARY GLAND SURGERY    . THROMBECTOMY ILIAC ARTERY Left 07/25/2020   Procedure: THROMBECTOMY ILIAC ARTERY;  Surgeon: Elam Dutch, MD;  Location: [facility] OR;  Service: Vascular;  Laterality: Left;   Family History  Problem Relation Age of Onset  . Hypertension Mother   . Hypertension Father    Social History:  reports that he has quit smoking. He has never used smokeless tobacco. No history on file for alcohol use and drug use. Allergies: No Known Allergies Medications Prior to Admission  Medication Sig Dispense Refill  . acetaminophen (TYLENOL) 500 MG tablet Take 1,000 mg by mouth every 6 (six) hours as needed for mild pain or moderate pain.    Marland Kitchen amiodarone (PACERONE) 200 MG tablet Take 200 mg by mouth daily.    Marland Kitchen aspirin 81 MG chewable tablet Chew 81 mg by mouth daily.    . benzonatate (TESSALON) 100 MG capsule Take 1 capsule by mouth three times daily as needed for cough 90 capsule 0  . cyclobenzaprine (FLEXERIL) 10 MG tablet Take 1 tablet (10 mg total) by mouth 3 (three) times daily as needed for muscle spasms. 30 tablet 5  . dexamethasone (DECADRON) 2 MG tablet Take by mouth See admin instructions. 4mg  x 4 days, 2 mg x 7 days, 1mg  x 7 days    . digoxin (LANOXIN) 0.125 MG tablet Take 0.0625 mg by mouth daily.    Marland Kitchen diltiazem (TIAZAC) 120 MG 24 hr capsule Take 120 mg by mouth daily.    Marland Kitchen enoxaparin (LOVENOX) 60 MG/0.6ML injection Inject 60 mg into the skin in the morning and at bedtime.    .  gabapentin (NEURONTIN) 100 MG capsule Take 100 mg by mouth 3 (three) times daily.    Marland Kitchen lidocaine (LIDODERM) 5 % Place 1 patch onto the skin See admin instructions. Apply to the most painful area for up to 12 hours in a 24 hour period    . melatonin 3 MG TABS tablet Take 3 mg by mouth at bedtime.    . metoprolol tartrate (LOPRESSOR) 50 MG tablet Take 1 tablet (50 mg total) by mouth 2 (two) times daily. 60 tablet 5  . ondansetron (ZOFRAN ODT) 4 MG disintegrating tablet Take 1 tablet (4 mg total) by mouth every 4 (four) hours as needed for nausea or vomiting. 30 tablet 5  . Oxycodone HCl 10 MG TABS Take 10 mg by mouth every 6 (six) hours as needed (pain).    . prochlorperazine (COMPAZINE) 10 MG tablet Take 10 mg by mouth every 6 (six) hours as needed for nausea or vomiting.    Donavan Burnet S 8.6-50 MG tablet Take 2 tablets by mouth 2 (two) times daily.    Marland Kitchen ENTRESTO 24-26 MG Take 1 tablet by mouth 2 (two) times daily. (Patient not taking: No sig reported)    . furosemide (LASIX) 20 MG tablet Take 1 tablet (20 mg total) by mouth daily. (Patient not taking:  Reported on 07/22/2020) 90 tablet 3    Drug Regimen Review Drug regimen was reviewed and remains appropriate with no significant issues identified  Home: Home Living Family/patient expects to be discharged to:: Private residence Living Arrangements: Spouse/significant other Available Help at Discharge: Family,Available 24 hours/day Type of Home: House Home Access: Stairs to enter [employer] of Steps: 2 STE cannot reach both at the same time Home Layout: One level [facility] bed,Bedside commode,Shower seat,Walker - 4 wheels,Walker - 2 wheels   Functional History: Prior Function Level of Independence: Needs assistance Gait / Transfers Assistance Needed: Independent prior to 2/3. ADL's / Homemaking Assistance Needed: Independent prior to admission at [place] 3 weeks ago. Comments: still needed physical assistance after  surgeries at [place] 2 weeks ago; wants to maintain independence as long as possible  Functional Status:  Mobility: Bed Mobility Overal bed mobility: Needs Assistance Bed Mobility: Supine to Sit Supine to sit: Min assist Sit to supine: Min guard General bed mobility comments: Patient able to advance BLE from bed surface to EOB and uses rail with RUE to R EOB, min guard for safety/lines. Transfers Overall transfer level: Needs assistance Equipment used: Rolling walker (2 wheeled),1 person hand held assist Transfers: Sit to/from [employer] Sit to Stand: Min assist Stand pivot transfers: Min assist General transfer comment: Light Min A for sit to stand from EOB x2 trials with cues for hand placement and technique, light assist to manage RW/lines during stand pivot transfer and to keep LUE on RW handle Ambulation/Gait Ambulation/Gait assistance: Min assist Gait Distance (Feet): 3 Feet Assistive device: Rolling walker (2 wheeled) Gait Pattern/deviations: Step-to pattern General Gait Details: minA at most for stability, only with light LUE grip on RW so assist to maintain    ADL: ADL Overall ADL's : Needs assistance/impaired Upper Body Dressing : Minimal assistance Upper Body Dressing Details (indicate cue type and reason): Donned posterior hospital gown. Lower Body Dressing: Minimal assistance,Sit to/from stand Lower Body Dressing Details (indicate cue type and reason): Min A to doff/don footwear seated EOB. Toilet Transfer: Minimal Technical brewer Details (indicate cue type and reason): Simluated with use of RW and hand held assist to maintain position of LUE on RW. Toileting- Clothing Manipulation and Hygiene: Minimal assistance,Sit to/from stand Toileting - Clothing Manipulation Details (indicate cue type and reason): Patient able to stand with use of RW and manage urinal to void bladder with Min A and hand held assist to maintain position of LUE on  RW. General ADL Comments: Patient limited by L-hemiparesis greater in LUE than in LLE, decreased balance, decreased sensation and decreased coordination.  Cognition: Cognition Overall Cognitive Status: Within Functional Limits for tasks assessed Orientation Level: Oriented X4 Cognition Arousal/Alertness: Awake/alert Behavior During Therapy: WFL for tasks assessed/performed Overall Cognitive Status: Within Functional Limits for tasks assessed General Comments: Answered questions and followed cues appropriately, A&Ox4  Physical Exam: Blood pressure 110/69, pulse 78, temperature 97.9 F (36.6 C), temperature source Oral, resp. rate 16, height 5' 2.99" (1.6 m), weight 73.7 kg, SpO2 96 %. Physical Exam Skin:    Comments: Wound VAC in place to right lower extremity fasciotomy site  Neurological:     Comments: Patient is alert.  He is a bit tearful.  Makes eye contact with examiner.  Follows commands.  He is alert and oriented x3.     Results for orders placed or performed during the hospital encounter of 07/22/20 (from the past 48 hour(s))  Glucose, capillary  Status: Abnormal   Collection Time: 07/26/20  6:31 AM  Result Value Ref Range   Glucose-Capillary 242 (H) 70 - 99 mg/dL    Comment: Glucose reference range applies only to samples taken after fasting for at least 8 hours.   Comment 1 Notify RN    Comment 2 Document in Chart   Glucose, capillary     Status: Abnormal   Collection Time: 07/26/20 10:56 AM  Result Value Ref Range   Glucose-Capillary 142 (H) 70 - 99 mg/dL    Comment: Glucose reference range applies only to samples taken after fasting for at least 8 hours.  Heparin level (unfractionated)     Status: Abnormal   Collection Time: 07/26/20 11:49 AM  Result Value Ref Range   Heparin Unfractionated 0.16 (L) 0.30 - 0.70 IU/mL    Comment: (NOTE) If heparin results are below expected values, and patient dosage has  been confirmed, suggest follow up testing of  antithrombin III levels. Performed at [facility], [place] [address], [place], [place] [address]   CBC     Status: Abnormal   Collection Time: 07/26/20  1:26 PM  Result Value Ref Range   WBC 18.4 (H) 4.0 - 10.5 K/uL   RBC 3.11 (L) 4.22 - 5.81 MIL/uL   Hemoglobin 9.4 (L) 13.0 - 17.0 g/dL   HCT 27.3 (L) 39.0 - 52.0 %   MCV 87.8 80.0 - 100.0 fL   MCH 30.2 26.0 - 34.0 pg   MCHC 34.4 30.0 - 36.0 g/dL   RDW 14.6 11.5 - 15.5 %   Platelets 218 150 - 400 K/uL   nRBC 0.0 0.0 - 0.2 %    Comment: Performed at [facility], [place] [address], [place], [place] [address]  Digoxin level     Status: Abnormal   Collection Time: 07/26/20  2:27 PM  Result Value Ref Range   Digoxin Level 0.5 (L) 0.8 - 2.0 ng/mL    Comment: Performed at [facility], [place] [address] [place], [place] [address]  Glucose, capillary     Status: Abnormal   Collection Time: 07/26/20  4:24 PM  Result Value Ref Range   Glucose-Capillary 119 (H) 70 - 99 mg/dL    Comment: Glucose reference range applies only to samples taken after fasting for at least 8 hours.  Heparin level (unfractionated)     Status: Abnormal   Collection Time: 07/26/20  8:23 PM  Result Value Ref Range   Heparin Unfractionated 0.18 (L) 0.30 - 0.70 IU/mL    Comment: (NOTE) If heparin results are below expected values, and patient dosage has  been confirmed, suggest follow up testing of antithrombin III levels. Performed at [facility], [place] [address] [place], [place] [address]   Glucose, capillary     Status: Abnormal   Collection Time: 07/26/20  9:12 PM  Result Value Ref Range   Glucose-Capillary 156 (H) 70 - 99 mg/dL    Comment: Glucose reference range applies only to samples taken after fasting for at least 8 hours.  Basic metabolic panel     Status: Abnormal   Collection Time: 07/27/20  3:58 AM  Result Value Ref Range   Sodium 131 (L) 135 - 145 mmol/L   Potassium 4.1 3.5 - 5.1 mmol/L   Chloride 101 98 - 111 mmol/L   CO2 21  (L) 22 - 32 mmol/L   Glucose, Bld 129 (H) 70 - 99 mg/dL    Comment: Glucose reference  range applies only to samples taken after fasting for at least 8 hours.   BUN 11 6 - 20 mg/dL   Creatinine, Ser 0.65 0.61 - 1.24 mg/dL   Calcium 8.2 (L) 8.9 - 10.3 mg/dL   GFR, Estimated >60 >60 mL/min    Comment: (NOTE) Calculated using the CKD-EPI Creatinine Equation (2021)    Anion gap 9 5 - 15    Comment: Performed at [place] [address], [place], [place] [address]  Heparin level (unfractionated)     Status: Abnormal   Collection Time: 07/27/20  3:58 AM  Result Value Ref Range   Heparin Unfractionated 0.26 (L) 0.30 - 0.70 IU/mL    Comment: (NOTE) If heparin results are below expected values, and patient dosage has  been confirmed, suggest follow up testing of antithrombin III levels. Performed at [facility], [place] [address], [place], [place] [address]   Glucose, capillary     Status: Abnormal   Collection Time: 07/27/20  6:17 AM  Result Value Ref Range   Glucose-Capillary 127 (H) 70 - 99 mg/dL    Comment: Glucose reference range applies only to samples taken after fasting for at least 8 hours.  CBC     Status: Abnormal   Collection Time: 07/27/20  9:10 AM  Result Value Ref Range   WBC 16.8 (H) 4.0 - 10.5 K/uL   RBC 3.04 (L) 4.22 - 5.81 MIL/uL   Hemoglobin 9.0 (L) 13.0 - 17.0 g/dL   HCT 27.1 (L) 39.0 - 52.0 %   MCV 89.1 80.0 - 100.0 fL   MCH 29.6 26.0 - 34.0 pg   MCHC 33.2 30.0 - 36.0 g/dL   RDW 14.6 11.5 - 15.5 %   Platelets 225 150 - 400 K/uL   nRBC 0.0 0.0 - 0.2 %    Comment: Performed at [facility], [place] [address] [place] [address], [place], [place] [address]  Glucose, capillary     Status: Abnormal   Collection Time: 07/27/20 11:18 AM  Result Value Ref Range   Glucose-Capillary 145 (H) 70 - 99 mg/dL    Comment: Glucose reference range applies only to samples taken after fasting for at least 8 hours.   Comment 1 Notify RN    Comment 2 Document in Chart   Heparin  level (unfractionated)     Status: Abnormal   Collection Time: 07/27/20 11:44 AM  Result Value Ref Range   Heparin Unfractionated 0.25 (L) 0.30 - 0.70 IU/mL    Comment: (NOTE) If heparin results are below expected values, and patient dosage has  been confirmed, suggest follow up testing of antithrombin III levels. Performed at [facility], [place] [address] [place], [place] [address]   Glucose, capillary     Status: Abnormal   Collection Time: 07/27/20  4:59 PM  Result Value Ref Range   Glucose-Capillary 206 (H) 70 - 99 mg/dL    Comment: Glucose reference range applies only to samples taken after fasting for at least 8 hours.   Comment 1 Notify RN    Comment 2 Document in Chart   Heparin level (unfractionated)     Status: Abnormal   Collection Time: 07/27/20  7:25 PM  Result Value Ref Range   Heparin Unfractionated 0.28 (L) 0.30 - 0.70 IU/mL    Comment: (NOTE) If heparin results are below expected values, and patient dosage has  been confirmed, suggest follow up testing of antithrombin III levels. Performed at [facility], [address] [place], [place] [address]   Glucose, capillary     Status: Abnormal   Collection Time: 07/27/20  9:26 PM  Result Value Ref Range   Glucose-Capillary 140 (H) 70 - 99 mg/dL    Comment: Glucose reference range applies only to samples taken after fasting for at least 8 hours.  Basic metabolic panel     Status: Abnormal   Collection Time: 07/28/20 12:53 AM  Result Value Ref Range   Sodium 132 (L) 135 - 145 mmol/L   Potassium 4.2 3.5 - 5.1 mmol/L   Chloride 102 98 - 111 mmol/L   CO2 21 (L) 22 - 32 mmol/L   Glucose, Bld 108 (H) 70 - 99 mg/dL    Comment: Glucose reference range applies only to samples taken after fasting for at least 8 hours.   BUN 13 6 - 20 mg/dL   Creatinine, Ser 0.62 0.61 - 1.24 mg/dL   Calcium 8.2 (L) 8.9 - 10.3 mg/dL   GFR, Estimated >60 >60 mL/min    Comment: (NOTE) Calculated using the CKD-EPI Creatinine  Equation (2021)    Anion gap 9 5 - 15    Comment: Performed at [place] [address], [place], [place] [address]  CBC     Status: Abnormal   Collection Time: 07/28/20 12:53 AM  Result Value Ref Range   WBC 19.2 (H) 4.0 - 10.5 K/uL   RBC 2.97 (L) 4.22 - 5.81 MIL/uL   Hemoglobin 8.8 (L) 13.0 - 17.0 g/dL   HCT 26.7 (L) 39.0 - 52.0 %   MCV 89.9 80.0 - 100.0 fL   MCH 29.6 26.0 - 34.0 pg   MCHC 33.0 30.0 - 36.0 g/dL   RDW 14.7 11.5 - 15.5 %   Platelets 258 150 - 400 K/uL   nRBC 0.0 0.0 - 0.2 %    Comment: Performed at [facility], [place] [address] [place], [place] [address]  Heparin level (unfractionated)     Status: None   Collection Time: 07/28/20 12:53 AM  Result Value Ref Range   Heparin Unfractionated 0.37 0.30 - 0.70 IU/mL    Comment: (NOTE) If heparin results are below expected values, and patient dosage has  been confirmed, suggest follow up testing of antithrombin III levels. Performed at [facility], [place] [address], [place], [place] [address]    No results found.     Medical Problem List and Plan: 1.  Left-sided weakness secondary to right MCA infarction  -patient may *** shower  -ELOS/Goals: *** 2.  Antithrombotics: -DVT/anticoagulation: Continue IV heparin until time of discharge.  Outpatient plan is for supratherapeutic Lovenox dose as suggested by oncology Dr. Hinton Rao 80 mg twice daily  -antiplatelet therapy: Aspirin 81 mg daily 3. Pain Management: Lidoderm patch, Neurontin 100 mg 3 times daily, Flexeril and oxycodone as needed 4. Mood: Melatonin 3 mg nightly  -antipsychotic agents: N/A 5. Neuropsych: This patient is capable of making decisions on his own behalf. 6. Skin/Wound Care: Routine skin checks 7. Fluids/Electrolytes/Nutrition: Routine in and outs with follow-up chemistries 8.  History of recurrent right iliac artery thrombus.  Status post left iliac femoral thrombectomy 07/25/2020.  Wound VAC as directed.  Follow-up with Dr.  Oneida Alar of vascular surgery. 9.  Metastatic melanoma with brain, lung, axillary involvement.  Follow-up outpatient Dr. Hinton Rao.  Decadron taper 2 mg x 7 days(2/21-2/27), 1 mg x 7 days(2/28-3-6) 10.  Normocytic anemia.  Follow-up CBC 11.  Atrial fibrillation.  Cardiac rate  controlled.  Continue Cardizem 180 mg daily and Lanoxin 0.625 mg daily as well as Lopressor 50 mg twice daily.  Follow-up Dr.Tobb/Dr. Bettina Gavia 12.  Diastolic congestive heart failure.  Monitor for any signs of fluid overload. ***  Lavon Paganini Angiulli, PA-C 07/28/2020

## 2020-07-28 NOTE — Consult Note (Signed)
[place] Nurse wound follow up Patient receiving care in [facility] 4E19 Wound type: Medial and laterial fasciotomies of the RLE. Undermining beginning at the lower right side of the medial wound.     Dressingprocedure/placement/frequency:  Wound Vac change today. Removed 2 pieces of black foam. Cleansed the wounds with NS. Minor bleeding. Mepitel was used over both wounds followed by 1 piece of black foam to each wound. Drape placed over the foam, immediate suction obtained at 125 mmHg. The wounds were "Y"d together with an adapter with one vac machine. Patient tolerated the dressing change very well.  Supplies at bedside.   [place] nurse to change NPWT dressings M/W/F.  Monitor the wound area(s) for worsening of condition such as: Signs/symptoms of infection, increase in size, development of or worsening of odor, development of pain, or increased pain at the affected locations.   Notify the medical team if any of these develop.  Thank you for the consult. Please re-consult the [facility] team if needed.  Cathlean Marseilles Tamala Julian, MSN, RN, [place], Lysle Pearl, [facility] Wound Treatment Associate Pager [phone]

## 2020-07-28 NOTE — Progress Notes (Addendum)
Addendum: 6-hr f/u heparin level therapeutic at 0.39. Will continue to follow daily heparin level and plans for switching to lovenox therapy in preparation for discharge.   ANTICOAGULATION CONSULT NOTE - Follow Up Consult  Pharmacy Consult for heparin Indication: atrial fibrillation, recent venous and arterial clot, and acute stroke  Labs: Recent Labs    07/26/20 0309 07/26/20 1149 07/26/20 1326 07/26/20 2023 07/27/20 0358 07/27/20 0910 07/27/20 1144 07/27/20 1925 07/28/20 0053  HGB 8.9*  --  9.4*  --   --  9.0*  --   --  8.8*  HCT 25.8*  --  27.3*  --   --  27.1*  --   --  26.7*  PLT 197  --  218  --   --  225  --   --  258  HEPARINUNFRC 0.16*   < >  --    < > 0.26*  --  0.25* 0.28* 0.37  CREATININE 0.79  --   --   --  0.65  --   --   --  0.62   < > = values in this interval not displayed.    Assessment: 60yo male with acute CVA. PMH s/f PFO, cancer, atrial fibrillation on lovenox PTA, recent venous and arterial clots, s/p R iliac thrombectomy with fasciotomy at [facility] on 07/04/20. Pharmacy has been consulted for heparin with a lower goal rate of 0.3-0.5 units/mL.   F/u level therapeutic at 0.47. Increased from prior level of 0.37 which was taken 2hr after change in rate. No s/sx bleeding noted. PLT stable, Hgb trending down slightly to 8.8 today.   Goal of Therapy:  Heparin level 0.3-0.5 units/ml   Plan:  Continue heparin gtt at 1850 units/hr Follow up with 6-hr confirmatory heparin level, then monitor daily Monitor CBC and s/sx bleeding  Mercy Riding, PharmD PGY1 Acute Care Pharmacy Resident Please refer to [facility] for unit-specific pharmacist

## 2020-07-29 DIAGNOSIS — R5381 Other malaise: Secondary | ICD-10-CM

## 2020-07-29 LAB — CBC
HCT: 28.2 % — ABNORMAL LOW (ref 39.0–52.0)
Hemoglobin: 9 g/dL — ABNORMAL LOW (ref 13.0–17.0)
MCH: 29.1 pg (ref 26.0–34.0)
MCHC: 31.9 g/dL (ref 30.0–36.0)
MCV: 91.3 fL (ref 80.0–100.0)
Platelets: 305 10*3/uL (ref 150–400)
RBC: 3.09 MIL/uL — ABNORMAL LOW (ref 4.22–5.81)
RDW: 14.9 % (ref 11.5–15.5)
WBC: 17.3 10*3/uL — ABNORMAL HIGH (ref 4.0–10.5)
nRBC: 0 % (ref 0.0–0.2)

## 2020-07-29 LAB — GLUCOSE, CAPILLARY
Glucose-Capillary: 84 mg/dL (ref 70–99)
Glucose-Capillary: 91 mg/dL (ref 70–99)
Glucose-Capillary: 131 mg/dL — ABNORMAL HIGH (ref 70–99)
Glucose-Capillary: 171 mg/dL — ABNORMAL HIGH (ref 70–99)

## 2020-07-29 NOTE — Progress Notes (Signed)
Family Medicine Teaching Service Daily Progress Note Intern Pager: [phone]  Patient name: Adam Bender. Medical record number: [id number] Date of birth: 07-Jan-1961 Age: 60 y.o. Gender: male  Primary Care Provider: Patient, No Pcp Per Consultants: Neurology, Vascular, Heme/Onc, Palliative Code Status: Full  Pt Overview and Major Events to Date:  02/19- Admitted  02/22-Left Embolectomy with Fasciotomy  Assessment and Plan: Adam Benderis a 60 y.o.malepresenting with slurred speech, left-sided facial droop, left-sided weakness found to have CVA and progression of metastatic disease.PMH is significant formetastatic malignant melanoma, brain metastases, atrial fibrillation with recent venous and arterial clot s/p right lower extremity thrombectomy and fasciotomy on 07/04/2020 and repeat thrombectomy on 07/07/2020.  Goals ofCare Patient and wife understanding poor prognosis.  They want to take patient home.  Have agreed to transition from Heparin to Lovenox.  First dose yesterday.  Will obtain LMW heparin labs 4 hours after third dose of heparin (time sensitive).  If in therapeutic range will be able to discharge on Monday with Lovenox 80 mg BID and ASA 81 mg daily. Palliative Care will follow outpatient.  S/p Left Iliac and Femoral Thrombectomy 2/22 History of Recurrent Right Iliac Artery Thrombus Stable.  Wound vac in place on right lower extremity, little drainage noted. Vascular has now signed off.  Transitioned from Heparin drip to Lovenox -Continue Lovenox 80 mg BID -LMWH labs scheduled for four hours after third dose   Metastatic Melanomawith Brain, Lung, Axillary Involvement Stable. Alert and oriented.  Palliative care involved and will follow outpatient.  Follows with Dr Hinton Rao, Oncology in [place]. -Follow up outpatient with Onc for immunotherapy treatment and radiation. Treatment is palliative and not curative.  Patient and family aware of this. -Continue  steroid taper per outpatient Onc.  Left-Sided Weakness 2/2 Acute CVA Stable. Improving.  Continues to have LUE weakness and sensation wnl.. Switched from Heparin to supratherapeutic Lovenox dose last night. -Continue Lovenox 80 mg BID -Low Molecular weight labs to be drawn 4 hours after 2100 dose of Lovenox (02/26) -Monitor for worsening symptoms  Atrial Fibrillation Stable.  Follows with Cardiology in [place]. Currently controlled rate.  Heparin drip was switched to Lovenox yesterday -Monitor Lovenox levels -Continue Lovenox 80 mg BID  -Continue ASA 81 mg daily -Follow up outpatient cardiology  Positive Blood Culture (1/4 bottles growing Staphylococcus epidermidis) Stable. Continues to be afebrile and vital signs stable. No signs of infection. Likely contaminant. -Continue to monitor, low threshold to repeat cultures  -CBC in am  Hyperglycemiain the Setting of No Diabetes Mellitus Diagnosis (A1c: 6.4%) Stable.  Likely due to steroid use.  CBG 84-131 overnight. Received 3 u insulin coverage yesterday. -Continue sSSI and CBG montioring  Leukocytosis, stable Stable.  On steroids.  Afebrile and no signs of infections. -Continue to monitor  HFrEF, Previous EF 50% in 2021 Stable.Continue to hold home medications Entresto and Lasix   FEN/GI:Regular diet PPx: Heparin drip, pharm to dose/adjust rate  Status is: Inpatient  Remains inpatient appropriate because:Ongoing diagnostic testing needed not appropriate for outpatient work up   Dispo:  Patient From: Home  Planned Disposition: Home with Health Care Svc  Medically stable for discharge: No          Subjective:  No acute events overnight.  Denies any pain.  Reports improvement in strength.  Objective: Temp:  [97.6 F (36.4 C)-98.3 F (36.8 C)] 97.9 F (36.6 C) (02/26 0400) Pulse Rate:  [67-84] 73 (02/26 0000) Resp:  [15-17] 16 (02/26 0400) BP: (90-121)/(59-79) 113/59 (02/26  0400) SpO2:  [95 %-98 %] 96 %  (02/26 0400)  Physical Exam: General: 60 y.o. male in NAD Cardio: RRR no m/r/g Lungs: CTAB, no wheezing, no rhonchi, no crackles, no IWOB on room air Abdomen: Soft, non-tender to palpation, non-distended, positive bowel sounds Skin: warm and dry Extremities: No edema, right wound vac in place  Laboratory: Recent Labs  Lab 07/27/20 0910 07/28/20 0053 07/29/20 0158  WBC 16.8* 19.2* 17.3*  HGB 9.0* 8.8* 9.0*  HCT 27.1* 26.7* 28.2*  PLT 225 258 305   Recent Labs  Lab 07/22/20 2200 07/22/20 2212 07/26/20 0309 07/27/20 0358 07/28/20 0053  NA 134*   < > 131* 131* 132*  K 3.2*   < > 4.0 4.1 4.2  CL 100   < > 101 101 102  CO2 22   < > 22 21* 21*  BUN 13   < > 14 11 13   CREATININE 0.72   < > 0.79 0.65 0.62  CALCIUM 8.0*   < > 7.6* 8.2* 8.2*  PROT 5.5*  --   --   --   --   BILITOT 0.4  --   --   --   --   ALKPHOS 294*  --   --   --   --   ALT 28  --   --   --   --   AST 27  --   --   --   --   GLUCOSE 213*   < > 129* 129* 108*   < > = values in this interval not displayed.      Imaging/Diagnostic Tests: No results found.  Carollee Leitz, MD 07/29/2020, 7:30 AM PGY-2, [place] Intern pager: [phone], text pages welcome

## 2020-07-29 NOTE — Progress Notes (Signed)
Occupational Therapy Treatment Patient Details Name: Adam Bender. MRN: [id number] DOB: 02/04/1961 Today's Date: 07/29/2020    History of present illness 60yo male presenting with acute CVA symptoms likely due to embolic event and related ischemia. Found to have multiple R MCA CVA acute infarcts. PMH metastatic malignant melanoma with brain mets, A-fib with recent venous and arterial clots s/p thrombectomy, hypercoagulable state due to high level cancer.   OT comments  HEP sheets complied, printed, and reviewed with the patient this date.  He was able to demo understanding and compliance.  His spouse will need to assist and cue him through HEP sheets.  Patient tends to use accessory mm in the movement.  Education and demo to isolate the movement and exercise to avoid abnormal movement patterns.  OT will continue to follow up to ensure compliance, and ensure family understanding.  Patient anticipates discharge home with [facility].    Follow Up Recommendations  Home health OT    Equipment Recommendations  None recommended by OT    Recommendations for Other Services      Precautions / Restrictions Precautions Precautions: Fall;Other (comment) Precaution Comments: L hemi (LUE>LLE), hypercoagulable 2/2 cancer Restrictions RLE Weight Bearing: Weight bearing as tolerated LLE Weight Bearing: Weight bearing as tolerated Other Position/Activity Restrictions: L hemiparesis                                  Cognition Arousal/Alertness: Awake/alert Behavior During Therapy: WFL for tasks assessed/performed Overall Cognitive Status: Within Functional Limits for tasks assessed                                          Exercises General Exercises - Upper Extremity Shoulder Flexion: AAROM;Both;10 reps Elbow Flexion: AROM;Left;10 reps Elbow Extension: AROM;Left;10 reps Wrist Flexion: AROM;10 reps;Left Wrist Extension: AROM;Left;10 reps Digit Composite Flexion:  AROM;Left;10 reps Composite Extension: AROM;Left;10 reps Hand Exercises Forearm Supination: AROM;Left;10 reps Forearm Pronation: AROM;Left;10 reps Hand Activities Stack Objects: Left;Seated Other Exercises Other Exercises: Fine motor: picking 3 objects out of theraputty Other Exercises: Gross grasp and in hand manipulation L UE with theraputty    Pertinent Vitals/ Pain       Faces Pain Scale: Hurts little more Pain Location: L axilla area to forward flexion of the shoulder and R foot with WB Pain Descriptors / Indicators: Tender;Numbness Pain Intervention(s): Monitored during session                                                          Frequency  Min 2X/week        Progress Toward Goals  OT Goals(current goals can now be found in the care plan section)  Progress towards OT goals: Progressing toward goals  Acute Rehab OT Goals Patient Stated Goal: be as independent as possible at home OT Goal Formulation: With patient Time For Goal Achievement: 08/28/2020 Potential to Achieve Goals: Good  Plan Discharge plan remains appropriate    Co-evaluation                 AM-PAC OT "6 Clicks" Daily Activity     Outcome Measure   Help from another person  eating meals?: A Little Help from another person taking care of personal grooming?: A Little Help from another person toileting, which includes using toliet, bedpan, or urinal?: A Little Help from another person bathing (including washing, rinsing, drying)?: A Lot Help from another person to put on and taking off regular upper body clothing?: A Little Help from another person to put on and taking off regular lower body clothing?: A Lot 6 Click Score: 16    End of Session    OT Visit Diagnosis: Unsteadiness on feet (R26.81);Other abnormalities of gait and mobility (R26.89);Muscle weakness (generalized) (M62.81);Hemiplegia and hemiparesis;Pain Hemiplegia - Right/Left: Left Hemiplegia -  dominant/non-dominant: Non-Dominant Hemiplegia - caused by: Cerebral infarction Pain - Right/Left: Left Pain - part of body: Arm   Activity Tolerance Patient tolerated treatment well   Patient Left in bed;with call bell/phone within reach   Nurse Communication          Time: 1037-1050 OT Time Calculation (min): 13 min  Charges: OT General Charges $OT Visit: 1 Visit OT Treatments $Therapeutic Exercise: 8-22 mins  07/29/2020  Rich, OTR/L  Acute Rehabilitation Services  Office:  [phone]    Metta Clines 07/29/2020, 10:54 AM

## 2020-07-29 NOTE — Progress Notes (Signed)
Daily Progress Note   Patient Name: Adam Bender.       Date: 07/29/2020 DOB: 1961/05/18  Age: 60 y.o. MRN#: [id number] Attending Physician: McDiarmid, Blane Ohara, MD Primary Care Physician: Patient, No Pcp Per Admit Date: 07/22/2020  Reason for Consultation/Follow-up: Establishing goals of care  Subjective: Chart review performed. Received report from primary RN - no acute concerns.  Went to visit patient at bedside - no family/visitors present. Patient was lying in bed asleep - he does wake to voice and gentle touch. Patient states he recently received pain medication - feels his pain is well managed at this time. No signs or non-verbal gestures of pain or discomfort noted. No respiratory distress, increased work of breathing, or secretions noted.   Created space and opportunity for patient to express thoughts and feelings regarding his current medical situation. Emotional support and therapeutic listening provided.  Introduced, reviewed, and completed MOST form as outlined under recommendation section below; however, not finalized. Patient would like to review form with his wife/[name] before signing. MOST form left at bedside for review with wife when she returns. He is agreeable for PMT to return tomorrow to finalize.  All questions and concerns addressed. Encouraged to call with questions and/or concerns. PMT card previously provided.  Length of Stay: 6  Current Medications: Scheduled Meds:  . acetaminophen  1,000 mg Oral Q6H  . amiodarone  200 mg Oral Daily  . aspirin  81 mg Oral Daily  . Chlorhexidine Gluconate Cloth  6 each Topical Daily  . dexamethasone  2 mg Oral Daily   Followed by  . [START ON 07/31/2020] dexamethasone  1 mg Oral Daily  . digoxin  0.0625 mg Oral Daily  .  diltiazem  180 mg Oral Daily  . enoxaparin (LOVENOX) injection  80 mg Subcutaneous Q12H  . gabapentin  100 mg Oral TID  . insulin aspart  0-9 Units Subcutaneous TID WC  . lidocaine  1 patch Transdermal Daily  . melatonin  3 mg Oral QHS  . metoprolol tartrate  50 mg Oral BID  . sodium chloride flush  3 mL Intravenous Once    Continuous Infusions: . sodium chloride 10 mL/hr at 07/26/20 0020    PRN Meds: cyclobenzaprine, HYDROmorphone (DILAUDID) injection, ondansetron, oxyCODONE, senna-docusate  Physical Exam Vitals and nursing note reviewed.  Constitutional:      General: He is not in acute distress.    Appearance: He is ill-appearing.     Comments: Frail appearing  Pulmonary:     Effort: No respiratory distress.  Skin:    General: Skin is warm and dry.  Neurological:     Mental Status: He is alert and oriented to person, place, and time.     Motor: Weakness present.  Psychiatric:        Attention and Perception: Attention normal.        Behavior: Behavior is cooperative.        Cognition and Memory: Cognition and memory normal.             Vital Signs: BP 104/74 (BP Location: Right Arm)   Pulse 100   Temp 98.4 F (36.9 C) (Oral)   Resp 17   Ht 5' 2.99" (1.6 m)   Wt 73.7 kg   SpO2 97%   BMI 28.79 kg/m  SpO2: SpO2: 97 % O2 Device: O2 Device: Room Air O2 Flow Rate:    Intake/output summary:   Intake/Output Summary (Last 24 hours) at 07/29/2020 1522 Last data filed at 07/29/2020 1300 Gross per 24 hour  Intake 240 ml  Output 2300 ml  Net -2060 ml   LBM: Last BM Date: 07/27/20 Baseline Weight: Weight: 73.7 kg Most recent weight: Weight: 73.7 kg       Palliative Assessment/Data: PPS 40-50%    Flowsheet Rows   Flowsheet Row Most Recent Value  Intake Tab   Referral Department Hospitalist  Unit at Time of Referral ER  Palliative Care Primary Diagnosis Cancer  Date Notified 07/23/20  Palliative Care Type New Palliative care  Reason for referral Clarify  Goals of Care  Date of Admission 07/22/20  Date first seen by Palliative Care 07/23/20  # of days Palliative referral response time 0 Day(s)  # of days IP prior to Palliative referral 1  Clinical Assessment   Psychosocial & Spiritual Assessment   Palliative Care Outcomes   Patient/Family meeting held? Yes  Who was at the meeting? patient, wife  Palliative Care Outcomes Clarified goals of care, Counseled regarding hospice, Provided psychosocial or spiritual support      Patient Active Problem List   Diagnosis Date Noted  . Chronic combined systolic and diastolic congestive heart failure ([place])   . Stroke ([place]) 07/23/2020  . Iliac artery thrombosis, right ([place]) 07/23/2020  . 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 Left arm numbness   . Left arm weakness   . Nausea with vomiting 06/29/2020  . Swollen lymph nodes 06/26/2020  . Pulmonary nodules 05/10/2020  . Atrial fibrillation with rapid ventricular response ([place]) 02/29/2020  . Depressed left ventricular ejection fraction 02/29/2020  . Malignant melanoma ([place]) 02/20/2020  . Brain metastases ([place]) 01/11/2020  . Skin, metastatic cancer to [facility]) 01/11/2020    Palliative Care Assessment & Plan   Patient Profile: 60 y.o.malewith past medical history of atrial fibrillation, pulmonary embolism, SVC thrombus,right iliac artery embolism status post thrombectomy and stent placement, CHF, recent diagnosis of malignant melanoma with brain metastasis presented to the ED on 07/22/20 from home with wife's concerns of patient's facial droop, slurred speech, and no left arm movement or sensation. Code Stroke was initiated on arrival to ED and CTA/CTP showed right M3 branch occlusionand corresponding ischemia.Patient wasadmittedto Family Medicine TSwith CVA with left sided weakness and metastatic melanoma.  Patient was hospitalized at [facility] 2/1--2/17 and underwent right iliac thrombectomy with  fasciotomy on 2/1. Discharged on  lovenox and with a wound vac.  Assessment: Acute CVA Metastatic melanoma Brain metastasis Atrial fibrillation with RVR Acute on chronic anemia Right lower extremity wound, s/p fasciotomy Superior vena cava thrombus Hyponatremia Hypoalbuminemia S/pleft iliac and femoral thrombectomy, and left common iliac stent  Hypercoagulable state secondary to malignancy  Recommendations/Plan: Continue current medical treatment Continue full code status MOST form completed, but not finalized, as follows: Attempt CPR, Full Scope Treatment (trial of intubation, would not want prolonged airway interventions), Determine use or limitation of antibiotics when infection occurs, IVF for a defined trial period, Feeding tube for a defined trial period (would not want PEG tube long term) PMT will follow up tomorrow 2/27 to finalize MOST form Patient is aware he is high risk of rehospitalization and recurrent blood clots Patient continues to be appropriate for hospice services if/when interested in transition to comfort measures PMT will continue to follow and support holistically  Goals of Care and Additional Recommendations: Limitations on Scope of Treatment: Full Scope Treatment  Code Status:    Code Status Orders  (From admission, onward)         Start     Ordered   07/23/20 0736  Full code  Continuous        07/23/20 0735        Code Status History    Date Active Date Inactive Code Status Order ID Comments User Context   07/23/2020 0729 07/23/2020 0735 Full Code [id number]  Gladys Damme, MD ED   Advance Care Planning Activity      Prognosis:  < 6 months  Discharge Planning: Home with [place] was discussed with primary RN, patient  Thank you for allowing the Palliative Medicine Team to assist in the care of this patient.   Total Time 37 minutes Prolonged Time Billed  no       Greater than 50%  of this time was spent counseling and coordinating care  related to the above assessment and plan.  Lin Landsman, NP  Please contact Palliative Medicine Team phone at [phone] for questions and concerns.

## 2020-07-30 LAB — HEPARIN ANTI-XA: Heparin LMW: 1.09 IU/mL

## 2020-07-30 LAB — CBC
HCT: 28 % — ABNORMAL LOW (ref 39.0–52.0)
Hemoglobin: 9.4 g/dL — ABNORMAL LOW (ref 13.0–17.0)
MCH: 29.9 pg (ref 26.0–34.0)
MCHC: 33.6 g/dL (ref 30.0–36.0)
MCV: 89.2 fL (ref 80.0–100.0)
Platelets: 326 10*3/uL (ref 150–400)
RBC: 3.14 MIL/uL — ABNORMAL LOW (ref 4.22–5.81)
RDW: 14.9 % (ref 11.5–15.5)
WBC: 15 10*3/uL — ABNORMAL HIGH (ref 4.0–10.5)
nRBC: 0 % (ref 0.0–0.2)

## 2020-07-30 LAB — GLUCOSE, CAPILLARY
Glucose-Capillary: 120 mg/dL — ABNORMAL HIGH (ref 70–99)
Glucose-Capillary: 131 mg/dL — ABNORMAL HIGH (ref 70–99)

## 2020-07-30 MED ORDER — ENOXAPARIN SODIUM 80 MG/0.8ML ~~LOC~~ SOLN: 80.0000 mg | Freq: Two times a day (BID) | SUBCUTANEOUS | 0 refills | Status: AC

## 2020-07-30 MED ORDER — DILTIAZEM HCL ER COATED BEADS 180 MG PO CP24: 180.0000 mg | ORAL_CAPSULE | Freq: Every day | ORAL | 0 refills | Status: AC

## 2020-07-30 MED ORDER — DEXAMETHASONE 2 MG PO TABS: 1.0000 mg | ORAL_TABLET | Freq: Every day | ORAL | 0 refills | Status: AC

## 2020-07-30 MED ORDER — HALOPERIDOL DECANOATE 100 MG/ML IM SOLN
100.0000 mg | Freq: Once | INTRAMUSCULAR | Status: DC
  Filled 2020-07-30: qty 1
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
## 2020-07-30 NOTE — Progress Notes (Signed)
ANTICOAGULATION CONSULT NOTE - Follow Up Consult  Pharmacy Consult for Lovenox Indication: atrial fibrillation, recent venous and arterial clot, and acute stroke  Labs: Recent Labs     0000 07/27/20 0358 07/27/20 0910 07/27/20 1144 07/28/20 0053 07/28/20 0802 07/28/20 1343 07/29/20 0158 07/30/20 0029  HGB   < >  --  9.0*  --  8.8*  --   --  9.0*  --   HCT  --   --  27.1*  --  26.7*  --   --  28.2*  --   PLT  --   --  225  --  258  --   --  305  --   HEPARINUNFRC  --  0.26*  --    < > 0.37 0.47 0.39  --   --   HEPRLOWMOCWT  --   --   --   --   --   --   --   --  1.09  CREATININE  --  0.65  --   --  0.62  --   --   --   --    < > = values in this interval not displayed.    Assessment: 60 yo male with Afib, recent CVA, for Lovenox.   Goal of Therapy:  LMWH level 4 hrs after dose 0.6-1.2  Plan:  Continue Lovenox 80 mg SQ 12h  Phillis Knack, PharmD, BCPS

## 2020-07-30 NOTE — Discharge Summary (Cosign Needed Addendum)
[facility] Discharge Summary  Patient name: Nizar Cutler. Medical record number: [id number] Date of birth: 1961/02/02 Age: 60 y.o. Gender: male Date of Admission: 07/22/2020  Date of Discharge: 07/30/2020 Admitting Physician: Gladys Damme, MD  Primary Care Provider: Patient, No Pcp Per Consultants: Vascular surgery, oncology, palliative care, neurology, wound care  Indication for Hospitalization: New CVA  Discharge Diagnoses/Problem List:  Goals of care S/p left iliac and femoral thrombectomy Metastatic melanoma-progressed Left-sided weakness secondary to acute CVA Atrial fibrillation  Disposition: Home with home health  Discharge Condition: Stable  Discharge Exam:  General: Alert and cooperative and appears to be in no acute distress.  Lying comfortably in bed. Cardio: Normal S1 and S2, no S3 or S4. Rhythm is regular. No murmurs or rubs.   Pulm: Clear to auscultation bilaterally, no crackles, wheezing, or diminished breath sounds. Normal respiratory effort Abdomen: Bowel sounds normal. Abdomen soft and non-tender.  Extremities: No peripheral edema. Warm/ well perfused.  Strong radial pulses.  Wound VAC in place on right lower leg. Neuro: Cranial nerves grossly intact.  Continued weakness and difficulty using left arm.  Brief Hospital Course:  Ora Mcnatt. is a 60 y.o. male presenting with slurred speech, left-sided facial droop, left-sided weakness. PMH is significant for metastatic malignant melanoma, brain metastases, atrial fibrillation with recent venous and arterial clot s/p vascular surgery 2/1 and 2/4.  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 Stat CTA revealed patent right-sided circulation but a large new clot in his left iliac and femoral arteries. Vascular surgery was  consulted and he underwent a left femoral-iliac thrombectomy on 8/58 without complication. Following the surgery, he was left on a heparin drip until 2/25 at which time he was transitioned to supra therapeutic Lovenox. His Lovenox dose is 80 mg twice daily. He received 3 doses of Lovenox and had a Lovenox level drawn which was shown to be therapeutic. He was discharged home on Lovenox 80 mg twice daily.  Left-Sided Weakness 2/2 Acute CVA Patient recently discharged from [facility] on 07/20/20 for thrombectomy, on lovenox 60 mg twice daily (slightly below the recommended dosing due to hematoma postoperatively). On admission patient developed slurred speech, left facial droop, and left-sided weakness and numbness. He presented to [facility] where CT head neg for acute finding, CTA/CTP showed right M3 branch occlusion with associated ischemia. MRI of the brain showed multiple acute right MCA territory infarcts, largest in the temporoparietal region. Minimally increased size of 4 mm right frontal operculum metastasis. Neurology was consulted who noted this could be embolic from A fib versus paradoxical embolus due to previous thrombus burden. Recommended shorter acting agent given size of infarct but need for anticoagulation in the setting of hypercoagulable state. Hematology-oncology recommends lovenox 80mg  BID and ASA 81 as an outpatient. He was discharged with a supratherapeutic Lovenox of 80 mg twice daily and aspirin 81. At the time of discharge, speech and lower extremity weakness improved, but LUE continues to remain weak.   Metastatic Melanoma Follows with Dr. Hinton Rao. Metastases to brain, pulmonary nodules, left axillary nodules. CTA on 2/21 showed evidence of progression of his known pulmonary metastases, progression of his left axillary mass and potential new renal metastasis.  Goals of care During his hospitalization, he was able to meet with palliative care on several occasions to discuss goals of care in the  setting  of his progressed metastatic melanoma and increased hypercoagulability leading to new strokes and potential for critical limb ischemia. At discharge, it was his desire to continue to move forward with all available treatment options. He would like to remain full code. Palliative care will continue to follow in the outpatient setting.  Congestive heart failure During his hospitalization, his systolic blood pressure remained around 110-120 and he did not demonstrate any signs of volume overload. His Entresto was held due to his lower blood pressures and the Lasix was held due to his euvolemic status.  Atrial fibrillation During his hospitalization, his diltiazem was increased to 180 mg daily.  Issues for Follow Up:  1. Ensure follow-up with oncology for additional treatment options 2. Ensure follow-up with [facility] vascular surgery to address his right lower extremity leg wound and wound VAC. 3. Ensure correct use of his Lovenox 80 twice daily anticoagulation  4. assess volume status and consider restarting Lasix. There is likely minimal benefit to restarting Entresto due to his poor prognosis 5. Continue goals of care discussion    Significant Procedures: 2/22: Left femoral-iliac thrombectomy 2/22: PRBCs - 2 units   Significant Labs and Imaging:  Recent Labs  Lab 07/28/20 0053 07/29/20 0158 07/30/20 0029  WBC 19.2* 17.3* 15.0*  HGB 8.8* 9.0* 9.4*  HCT 26.7* 28.2* 28.0*  PLT 258 305 326   Recent Labs  Lab 07/23/20 1711 07/24/20 0241 07/25/20 0347 07/25/20 1314 07/25/20 1425 07/25/20 1617 07/26/20 0309 07/27/20 0358 07/28/20 0053  NA 131*   < > 133*   < > 134* 134* 131* 131* 132*  K 4.7   < > 4.2   < > 5.2* 4.4 4.0 4.1 4.2  CL 101   < > 98  --   --  102 101 101 102  CO2 21*   < > 24  --   --  21* 22 21* 21*  GLUCOSE 130*   < > 115*  --   --  113* 129* 129* 108*  BUN 12   < > 18  --   --  17 14 11 13   CREATININE 0.74   < > 0.83  --   --  0.77 0.79 0.65 0.62  CALCIUM  8.2*   < > 8.3*  --   --  7.8* 7.6* 8.2* 8.2*  MG 2.2  --   --   --   --   --   --   --   --    < > = values in this interval not displayed.    CT Code Stroke CTA Head W/WO contrast  Result Date: 07/22/2020 CLINICAL DATA:  Metastatic disease to the brain. Left-sided weakness. Speech disturbance. EXAM: CT ANGIOGRAPHY HEAD AND NECK CT PERFUSION BRAIN TECHNIQUE: Multidetector CT imaging of the head and neck was performed using the standard protocol during bolus administration of intravenous contrast. Multiplanar CT image reconstructions and MIPs were obtained to evaluate the vascular anatomy. Carotid stenosis measurements (when applicable) are obtained utilizing NASCET criteria, using the distal internal carotid diameter as the denominator. Multiphase CT imaging of the brain was performed following IV bolus contrast injection. Subsequent parametric perfusion maps were calculated using RAPID software. CONTRAST:  111mL OMNIPAQUE IOHEXOL 350 MG/ML SOLN COMPARISON:  Head CT earlier same day.  MRI 06/15/2020. FINDINGS: CTA NECK FINDINGS Aortic arch: Normal.  No atherosclerotic change. Right carotid system: Common carotid artery widely patent to the bifurcation. Carotid bifurcation is normal without stenosis or irregularity. Cervical ICA widely patent. Left  carotid system: Common carotid artery widely patent to the bifurcation. Carotid bifurcation is normal. Cervical ICA is normal. Vertebral arteries: Both vertebral artery origins are widely patent. Both vertebral arteries appear normal through the cervical region to the foramen magnum. Skeleton: Mid cervical spondylosis. Other neck: No neck mass identified. Upper chest: Hilar and mediastinal lymphadenopathy. Review of the MIP images confirms the above findings CTA HEAD FINDINGS Anterior circulation: Both internal carotid arteries are patent through the skull base and siphon regions. The left anterior circulation is normal. On the right, there is an occluded distal M3  branch in the inferior parietal region. Posterior circulation: Both vertebral arteries widely patent to the basilar. No basilar stenosis. Posterior circulation branch vessels are patent. Patent bilateral posterior communicating arteries. Venous sinuses: Patent and normal. Anatomic variants: None significant. Review of the MIP images confirms the above findings CT Brain Perfusion Findings: ASPECTS: 10 on the initial head CT reading. With the benefit the perfusion imaging, there could be a score of 9 awarded, with slight loss of gray-white differentiation at the inferior right parietal region. CBF (<30%) Volume: 69mL Perfusion (Tmax>6.0s) volume: 5mL Mismatch Volume: 52mL Infarction Location:Inferior right parietal lobe. IMPRESSION: 1. Occlusion of a distal right M3 branch in the inferior right parietal region. 9 mL core infarction with slight loss of gray-white differentiation at the inferior right parietal region. 19 CC region of at risk brain with a mismatch volume of 19 mL. 2. No carotid bifurcation disease. No visible aortic atherosclerosis. 3. Hilar and mediastinal lymphadenopathy consistent with the diagnosis of metastatic disease. Electronically Signed   By: Nelson Chimes M.D.   On: 07/22/2020 22:50   CT Code Stroke CTA Neck W/WO contrast  Result Date: 07/22/2020 CLINICAL DATA:  Metastatic disease to the brain. Left-sided weakness. Speech disturbance. EXAM: CT ANGIOGRAPHY HEAD AND NECK CT PERFUSION BRAIN TECHNIQUE: Multidetector CT imaging of the head and neck was performed using the standard protocol during bolus administration of intravenous contrast. Multiplanar CT image reconstructions and MIPs were obtained to evaluate the vascular anatomy. Carotid stenosis measurements (when applicable) are obtained utilizing NASCET criteria, using the distal internal carotid diameter as the denominator. Multiphase CT imaging of the brain was performed following IV bolus contrast injection. Subsequent parametric  perfusion maps were calculated using RAPID software. CONTRAST:  114mL OMNIPAQUE IOHEXOL 350 MG/ML SOLN COMPARISON:  Head CT earlier same day.  MRI 06/15/2020. FINDINGS: CTA NECK FINDINGS Aortic arch: Normal.  No atherosclerotic change. Right carotid system: Common carotid artery widely patent to the bifurcation. Carotid bifurcation is normal without stenosis or irregularity. Cervical ICA widely patent. Left carotid system: Common carotid artery widely patent to the bifurcation. Carotid bifurcation is normal. Cervical ICA is normal. Vertebral arteries: Both vertebral artery origins are widely patent. Both vertebral arteries appear normal through the cervical region to the foramen magnum. Skeleton: Mid cervical spondylosis. Other neck: No neck mass identified. Upper chest: Hilar and mediastinal lymphadenopathy. Review of the MIP images confirms the above findings CTA HEAD FINDINGS Anterior circulation: Both internal carotid arteries are patent through the skull base and siphon regions. The left anterior circulation is normal. On the right, there is an occluded distal M3 branch in the inferior parietal region. Posterior circulation: Both vertebral arteries widely patent to the basilar. No basilar stenosis. Posterior circulation branch vessels are patent. Patent bilateral posterior communicating arteries. Venous sinuses: Patent and normal. Anatomic variants: None significant. Review of the MIP images confirms the above findings CT Brain Perfusion Findings: ASPECTS: 10 on the initial head  CT reading. With the benefit the perfusion imaging, there could be a score of 9 awarded, with slight loss of gray-white differentiation at the inferior right parietal region. CBF (<30%) Volume: 88mL Perfusion (Tmax>6.0s) volume: 18mL Mismatch Volume: 11mL Infarction Location:Inferior right parietal lobe. IMPRESSION: 1. Occlusion of a distal right M3 branch in the inferior right parietal region. 9 mL core infarction with slight loss of  gray-white differentiation at the inferior right parietal region. 19 CC region of at risk brain with a mismatch volume of 19 mL. 2. No carotid bifurcation disease. No visible aortic atherosclerosis. 3. Hilar and mediastinal lymphadenopathy consistent with the diagnosis of metastatic disease. Electronically Signed   By: Nelson Chimes M.D.   On: 07/22/2020 22:50   MR BRAIN W WO CONTRAST  Result Date: 07/23/2020 CLINICAL DATA:  Follow-up right MCA infarct. History of metastatic melanoma. EXAM: MRI HEAD WITHOUT AND WITH CONTRAST TECHNIQUE: Multiplanar, multiecho pulse sequences of the brain and surrounding structures were obtained without and with intravenous contrast. CONTRAST:  65mL GADAVIST GADOBUTROL 1 MMOL/ML IV SOLN COMPARISON:  Head CT, CTA, and CTP 07/22/2020.  Head MRI 06/15/2020. FINDINGS: Brain: There are multifocal acute right MCA territory infarcts including a 7 cm infarct involving the right temporoparietal region. Smaller acute infarcts are noted in the high frontoparietal region, right centrum semiovale, anterior right temporal lobe, right occipital lobe, and right caudate nucleus. There is petechial hemorrhage associated with the anterior right temporal, right caudate, and right temporoparietal infarcts. A 1.3 cm lesion in the anteroinferior right temporal lobe (series 18, image 10) and 1.8 cm lesion in the anterior left temporal lobe (series 18, image 19) are unchanged in size from the prior MRI and demonstrate intrinsic T1 hyperintensity. Mild vasogenic edema associated with both lesions has mildly decreased from the prior MRI. A 3 mm intrinsically T1 hyperintense lesion laterally in the right temporal lobe is stable to minimally smaller (series 18, image 17), and a 3 mm enhancing lesion in the anterior left frontal lobe is unchanged (series 18, image 23). A 4 mm enhancing lesion in the right frontal operculum has mildly enlarged (series 18, image 26, previously 2 mm). Leptomeningeal enhancement  laterally over the left frontal and temporal lobes has not significantly changed. New leptomeningeal enhancement in the right temporoparietal region is attributed to the acute infarct rather than metastatic disease. The ventricles are normal in size. There is no midline shift or extra-axial fluid collection. Vascular: Major intracranial vascular flow voids are preserved. Skull and upper cervical spine: Unremarkable bone marrow signal. Sinuses/Orbits: Unremarkable orbits. Minimal mucosal thickening in the left maxillary sinus. Clear mastoid air cells. Other: None. IMPRESSION: 1. Multiple acute right MCA territory infarcts, largest in the temporoparietal region. 2. Minimally increased size of 4 mm right frontal operculum metastasis. 3. Essentially unchanged size of other lesions with decreased vasogenic edema in the temporal lobes. 4. Unchanged left cerebral leptomeningeal enhancement. Electronically Signed   By: Logan Bores M.D.   On: 07/23/2020 19:30   CT ANGIO AO+BIFEM W & OR WO CONTRAST  Result Date: 07/25/2020 CLINICAL DATA:  60 year old male with arterial embolism into lower leg. EXAM: CT ANGIOGRAPHY OF ABDOMINAL AORTA WITH ILIOFEMORAL RUNOFF TECHNIQUE: Multidetector CT imaging of the abdomen, pelvis and lower extremities was performed using the standard protocol during bolus administration of intravenous contrast. Multiplanar CT image reconstructions and MIPs were obtained to evaluate the vascular anatomy. CONTRAST:  63mL OMNIPAQUE IOHEXOL 350 MG/ML SOLN COMPARISON:  07/04/2020, 07/22/2020 FINDINGS: VASCULAR Aorta: The aorta is normal in caliber  and patent throughout. There is increased burden of wall adherent mural thrombus in the infrarenal aorta resulting in approximately 40% stenosis the distal aorta. Celiac: Mild ostial stenosis likely secondary to median arcuate ligament compression with mild poststenotic dilation. Otherwise widely patent. SMA: Patent without evidence of aneurysm, dissection,  vasculitis or significant stenosis. Renals: Single bilateral renal arteries are patent without evidence of aneurysm, dissection, vasculitis, fibromuscular dysplasia or significant stenosis. IMA: Patent without evidence of aneurysm, dissection, vasculitis or significant stenosis. RIGHT Lower Extremity Inflow: Right common iliac stent in place which is widely patent, extending from the aortic bifurcation to the proximal external iliac artery. The internal iliac arteries covered, therefore occluded. The remaining external iliac and common femoral artery patent. Outflow: Common, superficial and profunda femoral arteries and the popliteal artery are patent without evidence of aneurysm, dissection, vasculitis or significant stenosis. Runoff: Patent trifurcation. Abrupt occlusion of the proximal peroneal artery. Abrupt occlusion of the distal anterior tibial artery. Diminutive appearance of the posterior tibial artery at the level of the ankle. LEFT Lower Extremity Inflow: Occlusion of the indwelling left common iliac stent. Patent internal and external iliac arteries. Outflow: Occlusion of the left distal common femoral artery extending into the proximal profunda and superficial femoral artery. Otherwise widely patent superficial femoral artery and popliteal artery. Runoff: Patent trifurcation. The peroneal artery is diminutive distally. Inline flow to the foot via the anterior tibial posterior tibial arteries. Veins: No obvious venous abnormality within the limitations of this arterial phase study. Review of the MIP images confirms the above findings. NON-VASCULAR Lower chest: Lingular mosaic attenuation. Left basilar subsegmental atelectasis. Scattered nodular opacities in the lingula, the largest measuring up to 1.1 cm. Multifocal scattered punctate solid and ground-glass nodules throughout the visualized right lung base. Hepatobiliary: No focal liver abnormality is seen. No gallstones, gallbladder wall thickening, or  biliary dilatation. Pancreas: Unremarkable. No pancreatic ductal dilatation or surrounding inflammatory changes. Spleen: Normal in size without focal abnormality. Adrenals/Urinary Tract: Adrenal glands are unremarkable. Soft tissue density exophytic lesion arising from the medial aspect of the right kidney superior pole, better visualized the comparison study. Kidneys are otherwise normal, without renal calculi, focal lesion, or hydronephrosis. Bladder is unremarkable. Stomach/Bowel: Stomach is within normal limits. Appendix appears normal. No evidence of bowel wall thickening, distention, or inflammatory changes. Lymphatic: No abdominopelvic lymphadenopathy. Reproductive: Prostate is unremarkable. Other: Expected postsurgical changes after right common femoral artery cutdown with minimal fluid about the subcutaneous incision. Musculoskeletal: Significant interval progression of previously visualized soft tissue nodularity and multilobular mass like formation, incompletely visualized, in the left axilla with associated worsening left lateral abdominal in thoracic anasarca extending into the left thigh. No acute osseous abnormality or aggressive appearing osseous lesions. Postsurgical changes after right lower leg medial and lateral compartment fasciotomies without complicating features. IMPRESSION: VASCULAR 1. Acute occlusion of the indwelling left common iliac stent. 2. Increased burden of mural thrombus in the distal abdominal aorta resulting in up to approximately 40% stenosis just proximal to the indwelling kissing iliac stents. 3. Acute occlusion of the distal left common femoral artery extending into the proximal profunda and superficial femoral artery. 4. Embolic occlusion of the left proximal peroneal and distal anterior tibial arteries. NON-VASCULAR 1. Significant interval progression in size of partially visualized left axillary lobular soft tissue mass with associated left trunk anasarca. 2. Scattered  subcentimeter solid and ground-glass nodules in the visualized lung bases, largest in the lingula, concerning for metastatic disease. 3. Postsurgical changes after right femoral cutdown and right lower extremity  mediolateral compartment fasciotomies without complicating features. 4. Indeterminate soft tissue density 1 cm exophytic right superior pole mass. Hemorrhagic cyst is most likely, however, metastasis or synchronous primary neoplasm could appear similarly. These results were called by telephone at the time of interpretation on 07/25/2020 at 9:22 am to provider Dr. Matilde Haymaker, Who verbally acknowledged these results. Ruthann Cancer, MD [facility] Electronically Signed   By: Ruthann Cancer MD   On: 07/25/2020 09:30   CT Code Stroke Cerebral Perfusion with contrast  Result Date: 07/22/2020 CLINICAL DATA:  Metastatic disease to the brain. Left-sided weakness. Speech disturbance. EXAM: CT ANGIOGRAPHY HEAD AND NECK CT PERFUSION BRAIN TECHNIQUE: Multidetector CT imaging of the head and neck was performed using the standard protocol during bolus administration of intravenous contrast. Multiplanar CT image reconstructions and MIPs were obtained to evaluate the vascular anatomy. Carotid stenosis measurements (when applicable) are obtained utilizing NASCET criteria, using the distal internal carotid diameter as the denominator. Multiphase CT imaging of the brain was performed following IV bolus contrast injection. Subsequent parametric perfusion maps were calculated using RAPID software. CONTRAST:  173mL OMNIPAQUE IOHEXOL 350 MG/ML SOLN COMPARISON:  Head CT earlier same day.  MRI 06/15/2020. FINDINGS: CTA NECK FINDINGS Aortic arch: Normal.  No atherosclerotic change. Right carotid system: Common carotid artery widely patent to the bifurcation. Carotid bifurcation is normal without stenosis or irregularity. Cervical ICA widely patent. Left carotid system:  Common carotid artery widely patent to the bifurcation. Carotid bifurcation is normal. Cervical ICA is normal. Vertebral arteries: Both vertebral artery origins are widely patent. Both vertebral arteries appear normal through the cervical region to the foramen magnum. Skeleton: Mid cervical spondylosis. Other neck: No neck mass identified. Upper chest: Hilar and mediastinal lymphadenopathy. Review of the MIP images confirms the above findings CTA HEAD FINDINGS Anterior circulation: Both internal carotid arteries are patent through the skull base and siphon regions. The left anterior circulation is normal. On the right, there is an occluded distal M3 branch in the inferior parietal region. Posterior circulation: Both vertebral arteries widely patent to the basilar. No basilar stenosis. Posterior circulation branch vessels are patent. Patent bilateral posterior communicating arteries. Venous sinuses: Patent and normal. Anatomic variants: None significant. Review of the MIP images confirms the above findings CT Brain Perfusion Findings: ASPECTS: 10 on the initial head CT reading. With the benefit the perfusion imaging, there could be a score of 9 awarded, with slight loss of gray-white differentiation at the inferior right parietal region. CBF (<30%) Volume: 52mL Perfusion (Tmax>6.0s) volume: 34mL Mismatch Volume: 62mL Infarction Location:Inferior right parietal lobe. IMPRESSION: 1. Occlusion of a distal right M3 branch in the inferior right parietal region. 9 mL core infarction with slight loss of gray-white differentiation at the inferior right parietal region. 19 CC region of at risk brain with a mismatch volume of 19 mL. 2. No carotid bifurcation disease. No visible aortic atherosclerosis. 3. Hilar and mediastinal lymphadenopathy consistent with the diagnosis of metastatic disease. Electronically Signed   By: Nelson Chimes M.D.   On: 07/22/2020 22:50   DG Chest Port 1 View  Result Date: 07/22/2020 CLINICAL DATA:   Weakness EXAM: PORTABLE CHEST 1 VIEW COMPARISON:  07/04/2020 FINDINGS: There is cardiomegaly. The previously demonstrated axillary and hilar adenopathy is better visualized on prior CT scans. Both lungs are clear. The visualized skeletal structures are unremarkable. There is a well-positioned right-sided Port-A-Cath. IMPRESSION: No definite acute cardiopulmonary process. Electronically Signed   By: Jamie Kato.D.  On: 07/22/2020 22:52   DG Ang/Ext/Uni/Or Left  Result Date: 07/26/2020 CLINICAL DATA:  LEFT ILIOFEMORAL EMBOLECTOMYTHROMBECTOMY ILIAC ARTERYINSERTION OF COMMON ILIAC STENTAORTOGRAM with Angiogram EXAM: LEFT ANG/EXT/UNI/ OR CONTRAST:  Not specified FLUOROSCOPY TIME:  Fluoroscopy Time:  7 minutes 13 seconds Radiation Exposure Index (if provided by the fluoroscopic device): 64 mGy Number of Acquired Spot Images: 0 COMPARISON:  CT angiography abdominal aorta with runoff 07/24/2020 FINDINGS: Submitted images demonstrate bilateral common iliac artery stents, with complete to near complete occlusion of the left common iliac artery stent. Right common and external iliac arteries are patent. Balloon plasty of the left common iliac artery stent is seen. Final angiogram demonstrates patency of the left common iliac artery with continued mild narrowing at the origin. Please refer to dictation of the procedure for complete report. IMPRESSION: Distal aortic and bilateral iliac angiogram as above. Electronically Signed   By: Miachel Roux M.D.   On: 07/26/2020 08:08   CT HEAD CODE STROKE WO CONTRAST  Result Date: 07/22/2020 CLINICAL DATA:  Code stroke. Left-sided weakness. Metastatic disease EXAM: CT HEAD WITHOUT CONTRAST TECHNIQUE: Contiguous axial images were obtained from the base of the skull through the vertex without intravenous contrast. COMPARISON:  01/05/2020.  MRI 06/15/2020 FINDINGS: Brain: Known bilateral temporal lobe metastatic disease. Subtle architectural distortion of the inferior  temporal lobe on the right. Some volume loss and distortion of the inferior and anterior left temporal lobe. Other tiny metastases previously seen are not specifically appreciable. There is no sign of acute infarction, hydrocephalus, hemorrhage or extra-axial fluid collection. Vascular: No abnormal vascular finding. Skull: Normal Sinuses/Orbits: Sinuses are clear.  Orbits are negative. Other: None ASPECTS ([place] Stroke Program Early CT Score) - Ganglionic level infarction (caudate, lentiform nuclei, internal capsule, insula, M1-M3 cortex): 7 - Supraganglionic infarction (M4-M6 cortex): 3 Total score (0-10 with 10 being normal): 10 IMPRESSION: 1. Known bilateral temporal lobe metastatic disease. Subtle architectural distortion of the inferior temporal lobe on the right. Some volume loss and distortion of the inferior and anterior left temporal lobe. Other tiny metastases previously seen by MRI are not specifically appreciable. No acute finding by CT. 2. ASPECTS is 10. 3. These results were communicated to Dr. Leonel Ramsay at 10:19 pmon 2/19/2022by text page via the [facility] messaging system. Electronically Signed   By: Nelson Chimes M.D.   On: 07/22/2020 22:20     Results/Tests Pending at Time of Discharge: none  Discharge Medications:  Allergies as of 07/30/2020   No Known Allergies     Medication List    STOP taking these medications   diltiazem 120 MG 24 hr capsule Commonly known as: TIAZAC Replaced by: diltiazem 180 MG 24 hr capsule   Entresto 24-26 MG Generic drug: sacubitril-valsartan   furosemide 20 MG tablet Commonly known as: LASIX     TAKE these medications   acetaminophen 500 MG tablet Commonly known as: TYLENOL Take 1,000 mg by mouth every 6 (six) hours as needed for mild pain or moderate pain.   amiodarone 200 MG tablet Commonly known as: PACERONE Take 200 mg by mouth daily.   aspirin 81 MG chewable tablet Chew 81 mg by mouth daily.   benzonatate 100 MG capsule Commonly  known as: TESSALON Take 1 capsule by mouth three times daily as needed for cough   cyclobenzaprine 10 MG tablet Commonly known as: FLEXERIL Take 1 tablet (10 mg total) by mouth 3 (three) times daily as needed for muscle spasms.   dexamethasone 2 MG tablet Commonly known as: DECADRON  Take 0.5 tablets (1 mg total) by mouth daily for 8 days. 4mg  x 4 days, 2 mg x 7 days, 1mg  x 7 days What changed:   how much to take  when to take this   digoxin 0.125 MG tablet Commonly known as: LANOXIN Take 0.0625 mg by mouth daily.   diltiazem 180 MG 24 hr capsule Commonly known as: CARDIZEM CD Take 1 capsule (180 mg total) by mouth daily. Start taking on: July 31, 2020 Replaces: diltiazem 120 MG 24 hr capsule   enoxaparin 80 MG/0.8ML injection Commonly known as: LOVENOX Inject 0.8 mLs (80 mg total) into the skin every 12 (twelve) hours. What changed:   medication strength  how much to take  when to take this   gabapentin 100 MG capsule Commonly known as: NEURONTIN Take 100 mg by mouth 3 (three) times daily.   lidocaine 5 % Commonly known as: LIDODERM Place 1 patch onto the skin See admin instructions. Apply to the most painful area for up to 12 hours in a 24 hour period   melatonin 3 MG Tabs tablet Take 3 mg by mouth at bedtime.   metoprolol tartrate 50 MG tablet Commonly known as: LOPRESSOR Take 1 tablet (50 mg total) by mouth 2 (two) times daily.   ondansetron 4 MG disintegrating tablet Commonly known as: Zofran ODT Take 1 tablet (4 mg total) by mouth every 4 (four) hours as needed for nausea or vomiting.   Oxycodone HCl 10 MG Tabs Take 10 mg by mouth every 6 (six) hours as needed (pain).   prochlorperazine 10 MG tablet Commonly known as: COMPAZINE Take 10 mg by mouth every 6 (six) hours as needed for nausea or vomiting.   Senokot S 8.6-50 MG tablet Generic drug: senna-docusate Take 2 tablets by mouth 2 (two) times daily.            Durable Medical  Equipment  (From admission, onward)         Start     Ordered   07/30/20 1034  For home use only DME Bedside commode  Once       Question Answer Comment  Patient needs a bedside commode to treat with the following condition Melanoma [facility])   Patient needs a bedside commode to treat with the following condition Fatigue      07/30/20 1034   07/30/20 1034  For home use only DME 4 wheeled rolling walker with seat  Once       Question Answer Comment  Patient needs a walker to treat with the following condition Melanoma ([place])   Patient needs a walker to treat with the following condition Fatigue      07/30/20 1034   07/30/20 1033  For home use only DME lightweight manual wheelchair with seat cushion  Once       Comments: Patient suffers from pain from cancer which impairs their ability to perform daily activities like bathing, dressing, grooming, and toileting in the home.  A walker will not resolve  issue with performing activities of daily living. A wheelchair will allow patient to safely perform daily activities. Patient is not able to propel themselves in the home using a standard weight wheelchair due to endurance and general weakness. Patient can self propel in the lightweight wheelchair. Length of need Lifetime. Accessories: elevating leg rests (ELRs), wheel locks, extensions and anti-tippers.   07/30/20 1034           Discharge Care Instructions  (From admission, onward)  Start     Ordered   07/30/20 0000  Discharge wound care:       Comments: Per wound care   07/30/20 1010          Discharge Instructions: Please refer to Patient Instructions section of EMR for full details.  Patient was counseled important signs and symptoms that should prompt return to medical care, changes in medications, dietary instructions, activity restrictions, and follow up appointments.   Follow-Up Appointments:  Follow-up Information    [facility] Follow up.   Why:  (formerly [facility])- referral made for outpt Palliative care needs- they will contact you post discharge for follow up Contact information: [place], [place] Follow up.   Why: [place] will contact you with Registered Nurse, Physical Therapy and Occupational Therpay needs.  Contact information: [address] [place] [address] [phone]        [facility] Follow up.   Why: Wound Vac- spoke to agency and the wound vac will be delivered to the floor prior to transition home.  Contact information: [place] [address] [phone]        [facility] Follow up.   Why: Rolling Walker to be delivered to the room  Contact information: [address] [place] [address] [phone]               Matilde Haymaker, MD 07/30/2020, 3:54 PM PGY-3, [place]

## 2020-07-30 NOTE — Care Management (Cosign Needed)
    Durable Medical Equipment  (From admission, onward)         Start     Ordered   07/30/20 1034  For home use only DME Bedside commode  Once       Question Answer Comment  Patient needs a bedside commode to treat with the following condition Melanoma [facility])   Patient needs a bedside commode to treat with the following condition Fatigue      07/30/20 1034   07/30/20 1034  For home use only DME 4 wheeled rolling walker with seat  Once       Question Answer Comment  Patient needs a walker to treat with the following condition Melanoma ([place])   Patient needs a walker to treat with the following condition Fatigue      07/30/20 1034   07/30/20 1033  For home use only DME lightweight manual wheelchair with seat cushion  Once       Comments: Patient suffers from pain from cancer which impairs their ability to perform daily activities like bathing, dressing, grooming, and toileting in the home.  A walker will not resolve  issue with performing activities of daily living. A wheelchair will allow patient to safely perform daily activities. Patient is not able to propel themselves in the home using a standard weight wheelchair due to endurance and general weakness. Patient can self propel in the lightweight wheelchair. Length of need Lifetime. Accessories: elevating leg rests (ELRs), wheel locks, extensions and anti-tippers.   07/30/20 1034

## 2020-07-30 NOTE — Progress Notes (Signed)
Family Medicine Teaching Service Daily Progress Note Intern Pager: [phone]  Patient name: Adam Bender. Medical record number: [id number] Date of birth: [birth year] Age: 60 y.o. Gender: male  Primary Care Provider: Patient, No Pcp Per Consultants: Neurology, Vascular, Heme/Onc, Palliative Code Status: Full  Pt Overview and Major Events to Date:  02/19- Admitted  02/22-Left Embolectomy with Fasciotomy  Assessment and Plan: Adam Benderis a 60 y.o.male.PMH is significant formetastatic malignant melanoma, brain metastases, atrial fibrillation with recent venous and arterial clot s/p right lower extremity thrombectomy and fasciotomy on 07/04/2020 and repeat thrombectomy on 07/07/2020.  Goals ofCare Progressive metastatic melanoma.  Overall poor prognosis.  Palliative care will continue to follow in the outpatient setting.  S/p Left Iliac and Femoral Thrombectomy 2/22 History of Recurrent Right Iliac Artery Thrombus Stable.  Per wound care, wound VAC may not be necessary at this point although we do not have any physician who is managing this wound present at [facility].  This wound is being managed by the [facility] vascular surgeon.  This is upholding discharge because we need to supply a new wound VAC machine that Adam Bender use at home.  All other problems are stable and he is appropriate for discharge home, where simply awaiting equipment for a wound VAC that Bender be used at home. -Lovenox 80 mg twice daily, continue at home  Metastatic Melanomawith Brain, Lung, Axillary Involvement CTA performed this admission demonstrates progression of his known metastases.  We will continue to follow with oncology and palliative care in the outpatient setting. -Follow up outpatient with Onc for immunotherapy treatment and radiation. Treatment is palliative and not curative.  Patient and family aware of this. -steroid taper per outpatient Onc.  Left-Sided Weakness 2/2  Acute CVA Stable.  Improving. -Home health PT/OT  Atrial Fibrillation He continues to have A. fib without RVR. -Lovenox as above -Follow up outpatient cardiology  Hyperglycemiain the Setting of No Diabetes Mellitus Diagnosis (A1c: 6.4%) Blood glucose 120-170 in the past 24 hours.  Elevated in the setting of steroid taper.  Plan to send home without additional glycemic control.  He will Bender continue his steroid taper and take his last dose of steroid on 3/7. -Continue sSSI and CBG montioring inpatient  Leukocytosis, stable Stable.  On steroids.  -Continue to monitor  HFrEF, Previous EF 50% in 2021 Stable.  No fluid overload on exam today.  Continue to hold Entresto and Lasix.  Plan to send home without Entresto or Lasix.   FEN/GI:Regular diet PPx: Heparin drip, pharm to dose/adjust rate  Status is: Inpatient  Remains inpatient appropriate because:Ongoing diagnostic testing needed not appropriate for outpatient work up   Dispo:  Patient From: Home  Planned Disposition: Home with Health Care Svc  Medically stable for discharge: No      Subjective:  No acute events overnight.  He notes that his toes continue to feel cold.  His leg pain is well controlled on his current pain regimen.  No new concerns.  Objective: Temp:  [98 F (36.7 C)-98.6 F (37 C)] 98.1 F (36.7 C) (02/27 0400) Pulse Rate:  [79-99] 87 (02/27 0848) Resp:  [15-20] 16 (02/27 0000) BP: (101-116)/(65-79) 101/79 (02/27 0000) SpO2:  [94 %-96 %] 94 % (02/27 0400)  Physical Exam: General: Alert and cooperative and appears to be in no acute distress.  Lying comfortably in bed. Cardio: Normal S1 and S2, no S3 or S4. Rhythm is regular. No murmurs or rubs.   Pulm: Clear  to auscultation bilaterally, no crackles, wheezing, or diminished breath sounds. Normal respiratory effort Abdomen: Bowel sounds normal. Abdomen soft and non-tender.  Extremities: No peripheral edema. Warm/ well perfused.  Strong radial  pulses.  Wound VAC in place on right lower leg. Neuro: Cranial nerves grossly intact.  Continued weakness and difficulty using left arm.   Laboratory: Recent Labs  Lab 07/28/20 0053 07/29/20 0158 07/30/20 0029  WBC 19.2* 17.3* 15.0*  HGB 8.8* 9.0* 9.4*  HCT 26.7* 28.2* 28.0*  PLT 258 305 326   Recent Labs  Lab 07/26/20 0309 07/27/20 0358 07/28/20 0053  NA 131* 131* 132*  K 4.0 4.1 4.2  CL 101 101 102  CO2 22 21* 21*  BUN 14 11 13   CREATININE 0.79 0.65 0.62  CALCIUM 7.6* 8.2* 8.2*  GLUCOSE 129* 129* 108*      Imaging/Diagnostic Tests: No results found.  Matilde Haymaker, MD 07/30/2020, 11:41 AM PGY-3, [place] Intern pager: [phone], text pages welcome

## 2020-07-30 NOTE — TOC Progression Note (Signed)
Transition of Care (TOC) - Progression Note    Patient Details  Name: Adam Bender. MRN: [id number] Date of Birth: 07/26/1960  Transition of Care [facility]) CM/SW Contact  Graves-Bigelow, Ocie Cornfield, RN Phone Number: 07/30/2020, 2:37 PM  Clinical Narrative: Plan for transition home today with home health services for physical therapy, occupational therapy, and registered nurse for wound vac therapy. Transition of care team was not aware of wound vac management for home- team had not received a consult. Once patient received discharge orders this am, that is when the writer noticed that the patient had a wound vac. Case Manager reached out to the wife this am and she stated that the wound vac that was placed on the patient at [facility] had been returned Monday while the patient was in the ED. Wife could not remember the name of the agency that supplied the wound vac; however she had a telephone number. Wife had called the agency and then the Case Manager asked for the number- it was to [place]. Case Manager called [facility] and asked if the wound vac could be routed to this location. No ETA was provided. Wound Vac will be delivered to the room today-Staff RN aware to switch the [facility] out- MD and wife is aware. Pearletha Forge verified that the [place] the patient is active with- is [facility]. Case Manager called [facility] and made them aware that the patient will transition home today with a wound vac. [facility] will retrieve the orders via Epic. [facility] will contact the family regarding visit times. MD notified to update orders for wound vac changes to be placed on the order. A rolling walker was ordered via [employer] to be delivered to the room- [employer] called Case Manager back and stated that the wife declined the wheelchair. Wife is in the room and will provide transportation home via private vehicle.       Expected Discharge Plan: [place] Barriers to Discharge: No Barriers  Identified  Expected Discharge Plan and Services Expected Discharge Plan: [place] In-house Referral: Hospice / Palliative Care Discharge Planning Services: CM Consult Post Acute Care Choice: [place] arrangements for the past 2 months: Single Family Home Expected Discharge Date: 07/30/20               DME Arranged: Gilford Rile rolling (wound vac was ordered via [facility].) DME Agency: [facility] (declined wheelchair- [facility] to deliver the wound vac to the hospital) Date DME Agency Contacted: 07/30/20 Time DME Agency Contacted: 21 Representative spoke with at DME Agency: [place]: RN,Disease Management,PT,OT [place] Agency: [place] Date [place]: 07/30/20 Time [place]: 1400 Representative spoke with at [place]: Malachy Mood  Readmission Risk Interventions No flowsheet data found.
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
## 2020-07-30 NOTE — Plan of Care (Signed)
Progressing, will continue to monitor.  

## 2020-07-31 ENCOUNTER — Telehealth: Payer: Self-pay | Admitting: Oncology

## 2020-07-31 ENCOUNTER — Inpatient Hospital Stay: Payer: 59 | Attending: Oncology

## 2020-07-31 ENCOUNTER — Other Ambulatory Visit: Payer: Self-pay | Admitting: Oncology

## 2020-07-31 DIAGNOSIS — I959 Hypotension, unspecified: Secondary | ICD-10-CM

## 2020-07-31 DIAGNOSIS — C438 Malignant melanoma of overlapping sites of skin: Secondary | ICD-10-CM

## 2020-07-31 NOTE — Telephone Encounter (Signed)
Per 2/28 Staff Msg, patient scheduled for 3/3 Labs 9:30 am - Follow Up 10:00 am.  Patient aware of Appt

## 2020-08-01 ENCOUNTER — Encounter: Payer: Self-pay | Admitting: *Deleted

## 2020-08-01 NOTE — Progress Notes (Signed)
[place]  [address] [place],  [place]  [address] [phone]  Clinic Day:  08/03/2020  Referring physician: Eppie Gibson, MD  This document serves as a record of services personally performed by Hosie Poisson, MD. It was created on their behalf by [facility] E, a trained medical scribe. The creation of this record is based on the scribe's personal observations and the provider's statements to them.  CHIEF COMPLAINT:  CC: Left axillary pain and increasing melanoma skin lesions.  Current Treatment:  Will refer him to Dr. Orlene Erm to discuss palliative radiation to the left axilla   HISTORY OF PRESENT ILLNESS:  Adam Bender. is a 60 y.o. 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 Michele Mcalpine.  He had several areas of suspicious skin lesions develop, which seem to have worsened.  He had not been following with dermatology routinely.  He presented to the [facility] Emergency Department on August 4th due to a 2 month history of headaches.  These occur 3-4 times per week, usually of the right temporal area, but occasionally the left temporal as well.  The headaches are associated with vertigo upon standing or when looking down, nausea and fatigue.  He denied any visual acuity changes, numbness or tingling.  CT head imaging revealed subcortical white matter edema involving the left temporal lobe concerning for infarction or underlying neoplasm.  Correlating MRI head confirmed homogeneous enhancement at the periphery of the anterior left temporal lobe measuring 1.9 x 1.5 x 2.4 cm.  There is also adjacent parenchymal edema.  Additional periphery of the inferior right temporal lobe measures 1.0 x 1.5 x 0.5 cm also with adjacent parenchymal edema.  A 4 mm focus of enhancement is present at the periphery of the  right temporal lobe.  He had numerous black skin lesions scattered over his body which are enlarging.  CT chest, abdomen and pelvis from August 11th revealed enlarged left axillary and subpectoral lymph nodes measuring up to 2.0 x 1.9 cm.  Multiple tiny bilateral pulmonary nodules, nonspecific, were also observed, the largest measuring 3 mm.  There was no evidence of metastatic disease in the abdomen or pelvis.  He was seen by Dr. Lilia Pro and underwent biopsy and excision of his back and lip lesions on August 10th.  The pathology from these procedures revealed malignant melanoma, Clark level IV, with peripheral and deep margins involved.  He is positive for BRAF.   He received his first dose of immunotherapy with ipilimumab/nivolumab IV every 3 weeks on September 17th and tolerated this well.  However, he presented to the emergency department later that day due to persistent atrial fibrillation and high fever.  He was placed on medications and started on low-dose anticoagulation.  He was admitted with right lower lobe pneumonia and treated accordingly with cefepime and doxycycline.  He was discharged on September 23rd on Cardizem 30 mg TID, Entresto 24/26 BID, digoxin 0.125 mg daily, metoprolol 50 mg BID, Eliquis 2.5 mg BID, doxycycline 100 mg BID #10, and Farxiga 10 mg daily.  He has had some increase in the skin lesions while on immunotherapy, which we feel was possibly pseudoprogression.  Some of the lesions are decreasing and scabbing over.  CT chest from December 27th revealed interval growth of bulky heterogeneously enhancing left axillary and left retropectoral lymphadenopathy, as well as new left  hilar lymphadenopathy.  There is also new subcentimeter pulmonary nodules in the lungs bilaterally, indeterminate for pulmonary metastases, and new small subcutaneous soft tissue nodule in the ventral upper left chest wall, suspicious for subcutaneous metastasis.  Indistinct clustered subcapsular foci of  hyperenhancement in the peripheral inferior right liver lobe, not convincingly changed, indeterminate, was also seen.  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  I cannot rule out progression of his melanoma, but time will tell.  He was seen on January 10th for pain and swelling of the left axillary nodes.  Since he had leukocytosis and tenderness, he was placed on Keflex.  When I saw him for follow up, he was already improving.  MRI head on January 13th revealed mixed treatment response with slight enlargement of the right inferior temporal lobe lesion now measuring 17 x 11 mm, and new right frontal operculum lesion measuring 2 mm and left frontal leptomeningeal enhancement.  There is interval resolution of the leptomeningeal enhancement the left postcentral sulcus.  Dr. Isidore Moos was planning to radiate the left axilla and administer stereotactic radiation to the brain lesions.   INTERVAL HISTORY:  He is here for follow up after presenting to the hospital in early February due to right leg pain, which continued to worsen.  CTA confirmed thrombus to the right iliac proximal artery which occurred despite low dose apixaban 2.5 mg daily.  Heparin drip was initiated and the patient was transferred to [facility] for vascular surgery.  He underwent thrombectomy x3 with fasciotomy on February 1st.  CTA from February 4th showed recurrent occlusive thrombus and repeat thrombectomy was pursued.  The surgical wound was slow to heal and so he had a wound vac placed.  He was transitioned to Lovenox 60 mg Q12 as well as aspirin 81 mg daily.  He received palliative radiation to the left axilla for 4 treatments while admitted and did have good improvement in the mass.  He was later deemed stable and discharged.  On February 19th he presented to [facility] emergency department due to facial droop, slurred speech, left hemiparesis and was  admitted for CVA work up.  CT head was negative for acute finding, but CTA/CTP showed right M3 branch occlusion with associated ischemia. MRI of the brain showed multiple acute right MCA territory infarcts, largest in the temporoparietal region. There was also minimally increased size of 4 mm right frontal operculum metastasis.  He underwent left iliofemoral embolectomy on February 22nd and Lovenox was increased to 80 mg BID which he continues as well as aspirin 81 mg daily.    He comes into the clinic in a wheelchair today and looks quite poorly.  He still has a wound vac on the right lower leg, and he has home health nurses coming out 3 times per week.  He has a mild residual left hemiparesis. The left axillary mass has started to grow back and he has worsening pain of the area.  He has been using oxycodone 10 mg every 4 hours as needed and I will refill this today.  He continues dexamethasone 1 mg daily at this time and is slowly tapering.  His hemoglobin has decreased from 14.2 to 9.5, his platelet count has increased to 429,000, and his white count is normal.  He had two transfusions, one at [facility] and one at [facility] while he was admitted.  Chemistries are unremarkable except for a sodium of  128, a corrected calcium of 11.8, an albumin of 2.9, and an alkaline phosphatase of 378.  His  appetite is poor, and he has not been eating well.  He denies fever, chills or other signs of infection.  He denies nausea, vomiting, bowel issues, or abdominal pain.  He denies sore throat, cough, dyspnea, or chest pain.  REVIEW OF SYSTEMS:  Review of Systems  Constitutional: Positive for appetite change (poor) and diaphoresis. Negative for chills, fatigue, fever and unexpected weight change.  HENT:  Negative.   Eyes: Negative.   Respiratory: Negative.  Negative for chest tightness, cough, hemoptysis, shortness of breath and wheezing.   Cardiovascular: Negative.  Negative for chest pain, leg swelling and palpitations.   Gastrointestinal: Negative.  Negative for abdominal distention, abdominal pain, blood in stool, constipation, diarrhea, nausea and vomiting.  Endocrine: Negative.   Genitourinary: Negative.  Negative for difficulty urinating, dysuria, frequency and hematuria.   Musculoskeletal: Positive for gait problem (due to left hemiparesis). Negative for arthralgias, back pain, flank pain and myalgias.       Pain of the left axilla  Skin: Negative.   Neurological: Positive for extremity weakness and gait problem (due to left hemiparesis). Negative for dizziness, headaches, light-headedness, numbness, seizures and speech difficulty.       Left hemiparesis  Hematological: Negative.   Psychiatric/Behavioral: Negative.  Negative for depression and sleep disturbance. The patient is not nervous/anxious.   All other systems reviewed and are negative.   VITALS:  Blood pressure (S) (!) 84/52, pulse 81, temperature 98.4 F (36.9 C), temperature source Oral, resp. rate 18, height 5' 2.99" (1.6 m), SpO2 96 %.  Wt Readings from Last 3 Encounters:  07/25/20 162 lb 8 oz (73.7 kg)  07/03/20 162 lb 8 oz (73.7 kg)  06/30/20 162 lb 9.6 oz (73.8 kg)    Body mass index is 28.79 kg/m.  Performance status (ECOG): 2 - Symptomatic, <50% confined to bed  PHYSICAL EXAM:  Physical Exam Constitutional:      General: He is not in acute distress.    Appearance: He is normal weight. He is ill-appearing and diaphoretic.  HENT:     Head: Normocephalic and atraumatic.  Eyes:     General: No scleral icterus.    Extraocular Movements: Extraocular movements intact.     Conjunctiva/sclera: Conjunctivae normal.     Pupils: Pupils are equal, round, and reactive to light.  Cardiovascular:     Rate and Rhythm: Regular rhythm. Tachycardia present.     Pulses: Normal pulses.     Heart sounds: Normal heart sounds. No murmur heard. No friction rub. No gallop.   Pulmonary:     Effort: Pulmonary effort is normal. No respiratory  distress.     Breath sounds: Normal breath sounds.  Abdominal:     General: Bowel sounds are normal. There is no distension.     Palpations: Abdomen is soft. There is no hepatomegaly, splenomegaly or mass.     Tenderness: There is no abdominal tenderness.  Musculoskeletal:        General: Normal range of motion.     Cervical back: Normal range of motion and neck supple.     Right lower leg: No edema.     Left lower leg: No edema.     Comments: He has 1+ lymphedema of the left upper extremity  Lymphadenopathy:     Cervical: No cervical adenopathy.     Comments: He still has a large mass inferior to the left axilla which  is hard and mildly tender.     Skin:    General: Skin is warm.     Comments: Skin lesions remain stable of the scalp and forehead but the one of the left forearm looks enlarged.    Neurological:     General: No focal deficit present.     Mental Status: He is alert and oriented to person, place, and time.     Comments: He has a left hemiparesis.    Psychiatric:        Mood and Affect: Mood normal.        Behavior: Behavior normal.        Thought Content: Thought content normal.        Judgment: Judgment normal.     LABS:   CBC Latest Ref Rng & Units 07/30/2020 07/29/2020 07/28/2020  WBC 4.0 - 10.5 K/uL 15.0(H) 17.3(H) 19.2(H)  Hemoglobin 13.0 - 17.0 g/dL 9.4(L) 9.0(L) 8.8(L)  Hematocrit 39.0 - 52.0 % 28.0(L) 28.2(L) 26.7(L)  Platelets 150 - 400 K/uL 326 305 258   CMP Latest Ref Rng & Units 07/28/2020 07/27/2020 07/26/2020  Glucose 70 - 99 mg/dL 108(H) 129(H) 129(H)  BUN 6 - 20 mg/dL _0 Creatinine 0.61 - 1.24 mg/dL 0.62 0.65 0.79  Sodium 135 - 145 mmol/L 132(L) 131(L) 131(L)  Potassium 3.5 - 5.1 mmol/L 4.2 4.1 4.0  Chloride 98 - 111 mmol/L 102 101 101  CO2 22 - 32 mmol/L 21(L) 21(L) 22  Calcium 8.9 - 10.3 mg/dL 8.2(L) 8.2(L) 7.6(L)  Total Protein 6.5 - 8.1 g/dL - - -  Total Bilirubin 0.3 - 1.2 mg/dL - - -  Alkaline Phos 38 - 126 U/L - - -  AST 15 - 41  U/L - - -  ALT 0 - 44 U/L - - -     STUDIES:   He underwent a CTA abdominal aorta with ileofemoral runoff 07/04/2020 showing: 1. Arterial thrombus at the aortic bifurcation with complete thrombosis of the proximal right common iliac, proximal right external iliac, and proximal right internal iliac arteries. Distal reconstitution of the right internal and external iliac arteries is noted with decreased flow. Opacification of the arterial system in the right lower extremity is non-existent at the level of the popliteal artery and distally. 2. Normal left 3 vessel runoff. 3. No aortic aneurysm or dissection.  NON-VASCULAR  1. Indeterminate subcentimeter soft tissue densities along the left lateral chest wall with associated dermal thickening and subcutaneus soft tissue edema in a patient with known melanoma. 2. Indeterminate eccentric lytic lesion within the distal left femoral diaphysis that could represent a metastasis in a patient known melanoma.  HISTORY:   Allergies: No Known Allergies  Current Medications: Current Outpatient Medications  Medication Sig Dispense Refill  . acetaminophen (TYLENOL) 500 MG tablet Take 1,000 mg by mouth every 6 (six) hours as needed for mild pain or moderate pain.    Marland Kitchen amiodarone (PACERONE) 200 MG tablet Take 200 mg by mouth daily.    . ASPIRIN 81 81 MG chewable tablet CHEW AND SWALLOW 1 TABLET BY MOUTH ONCE DAILY    . benzonatate (TESSALON) 100 MG capsule Take 1 capsule by mouth three times daily as needed for cough 90 capsule 0  . cyclobenzaprine (FLEXERIL) 10 MG tablet Take 10 mg by mouth 3 (three) times daily as needed for muscle spasms.    Marland Kitchen dexamethasone (DECADRON) 2 MG tablet Take 0.5 tablets (1 mg total) by mouth daily for 8 days. 65m x 4  days, 2 mg x 7 days, 55m x 7 days 4 tablet 0  . digoxin (LANOXIN) 0.125 MG tablet Take 0.0625 mg by mouth daily.    .Marland Kitchendiltiazem (CARDIZEM CD) 180 MG 24 hr capsule Take 1 capsule (180 mg total) by mouth daily. 30  capsule 0  . enoxaparin (LOVENOX) 80 MG/0.8ML injection Inject 0.8 mLs (80 mg total) into the skin every 12 (twelve) hours. 48 mL 0  . gabapentin (NEURONTIN) 100 MG capsule Take 100 mg by mouth 3 (three) times daily.    .Marland Kitchenlidocaine (LIDODERM) 5 % Place 1 patch onto the skin See admin instructions. Apply to the most painful area for up to 12 hours in a 24 hour period    . melatonin 3 MG TABS tablet Take 3 mg by mouth at bedtime.    . metoprolol tartrate (LOPRESSOR) 50 MG tablet Take 1 tablet (50 mg total) by mouth 2 (two) times daily. 60 tablet 5  . ondansetron (ZOFRAN ODT) 4 MG disintegrating tablet Take 1 tablet (4 mg total) by mouth every 4 (four) hours as needed for nausea or vomiting. 30 tablet 5  . Oxycodone HCl 10 MG TABS Take 10 mg by mouth every 6 (six) hours as needed (pain).    . prochlorperazine (COMPAZINE) 10 MG tablet Take 10 mg by mouth every 6 (six) hours as needed for nausea or vomiting.    .Donavan BurnetS 8.6-50 MG tablet Take 2 tablets by mouth 2 (two) times daily.    .Marland KitchenMEKINIST 2 MG tablet Take 2 mg by mouth daily. Have not started yet as of 08/03/20    . TAFINLAR 75 MG capsule Take 150 mg by mouth 2 (two) times daily. Have not started yet as of 08/03/20     No current facility-administered medications for this visit.     ASSESSMENT & PLAN:   Assessment/Plan: 1. History of melanoma of the right upper abdominal wall in 2011, stage IA. This was treated with resection/wide excision with Dr. KMichele Mcalpine  This area remains without evidence of recurrence.    2.  Enhancement of the bilateral temporal lobes as seen on MRI imaging.  Most recent scan from January revealed enlarged right inferior temporal lobe lesion now measuring 17 x 11 mm, and new right frontal operculum lesion measuring 2 mm and left frontal leptomeningeal enhancement.  There is interval resolution of the leptomeningeal enhancement the left postcentral sulcus.  Dr.Sarah SLanell Personshad planned stereotactic radiation, but this is on  hold for now in view of his many medical problems.   3.  Enlarged left axillary and subpectoral metastatic lymph nodes measuring up to 2.7 cm.  These became more inflamed with a leukocytosis, so I treated him with Keflex and he initially improved.  However, the adenopathy has now worsened, and is painful.  This became larger and more painful and he has received 4 treatments of radiation.  We will refer him to Dr. POrlene Erm for more palliative radiation of this area.  4.  Innumerable dark and pedunculated moles all over the body, which have worsened over the past few months, and clearly appear consistent with metastatic melanoma.  The largest measures about 3 cm of the middle back.  He has undergone biopsy and excision of two of these. We have stopped immunotherapy with nivolumab, and plan to pursue BRAF mutation targeted therapyl.  However, his performance status has declined greatly and we don't know that he will be healthy enough to pursue further treatment.  We discussed comfort  measures today.  5.  Multiple tiny bilateral pulmonary nodules, nonspecific.  These are likely benign, but could represent early metastatic disease.  We will continue to monitor.    6.  Positive BRAF mutation.  We were planning to switch him to oral therapy with trametinib and dabrafenib but with his current performance status, I advised that he hold off on starting this therapy.  7. Atrial fibrillation.  This is more irregular and rapid today.  He is on medications and low dose anticoagulation and follows with Dr. Bettina Gavia.  Cardiomyopathy.  He has been started on Entresto.    8. Right lower lobe pneumonia, resolved.   9.  Mediastinal and left axillary adenopathy with pulmonary nodules which could represent a sarcoidosis-like syndrome, but I cannot rule out progressive melanoma.  10.  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  He underwent thrombectomy  with fasciotomy on February 1st.  CTA from February 4th showed recurrent occlusive thrombus and repeat thrombectomy was pursued.  He is currently on Lovenox 80 mg Q12 as well as aspirin 81 mg daily.    11.  Nonhealing wound of the right lower leg requiring a wound vac at present.  12.  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   He underwent left iliofemoral embolectomy on February 22nd and as above, continues anticoagulation therapy.  He still has left hemiparesis.    13.  Severe hypotension and dehydration.  We will have him go to the infusion center for supportive care and IV fluids.  14.  Hypercalcemia, likely from malignancy.  We will give him IV hydration along with IV zoledronic acid.  15.  Worsening prognosis and likely inability to tolerate further aggressive treatment.  We have discussed comfort measures, hospice care and code status.  He still wishes to be a full code at this time, but I think is slowly leaning towards DNR status.  We will give him more time to think this over.  Plan: He is doing quite poorly after his multiple complicated issues over the past couple of months. Currently, I doubt he could tolerate oral therapy and so we will hold off on starting this.  At this time, I recommend that he continue with palliative radiation therapy of the left axilla as he had good improvement after the 4 treatments while at [facility].  He is in agreement and wishes to proceed with treatment as long as he is able, and so we will plan to refer him to Dr. Orlene Erm for consultation tomorrow morning.  Due to his severe hypotension and hypercalcemia, we will send him to the infusion center for IV fluids, zoledronic acid and supportive care.  We will plan to hold off on palliative radiation to the brain with Dr. Isidore Moos of [facility] due to his general condition.  If the patient returns to baseline or improves significantly, then we can consider initiating oral therapy or proceeding with palliative radiation to the brain.  The  patient and his wife understand the plans discussed today and are in agreement with them.  They know to contact our office if his develops concerns prior to his next appointment.   I provided 30 minutes of face-to-face time during this this encounter and > 50% was spent counseling as documented under my assessment and plan.    Derwood Kaplan, MD [facility] [address] [place] [address] Dept: [phone] Dept Fax:  Carrizo, Rita Ohara, am acting as scribe for Derwood Kaplan, MD  I have reviewed this report as typed by the medical scribe, and it is complete and accurate.

## 2020-08-02 NOTE — Progress Notes (Addendum)
°                                                °  ° °  Received Palliative Care referral on 08/01/20 after a [facility] Tumor Board Meeting.  Connected with wife [name] by phone.  Unfortunately, b/c pt is not in the [facility], he is not eligible for [facility] home-based Palliative Care.  Had a wonderful discussion with [name] and shared Hospice Services with her.  She has our phone number to call if pt needs our services in the future. Thank you for this referral.  It would be our pleasure to serve Mr. Mccullars if we can be of assistance in the future.   Wynetta Fines, RN

## 2020-08-03 ENCOUNTER — Other Ambulatory Visit: Payer: Self-pay | Admitting: Hematology and Oncology

## 2020-08-03 ENCOUNTER — Other Ambulatory Visit: Payer: Self-pay

## 2020-08-03 ENCOUNTER — Encounter: Payer: Self-pay | Admitting: Oncology

## 2020-08-03 ENCOUNTER — Other Ambulatory Visit: Payer: Self-pay | Admitting: Oncology

## 2020-08-03 ENCOUNTER — Telehealth: Payer: Self-pay | Admitting: Oncology

## 2020-08-03 ENCOUNTER — Other Ambulatory Visit: Payer: Self-pay | Admitting: *Deleted

## 2020-08-03 ENCOUNTER — Inpatient Hospital Stay: Payer: 59

## 2020-08-03 ENCOUNTER — Inpatient Hospital Stay: Payer: 59 | Attending: Oncology

## 2020-08-03 ENCOUNTER — Telehealth: Payer: Self-pay

## 2020-08-03 ENCOUNTER — Inpatient Hospital Stay (INDEPENDENT_AMBULATORY_CARE_PROVIDER_SITE_OTHER): Payer: 59 | Admitting: Oncology

## 2020-08-03 VITALS — BP 84/52 | HR 81 | Temp 98.4°F | Resp 18 | Ht 62.99 in

## 2020-08-03 DIAGNOSIS — G939 Disorder of brain, unspecified: Secondary | ICD-10-CM | POA: Diagnosis not present

## 2020-08-03 DIAGNOSIS — I745 Embolism and thrombosis of iliac artery: Secondary | ICD-10-CM | POA: Insufficient documentation

## 2020-08-03 DIAGNOSIS — E860 Dehydration: Secondary | ICD-10-CM | POA: Insufficient documentation

## 2020-08-03 DIAGNOSIS — Z8673 Personal history of transient ischemic attack (TIA), and cerebral infarction without residual deficits: Secondary | ICD-10-CM | POA: Insufficient documentation

## 2020-08-03 DIAGNOSIS — I4891 Unspecified atrial fibrillation: Secondary | ICD-10-CM | POA: Insufficient documentation

## 2020-08-03 DIAGNOSIS — R590 Localized enlarged lymph nodes: Secondary | ICD-10-CM | POA: Insufficient documentation

## 2020-08-03 DIAGNOSIS — C4359 Malignant melanoma of other part of trunk: Secondary | ICD-10-CM

## 2020-08-03 DIAGNOSIS — Z79899 Other long term (current) drug therapy: Secondary | ICD-10-CM | POA: Insufficient documentation

## 2020-08-03 DIAGNOSIS — J189 Pneumonia, unspecified organism: Secondary | ICD-10-CM | POA: Insufficient documentation

## 2020-08-03 DIAGNOSIS — C439 Malignant melanoma of skin, unspecified: Secondary | ICD-10-CM | POA: Diagnosis present

## 2020-08-03 DIAGNOSIS — C792 Secondary malignant neoplasm of skin: Secondary | ICD-10-CM | POA: Diagnosis not present

## 2020-08-03 DIAGNOSIS — I959 Hypotension, unspecified: Secondary | ICD-10-CM | POA: Diagnosis not present

## 2020-08-03 DIAGNOSIS — R112 Nausea with vomiting, unspecified: Secondary | ICD-10-CM

## 2020-08-03 LAB — HEPATIC FUNCTION PANEL
ALT: 23 (ref 10–40)
AST: 34 (ref 14–40)
Alkaline Phosphatase: 378 — AB (ref 25–125)
Bilirubin, Total: 0.4

## 2020-08-03 LAB — CORRECTED CALCIUM (CC13): Calcium, Corrected: 11.8 — AB (ref 8.4–10.2)

## 2020-08-03 LAB — BASIC METABOLIC PANEL
BUN: 20 (ref 4–21)
CO2: 25 — AB (ref 13–22)
Chloride: 95 — AB (ref 99–108)
Creatinine: 0.8 (ref 0.6–1.3)
Glucose: 122
Potassium: 4 (ref 3.4–5.3)
Sodium: 128 — AB (ref 137–147)

## 2020-08-03 LAB — CBC AND DIFFERENTIAL
HCT: 29 — AB (ref 41–53)
Hemoglobin: 9.5 — AB (ref 13.5–17.5)
Neutrophils Absolute: 6
Platelets: 429 — AB (ref 150–399)
WBC: 7.9

## 2020-08-03 LAB — TSH: TSH: 2.567 u[IU]/mL (ref 0.350–4.500)

## 2020-08-03 LAB — COMPREHENSIVE METABOLIC PANEL
Albumin: 2.9 — AB (ref 3.5–5.0)
Calcium: 10.7 (ref 8.7–10.7)

## 2020-08-03 LAB — CBC
MCV: 87 (ref 80–94)
RBC: 3.27 — AB (ref 3.87–5.11)

## 2020-08-03 MED ORDER — OXYCODONE HCL 10 MG PO TABS: 10.0000 mg | ORAL_TABLET | ORAL | 0 refills | Status: AC | PRN

## 2020-08-03 MED ORDER — SODIUM CHLORIDE 0.9 % IV SOLN
Freq: Once | INTRAVENOUS | Status: AC
  Filled 2020-08-03: qty 250

## 2020-08-03 MED ORDER — ZOLEDRONIC ACID 4 MG/100ML IV SOLN
INTRAVENOUS | Status: AC
  Filled 2020-08-03: qty 100

## 2020-08-03 MED ORDER — HEPARIN SOD (PORK) LOCK FLUSH 100 UNIT/ML IV SOLN
500.0000 [IU] | Freq: Once | INTRAVENOUS | Status: AC | PRN
  Administered 2020-08-03: 500 [IU]
  Filled 2020-08-03: qty 5

## 2020-08-03 MED ORDER — ZOLEDRONIC ACID 4 MG/100ML IV SOLN
4.0000 mg | Freq: Once | INTRAVENOUS | Status: AC
  Administered 2020-08-03: 4 mg via INTRAVENOUS

## 2020-08-03 NOTE — Telephone Encounter (Signed)
08/03/20 Spoke with wife and gave next appt

## 2020-08-03 NOTE — Patient Outreach (Signed)
[facility]) Care Management  08/03/2020  [place]. September 21, 1960 [id number]   RED ON EMMI ALERT - Stroke Day # 1 Date: 3/2 Red Alert Reason: Feeling worse and New or Worsening pain/fever/shortness of breath   Outreach attempt #1, successful to wife.  Identity verified.  This care manager introduced self and stated purpose of call.  [facility] care management services explained.    Wife report member is currently at the infusion center, blood levels were low.  State he has not had fever or shortness of breath but has been experiencing worsening pain.  He has metastatic cancer and the axilla tumor has increased in size.  Member has been seen by oncologist and plan is to do more chemo/radiation therapy in attempt to decrease tumor and pain.  They are meeting with radiology oncologist tomorrow to discuss when to start sessions.  He is on pain medications, minimal relief.  Will discuss further with MD if meds need to be changed.  He has not been able to use his walker due to the pain and size of the tumor, now needing wheelchair.  There was one ordered prior to discharge but she report it would need to be rented instead of insurance paying for it.  She has been told that the insurance company should now pay for it since he will have to use it long term instead of short term.  Advised that MD office will have to order, however she state she was told that the home health team would need to do it.  Confirms that nursing services have started, PT/OT has not.  Call placed to [facility] to inquire about need for wheelchair and PT/OT.  Notified that PT will be reaching out to member today to schedule home visit for tomorrow, they will be able to discuss order for wheelchair at that time and have MD place order.  Call placed back to wife, update provided.  She verbalizes understanding, denies any urgent needs at this time.    Plan: RN CM will send outreach letter with this care manager's  contact information and follow up within the next week.  Valente David, [place], MSN [place] [phone]

## 2020-08-03 NOTE — Progress Notes (Signed)
The patient was seen in the office today for low blood pressure and hypercalcemia. The patient came in via wheelchair. The patient's blood pressures have been running extremely low. 1134 BP 80/57 sitting initially. After 1.5 liters the BP supine = 83/47  BP sitting = 89/54. Called [facility] and obtained orders for another 551ml normal saline. The patient was made and aware of his low blood pressure and agreed to the extra fluids. At 1520 the patient blood pressure supine = 79/57 and sitting blood pressure = 86/57. Informed Dr. Hinton Rao again and she was ok with discharging the patient.

## 2020-08-03 NOTE — Telephone Encounter (Signed)
Spoke with patient's wife [name] regarding scheduling a Palliative care consult appointment with Latoya. Offered [name] an appointment for 3/23 for consult (first available) without moving other patient's appointment. [name] then started yelling saying that is 3 weeks away. I then asked her if they had any urgent needs and she yelled "yes he is dying, taking chemo, and getting radiation" I tried to explain that I could possibly move another patient if he had urgent needs so he could be seen sooner. She just kept yelling that is 3 weeks away, and when he was at the hospital they told her someone would be out to see him in 1-2 days. She also yelled at me and said he don't need me or my service. She was going to call someone that will see him now and he will be their first priority. I attempted to apologize to the wife, but she hung the phone up on me.

## 2020-08-03 NOTE — Patient Instructions (Signed)
Dehydration, Adult Dehydration is condition in which there is not enough water or other fluids in the body. This happens when a person loses more fluids than he or she takes in. Important body parts cannot work right without the right amount of fluids. Any loss of fluids from the body can cause dehydration. Dehydration can be mild, worse, or very bad. It should be treated right away to keep it from getting very bad. What are the causes? This condition may be caused by:  Conditions that cause loss of water or other fluids, such as: ? Watery poop (diarrhea). ? Vomiting. ? Sweating a lot. ? Peeing (urinating) a lot.  Not drinking enough fluids, especially when you: ? Are ill. ? Are doing things that take a lot of energy to do.  Other illnesses and conditions, such as fever or infection.  Certain medicines, such as medicines that take extra fluid out of the body (diuretics).  Lack of safe drinking water.  Not being able to get enough water and food. What increases the risk? The following factors may make you more likely to develop this condition:  Having a long-term (chronic) illness that has not been treated the right way, such as: ? Diabetes. ? Heart disease. ? Kidney disease.  Being 65 years of age or older.  Having a disability.  Living in a place that is high above the ground or sea (high in altitude). The thinner, dried air causes more fluid loss.  Doing exercises that put stress on your body for a long time. What are the signs or symptoms? Symptoms of dehydration depend on how bad it is. Mild or worse dehydration  Thirst.  Dry lips or dry mouth.  Feeling dizzy or light-headed, especially when you stand up from sitting.  Muscle cramps.  Your body making: ? Dark pee (urine). Pee may be the color of tea. ? Less pee than normal. ? Less tears than normal.  Headache. Very bad dehydration  Changes in skin. Skin may: ? Be cold to the touch (clammy). ? Be blotchy  or pale. ? Not go back to normal right after you lightly pinch it and let it go.  Little or no tears, pee, or sweat.  Changes in vital signs, such as: ? Fast breathing. ? Low blood pressure. ? Weak pulse. ? Pulse that is more than 100 beats a minute when you are sitting still.  Other changes, such as: ? Feeling very thirsty. ? Eyes that look hollow (sunken). ? Cold hands and feet. ? Being mixed up (confused). ? Being very tired (lethargic) or having trouble waking from sleep. ? Short-term weight loss. ? Loss of consciousness. How is this treated? Treatment for this condition depends on how bad it is. Treatment should start right away. Do not wait until your condition gets very bad. Very bad dehydration is an emergency. You will need to go to a hospital.  Mild or worse dehydration can be treated at home. You may be asked to: ? Drink more fluids. ? Drink an oral rehydration solution (ORS). This drink helps get the right amounts of fluids and salts and minerals in the blood (electrolytes).  Very bad dehydration can be treated: ? With fluids through an IV tube. ? By getting normal levels of salts and minerals in your blood. This is often done by giving salts and minerals through a tube. The tube is passed through your nose and into your stomach. ? By treating the root cause. Follow these instructions at   home: Oral rehydration solution If told by your doctor, drink an ORS:  Make an ORS. Use instructions on the package.  Start by drinking small amounts, about  cup (120 mL) every 5-10 minutes.  Slowly drink more until you have had the amount that your doctor said to have. Eating and drinking  Drink enough clear fluid to keep your pee pale yellow. If you were told to drink an ORS, finish the ORS first. Then, start slowly drinking other clear fluids. Drink fluids such as: ? Water. Do not drink only water. Doing that can make the salt (sodium) level in your body get too low. ? Water  from ice chips you suck on. ? Fruit juice that you have added water to (diluted). ? Low-calorie sports drinks.  Eat foods that have the right amounts of salts and minerals, such as: ? Bananas. ? Oranges. ? Potatoes. ? Tomatoes. ? Spinach.  Do not drink alcohol.  Avoid: ? Drinks that have a lot of sugar. These include:  High-calorie sports drinks.  Fruit juice that you did not add water to.  Soda.  Caffeine. ? Foods that are greasy or have a lot of fat or sugar.         General instructions  Take over-the-counter and prescription medicines only as told by your doctor.  Do not take salt tablets. Doing that can make the salt level in your body get too high.  Return to your normal activities as told by your doctor. Ask your doctor what activities are safe for you.  Keep all follow-up visits as told by your doctor. This is important. Contact a doctor if:  You have pain in your belly (abdomen) and the pain: ? Gets worse. ? Stays in one place.  You have a rash.  You have a stiff neck.  You get angry or annoyed (irritable) more easily than normal.  You are more tired or have a harder time waking than normal.  You feel: ? Weak or dizzy. ? Very thirsty. Get help right away if you have:  Any symptoms of very bad dehydration.  Symptoms of vomiting, such as: ? You cannot eat or drink without vomiting. ? Your vomiting gets worse or does not go away. ? Your vomit has blood or green stuff in it.  Symptoms that get worse with treatment.  A fever.  A very bad headache.  Problems with peeing or pooping (having a bowel movement), such as: ? Watery poop that gets worse or does not go away. ? Blood in your poop (stool). This may cause poop to look black and tarry. ? Not peeing in 6-8 hours. ? Peeing only a small amount of very dark pee in 6-8 hours.  Trouble breathing. These symptoms may be an emergency. Do not wait to see if the symptoms will go away. Get  medical help right away. Call your local emergency services (911 in the [place] Do not drive yourself to the hospital. Summary  Dehydration is a condition in which there is not enough water or other fluids in the body. This happens when a person loses more fluids than he or she takes in.  Treatment for this condition depends on how bad it is. Treatment should be started right away. Do not wait until your condition gets very bad.  Drink enough clear fluid to keep your pee pale yellow. If you were told to drink an oral rehydration solution (ORS), finish the ORS first. Then, start slowly drinking other clear fluids.    Take over-the-counter and prescription medicines only as told by your doctor.  Get help right away if you have any symptoms of very bad dehydration. This information is not intended to replace advice given to you by your health care provider. Make sure you discuss any questions you have with your health care provider. Document Revised: 12/31/2018 Document Reviewed: 12/31/2018 Elsevier Patient Education  2021 [employer]  

## 2020-08-04 ENCOUNTER — Telehealth: Payer: Self-pay

## 2020-08-04 ENCOUNTER — Other Ambulatory Visit: Payer: Self-pay | Admitting: Hematology and Oncology

## 2020-08-04 LAB — T4: T4, Total: 4.8 ug/dL (ref 4.5–12.0)

## 2020-08-04 NOTE — Telephone Encounter (Signed)
Pt's wife, [name], called & LVM on nurse triage line that JC's blood pressure is 88/42. The home health nurse was there to change wound vac dressing and she took his BP. She states, "This is happening so fast. He is hallucinating. His breathing is shallow". She is requesting a call back.   I called [name] back. She is tearful and states she, "thinks it is getting close to time. It has happened so fast. His eyes are rolling back. I don't want him to suffer". I asked her if JC had a Do not resuscitate order. She answered, "No, but I am his healthcare power of attorney. I don't want him to suffer". I told her at this point if she is uncomfortable keeping him @ home alone, she needed to call 911. I also asked if she had considered Hospice services. She is unsure. She is understandably tearful. She is alone in the home. I placed her on hold for a moment, to discuss above with Melissa,NP.   Lenna Sciara, NP, spoke with [name].

## 2020-08-04 NOTE — Progress Notes (Signed)
I spoke with the patient's wife by phone. She reports that he is breathing very shallow and not as responsive as he was yesterday. He was seen in the radiation department today for radiation planning. He was also evaluated by Dr. Hinton Rao on Wednesday. The wife reiterated today that they are not ready for Hospice. She states she is "scared and doesn't know what to do". I explained to her that this is concerning for deterioration related to his disease. She verbalized that he is a "full code" right now. I advised her that she could call EMS for evaluation and that they would most likely want to transport him to the ED. I explained to her that he would be evaluated and the physicians would discuss with her about the patient's wishes for treatment and resuscitation. She verbalized understanding. I advised that we will be available to her as she needs.

## 2020-08-04 NOTE — Telephone Encounter (Signed)
error 

## 2020-08-07 ENCOUNTER — Telehealth: Payer: Self-pay

## 2020-08-07 ENCOUNTER — Inpatient Hospital Stay: Payer: 59

## 2020-08-07 ENCOUNTER — Other Ambulatory Visit: Payer: Self-pay | Admitting: *Deleted

## 2020-08-07 ENCOUNTER — Inpatient Hospital Stay: Payer: 59 | Admitting: Hematology and Oncology

## 2020-08-07 NOTE — Telephone Encounter (Addendum)
I called and spoke with Ebony Hail, notified her of below. She states they will get out there today.   ----- Message from Derwood Kaplan, MD sent at 08/07/2020 10:18 AM EST ----- Regarding: RE: HOSPICE eval Yes, I definitely rec hospice and have discussed with them,  he has had too many complications. 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 Then developed wound at one of the sites and now has wound vac Then had a stroke and was adm to [facility] We haven't treated the brain but he failed aggressive immunotherapy Latest is severe pain left axillary adenopathy, treated with partial XRT, was planning more for palliation Wife called me yest that he was vomiting blood mixed in and I rec ER I would like to put him on omeprazole or similar agent for prob gastritis She described mult other symptoms, incl cough, not eating, hallucinations Wife is good caregiver, this has just been overwhelming for both of them Thank you, I will be glad to be attending ----- Message ----- From: Dairl Ponder, RN Sent: 08/07/2020  10:01 AM EST To: Derwood Kaplan, MD Subject: Sophronia Simas                                   Received call from [facility]. She reports that pt's wife reached out to them over the weekend requesting Hospice evaluation. Ebony Hail wants to confirm that is appropriate and if you would prefer to remain attending.  [phone]

## 2020-08-07 NOTE — Patient Outreach (Signed)
[facility]) Care Management  08/07/2020  Robbins. 12/09/1960 [id number]   RED ON EMMI ALERT - Stroke Day # 3 Date: 3/4 Red Alert Reason: Feeling worse overall   Outreach attempt #1, successful to wife.  She report member's condition has declined drastically since last outreach on 3/3.  She have reached out to oncologist and hospice, requesting evaluation for hospice program.  Denies any needs at this time, hospice nurse arriving currently for home assessment.     Plan: RN CM will close case at this time, no further needs identified.  Valente David, [place], MSN [place] [phone]

## 2020-08-07 NOTE — Telephone Encounter (Signed)
Received call from [facility]. She reports that pt's wife reached out to them over the weekend requesting Hospice evaluation. Ebony Hail wants to confirm that is appropriate and if you would prefer to remain attending.  [phone]

## 2020-08-08 ENCOUNTER — Inpatient Hospital Stay: Payer: 59

## 2020-08-10 ENCOUNTER — Ambulatory Visit: Payer: Self-pay | Admitting: *Deleted

## 2020-08-16 ENCOUNTER — Ambulatory Visit: Payer: 59 | Admitting: Oncology

## 2020-08-16 ENCOUNTER — Ambulatory Visit: Payer: 59

## 2020-08-16 ENCOUNTER — Other Ambulatory Visit: Payer: 59

## 2020-08-22 ENCOUNTER — Ambulatory Visit: Payer: 59 | Admitting: Cardiology

## 2020-09-01 DEATH — deceased

## 2022-12-25 IMAGING — DX DG CHEST 1V PORT
1 series · 1 of 1 positions shown · non-contrast
Comparison: 07/04/2020

CLINICAL DATA: Weakness

EXAM:
PORTABLE CHEST 1 VIEW

[chest ap]
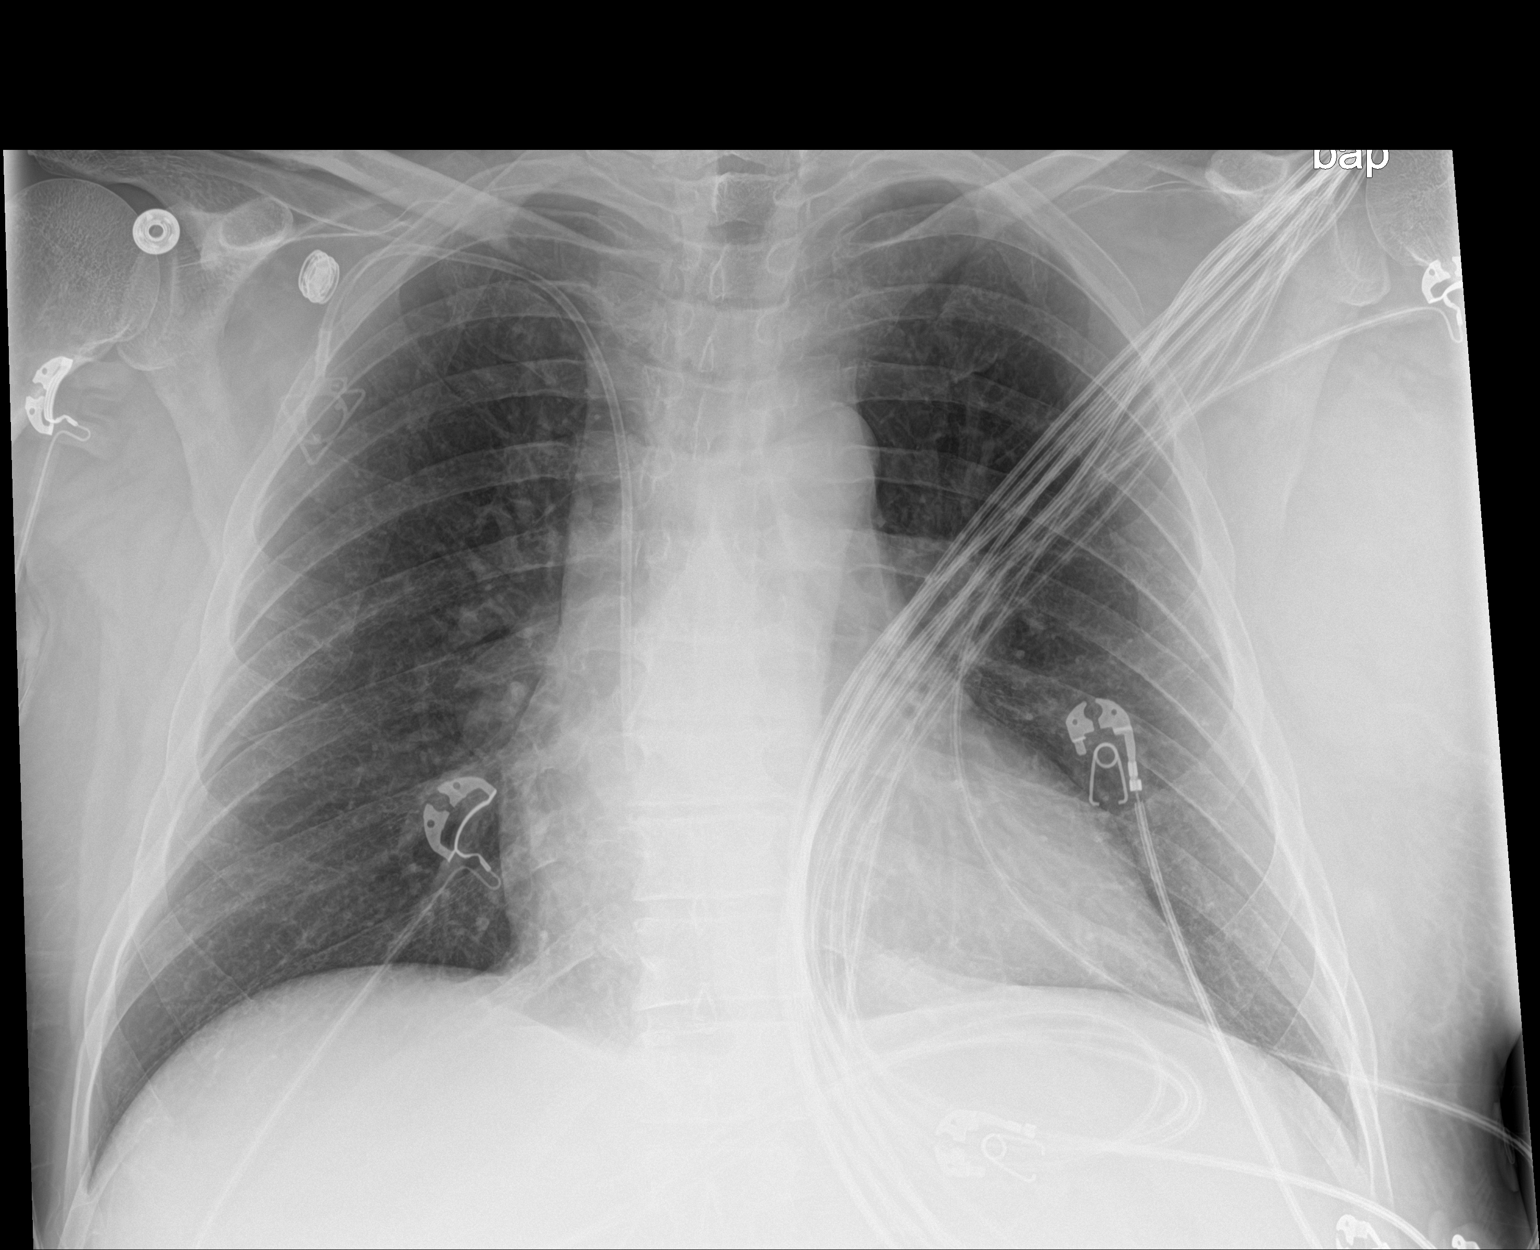

[1 of 1 positions shown; findings below may reference images not displayed]

FINDINGS: There is cardiomegaly. The previously demonstrated axillary and
hilar adenopathy is better visualized on prior CT scans. Both lungs
are clear. The visualized skeletal structures are unremarkable.
There is a well-positioned right-sided Port-A-Cath.
IMPRESSION: No definite acute cardiopulmonary process.

## 2022-12-27 IMAGING — CT CT ANGIO AOBIFEM WO/W CM
2 of 7 series · 6 of 16 positions shown, 8 images · IV contrast (omnipaque)
Comparison: 07/04/2020, 07/22/2020

CLINICAL DATA: 59-year-old male with arterial embolism into lower
leg.

EXAM:
CT ANGIOGRAPHY OF ABDOMINAL AORTA WITH ILIOFEMORAL RUNOFF
TECHNIQUE: Multidetector CT imaging of the abdomen, pelvis and lower
extremities was performed using the standard protocol during bolus
administration of intravenous contrast. Multiplanar CT image
reconstructions and MIPs were obtained to evaluate the vascular
anatomy.
CONTRAST:  99mL OMNIPAQUE IOHEXOL 350 MG/ML SOLN

[Series 5: sag lower · sagittal · 0.49mm/px · 1 of 119 slices shown]
[im 60/119  soft-tissue]
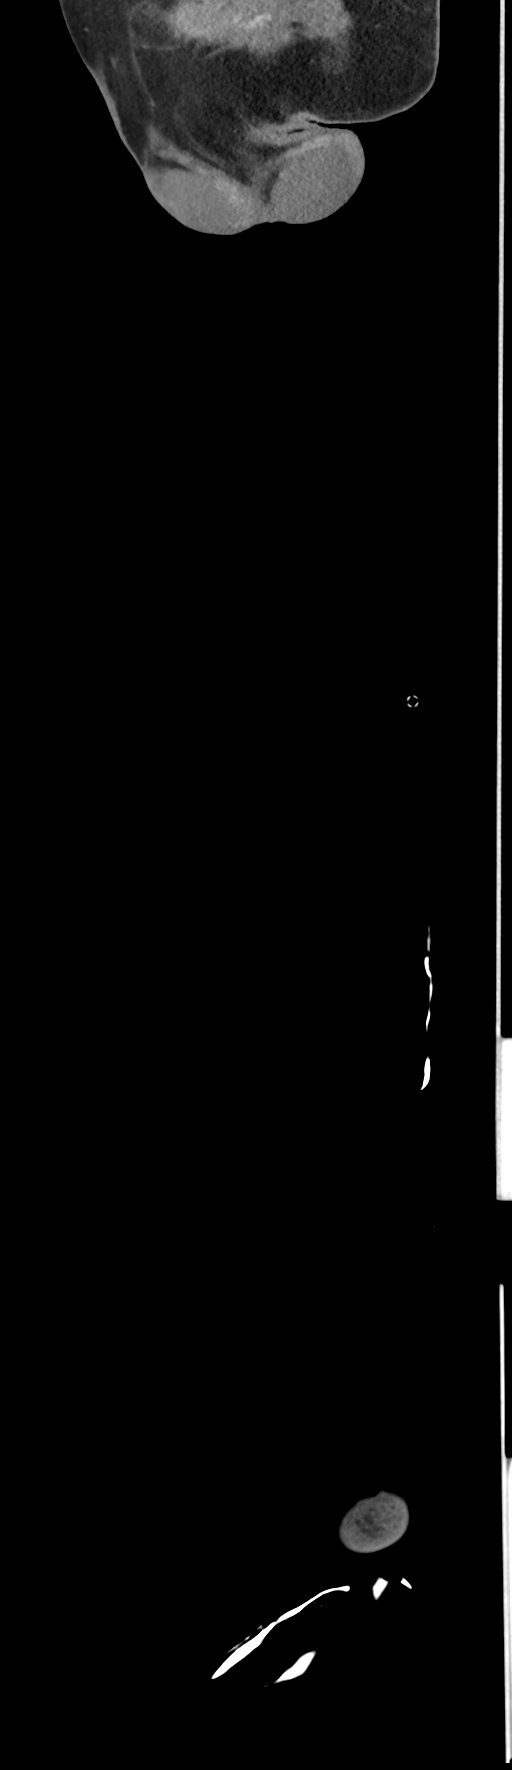

[Series 11: cta runoff (id) · axial · 0.89mm/px · z∈[+196,+1054]mm · 5 of 430 slices shown, 7 images]
[im 72/430  soft-tissue]
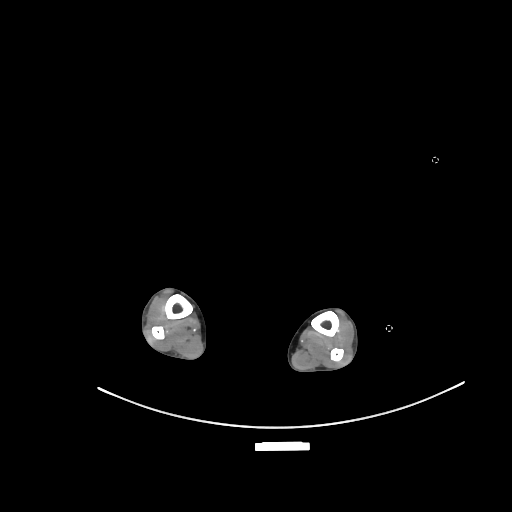
[im 72/430  bone]
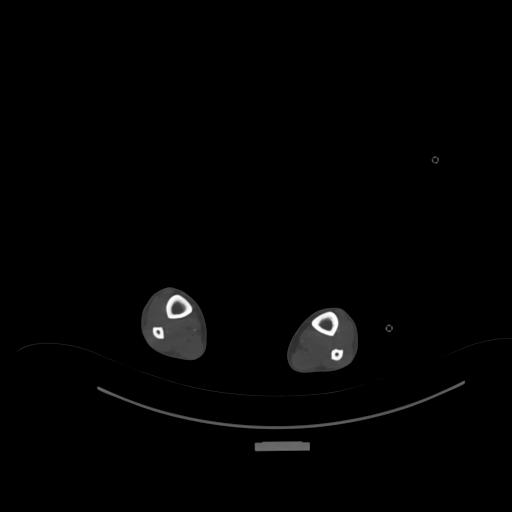
[im 144/430  soft-tissue]
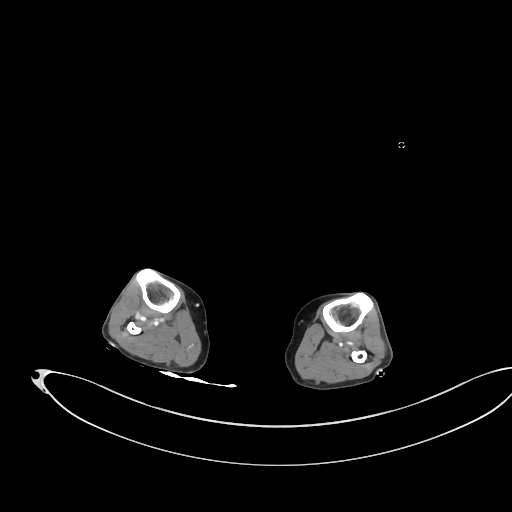
[im 215/430  soft-tissue]
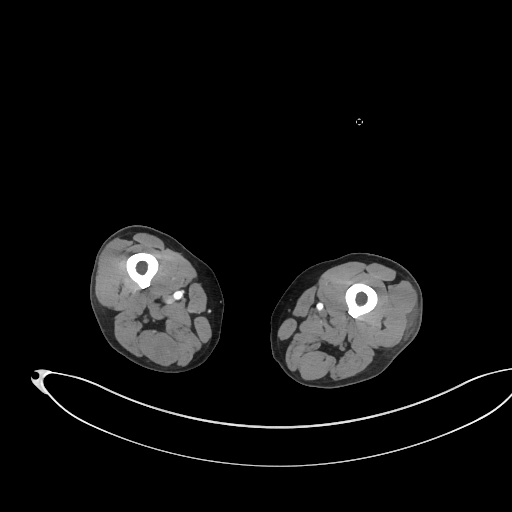
[im 287/430  soft-tissue]
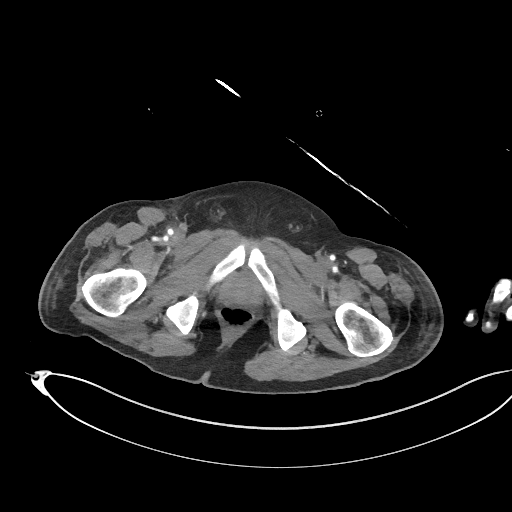
[im 358/430  soft-tissue]
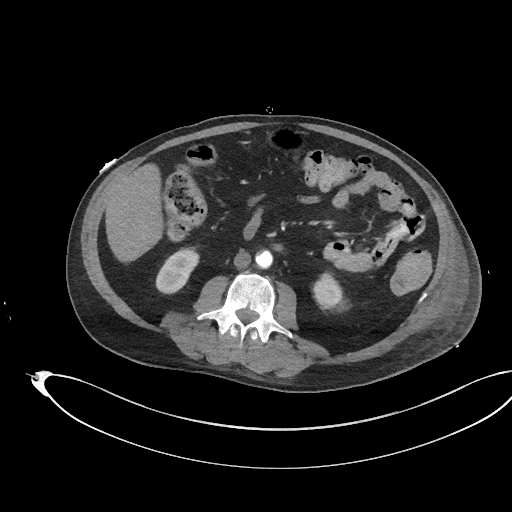
[im 358/430  bone]
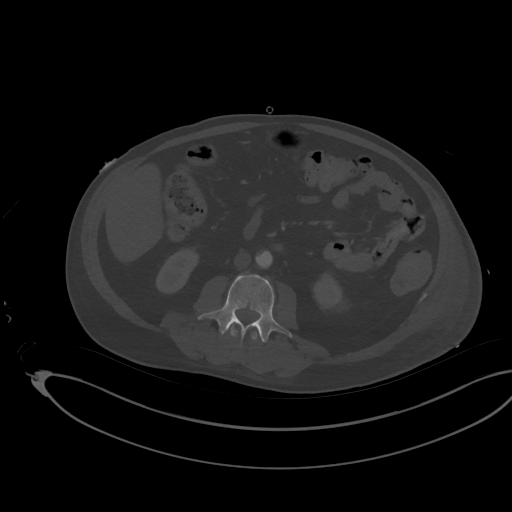

[6 of 16 positions shown; findings below may reference images not displayed]

FINDINGS: VASCULAR

Aorta: The aorta is normal in caliber and patent throughout. There
is increased burden of wall adherent mural thrombus in the
infrarenal aorta resulting in approximately 40% stenosis the distal
aorta.

Celiac: Mild ostial stenosis likely secondary to median arcuate
ligament compression with mild poststenotic dilation. Otherwise
widely patent.

SMA: Patent without evidence of aneurysm, dissection, vasculitis or
significant stenosis.

Renals: Single bilateral renal arteries are patent without evidence
of aneurysm, dissection, vasculitis, fibromuscular dysplasia or
significant stenosis.

IMA: Patent without evidence of aneurysm, dissection, vasculitis or
significant stenosis.

RIGHT Lower Extremity

Inflow: Right common iliac stent in place which is widely patent,
extending from the aortic bifurcation to the proximal external iliac
artery. The internal iliac arteries covered, therefore occluded. The
remaining external iliac and common femoral artery patent.

Outflow: Common, superficial and profunda femoral arteries and the
popliteal artery are patent without evidence of aneurysm,
dissection, vasculitis or significant stenosis.

Runoff: Patent trifurcation. Abrupt occlusion of the proximal
peroneal artery. Abrupt occlusion of the distal anterior tibial
artery. Diminutive appearance of the posterior tibial artery at the
level of the ankle.

LEFT Lower Extremity

Inflow: Occlusion of the indwelling left common iliac stent. Patent
internal and external iliac arteries.

Outflow: Occlusion of the left distal common femoral artery
extending into the proximal profunda and superficial femoral artery.
Otherwise widely patent superficial femoral artery and popliteal
artery.

Runoff: Patent trifurcation. The peroneal artery is diminutive
distally. Inline flow to the foot via the anterior tibial posterior
tibial arteries.

Veins: No obvious venous abnormality within the limitations of this
arterial phase study.

Review of the MIP images confirms the above findings.

NON-VASCULAR

Lower chest: Lingular mosaic attenuation. Left basilar subsegmental
atelectasis. Scattered nodular opacities in the lingula, the largest
measuring up to 1.1 cm. Multifocal scattered punctate solid and
ground-glass nodules throughout the visualized right lung base.

Hepatobiliary: No focal liver abnormality is seen. No gallstones,
gallbladder wall thickening, or biliary dilatation.

Pancreas: Unremarkable. No pancreatic ductal dilatation or
surrounding inflammatory changes.

Spleen: Normal in size without focal abnormality.

Adrenals/Urinary Tract: Adrenal glands are unremarkable. Soft tissue
density exophytic lesion arising from the medial aspect of the right
kidney superior pole, better visualized the comparison study.
Kidneys are otherwise normal, without renal calculi, focal lesion,
or hydronephrosis. Bladder is unremarkable.

Stomach/Bowel: Stomach is within normal limits. Appendix appears
normal. No evidence of bowel wall thickening, distention, or
inflammatory changes.

Lymphatic: No abdominopelvic lymphadenopathy.

Reproductive: Prostate is unremarkable.

Other: Expected postsurgical changes after right common femoral
artery cutdown with minimal fluid about the subcutaneous incision.

Musculoskeletal: Significant interval progression of previously
visualized soft tissue nodularity and multilobular mass like
formation, incompletely visualized, in the left axilla with
associated worsening left lateral abdominal in thoracic anasarca
extending into the left thigh. No acute osseous abnormality or
aggressive appearing osseous lesions. Postsurgical changes after
right lower leg medial and lateral compartment fasciotomies without
complicating features.
IMPRESSION: VASCULAR

1. Acute occlusion of the indwelling left common iliac stent.
2. Increased burden of mural thrombus in the distal abdominal aorta
resulting in up to approximately 40% stenosis just proximal to the
indwelling kissing iliac stents.
3. Acute occlusion of the distal left common femoral artery
extending into the proximal profunda and superficial femoral artery.
4. Embolic occlusion of the left proximal peroneal and distal
anterior tibial arteries.

NON-VASCULAR

1. Significant interval progression in size of partially visualized
left axillary lobular soft tissue mass with associated left trunk
anasarca.
2. Scattered subcentimeter solid and ground-glass nodules in the
visualized lung bases, largest in the lingula, concerning for
metastatic disease.
3. Postsurgical changes after right femoral cutdown and right lower
extremity mediolateral compartment fasciotomies without complicating
features.
4. Indeterminate soft tissue density 1 cm exophytic right superior
pole mass. Hemorrhagic cyst is most likely, however, metastasis or
synchronous primary neoplasm could appear similarly.

These results were called by telephone at the time of interpretation
on 07/25/2020 at [DATE] to provider Dr. Cadii Dibb, Who verbally
acknowledged these results.
